# Patient Record
Sex: Female | Born: 2000 | Race: Black or African American | Hispanic: No | Marital: Single | Smoking: Never smoker
Health system: Southern US, Community
[De-identification: ages and names within clinical notes are randomized; demographics above are authoritative.]

## PROBLEM LIST (undated history)

## (undated) DIAGNOSIS — E079 Disorder of thyroid, unspecified: Secondary | ICD-10-CM

## (undated) DIAGNOSIS — D649 Anemia, unspecified: Secondary | ICD-10-CM

## (undated) DIAGNOSIS — R7303 Prediabetes: Secondary | ICD-10-CM

## (undated) DIAGNOSIS — M609 Myositis, unspecified: Secondary | ICD-10-CM

## (undated) DIAGNOSIS — M6282 Rhabdomyolysis: Secondary | ICD-10-CM

## (undated) DIAGNOSIS — J45909 Unspecified asthma, uncomplicated: Secondary | ICD-10-CM

## (undated) HISTORY — DX: Disorder of thyroid, unspecified: E07.9

## (undated) HISTORY — DX: Unspecified asthma, uncomplicated: J45.909

## (undated) HISTORY — DX: Myositis, unspecified: M60.9

## (undated) HISTORY — DX: Prediabetes: R73.03

---
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
## 2019-05-11 DIAGNOSIS — Q6689 Other  specified congenital deformities of feet: Secondary | ICD-10-CM | POA: Insufficient documentation

## 2019-06-28 HISTORY — PX: FOOT SURGERY: SHX648

## 2019-10-12 ENCOUNTER — Other Ambulatory Visit: Payer: Self-pay

## 2019-10-12 ENCOUNTER — Encounter (HOSPITAL_COMMUNITY): Payer: Self-pay | Admitting: Emergency Medicine

## 2019-10-12 ENCOUNTER — Emergency Department (HOSPITAL_COMMUNITY)
Admission: EM | Admit: 2019-10-12 | Discharge: 2019-10-13 | Disposition: A | Discharge status: Home / Self Care | Payer: BLUE CROSS/BLUE SHIELD | Attending: Emergency Medicine | Admitting: Emergency Medicine

## 2019-10-12 DIAGNOSIS — H9202 Otalgia, left ear: Secondary | ICD-10-CM | POA: Diagnosis not present

## 2019-10-12 DIAGNOSIS — Z5321 Procedure and treatment not carried out due to patient leaving prior to being seen by health care provider: Secondary | ICD-10-CM | POA: Insufficient documentation

## 2019-10-12 NOTE — ED Triage Notes (Signed)
Pt c/o left ear pain x 1 week. Pt states she thinks it's an ear infection or sinus congestion. Pt states she took sinus medication with no relief. Describes pain as sharp and radiates down into teeth.

## 2019-10-13 NOTE — ED Notes (Signed)
Eloped from waiting area. Called 3X.  

## 2020-02-07 ENCOUNTER — Other Ambulatory Visit: Payer: BLUE CROSS/BLUE SHIELD

## 2020-02-07 DIAGNOSIS — Z20822 Contact with and (suspected) exposure to covid-19: Secondary | ICD-10-CM

## 2020-02-09 LAB — SARS-COV-2, NAA 2 DAY TAT

## 2020-02-09 LAB — NOVEL CORONAVIRUS, NAA: SARS-CoV-2, NAA: NOT DETECTED

## 2020-03-29 NOTE — L&D Delivery Note (Signed)
OB/[facility]N Faculty Practice Delivery Note  Rachael Patton is a 20 y.o. G1P0 s/p SVD at [redacted]w[redacted]d. She was admitted for induction of labor for T2DM on insulin.   ROM: 4h 75m with clear fluid GBS Status: GBS positive- adequately treated with PCN Maximum Maternal Temperature: 98.4 degrees Fahrenheit  Labor Progress: Patient SROM'd at 1430, and progressed to 5/90/-1. She then got an epidural and on repeat check found to be 9.5/90/0 station. She was allowed to labor down and then found to be complete and started pushing. She was noted to have several mild range blood pressures after epidural, and pre-E labs were ordered.  Delivery Date/Time: 03/16/21 at 1849 Delivery: Called to room and patient was complete and pushing. Head delivered LOA. Loose nuchal cord present and delivered through. Shoulder and body delivered in usual fashion. Infant with spontaneous cry, placed on mother's abdomen, dried and stimulated. Upon delivery of infant, left extremity noted to have knee and foot facing dorsally. Discussed with parents to discuss with pediatrician and may consider peds orthopedic consult. Cord clamped x 2 after 1-minute delay, and cut by father of baby under my direct supervision. Cord blood drawn. Placenta delivered spontaneously with gentle cord traction. Fundus firm with massage and Pitocin. Labia, perineum, vagina, and cervix were inspected, second degree perineal repair noted and repaired in the normal fashion with a 3-0 Vicryl.   Placenta: complete, three vessel cord appreciated Complications: none Lacerations: 2nd degree perineal tear, repaired EBL: 250 mL Analgesia: epidural  Postpartum Planning [x]  message to sent to schedule follow-up  [ ]  vaccines UTD  Infant: viable female infant   APGARs 9,9 at 1 and 5 minutes respectively   weight pending  , [facility]

## 2020-04-30 ENCOUNTER — Ambulatory Visit (INDEPENDENT_AMBULATORY_CARE_PROVIDER_SITE_OTHER): Payer: BLUE CROSS/BLUE SHIELD | Admitting: Physician Assistant

## 2020-04-30 ENCOUNTER — Encounter: Payer: Self-pay | Admitting: Physician Assistant

## 2020-04-30 ENCOUNTER — Other Ambulatory Visit: Payer: Self-pay

## 2020-04-30 VITALS — BP 120/68 | HR 107 | Temp 98.5°F | Ht 65.0 in | Wt 264.5 lb

## 2020-04-30 DIAGNOSIS — R7989 Other specified abnormal findings of blood chemistry: Secondary | ICD-10-CM | POA: Insufficient documentation

## 2020-04-30 DIAGNOSIS — B159 Hepatitis A without hepatic coma: Secondary | ICD-10-CM

## 2020-04-30 DIAGNOSIS — R29898 Other symptoms and signs involving the musculoskeletal system: Secondary | ICD-10-CM | POA: Diagnosis not present

## 2020-04-30 DIAGNOSIS — B349 Viral infection, unspecified: Secondary | ICD-10-CM | POA: Diagnosis not present

## 2020-04-30 DIAGNOSIS — R21 Rash and other nonspecific skin eruption: Secondary | ICD-10-CM | POA: Diagnosis not present

## 2020-04-30 DIAGNOSIS — D509 Iron deficiency anemia, unspecified: Secondary | ICD-10-CM | POA: Insufficient documentation

## 2020-04-30 DIAGNOSIS — R7303 Prediabetes: Secondary | ICD-10-CM | POA: Insufficient documentation

## 2020-04-30 DIAGNOSIS — E039 Hypothyroidism, unspecified: Secondary | ICD-10-CM | POA: Insufficient documentation

## 2020-04-30 DIAGNOSIS — D8989 Other specified disorders involving the immune mechanism, not elsewhere classified: Secondary | ICD-10-CM

## 2020-04-30 MED ORDER — TRIAMCINOLONE ACETONIDE 0.1 % EX CREA: TOPICAL_CREAM | CUTANEOUS | 0 refills | Status: DC

## 2020-04-30 NOTE — Patient Instructions (Signed)
It was great to see you!  Update blood work today.  Trial the cream for your rash. If no improvement in 1-2 weeks, please come back and see me and we will do biopsy.  I will be in touch with your blood work and recommendations.  Avoid alcohol and any acetaminophen products.  Let's follow-up in 1-2 weeks, sooner if you have concerns.  Take care,  [name] PA-C

## 2020-04-30 NOTE — Progress Notes (Addendum)
Rachael Patton is a 20 y.o. female is here to establish care and discuss leg weakness.  I acted as a Neurosurgeon for [employer], PA-C Corky Mull, LPN   History of Present Illness:   Chief Complaint  Patient presents with   Establish Care   Extremity Weakness    HPI   Pt here to establish care today.  Leg weakness Pt c/o bilateral leg weakness x 2 weeks. Pt had COVID beginning of Jan. Denies numbness or tingling. Weakness in upper thighs. Appetite is back to normal from her recent GI illness. Denies trauma or falls.   Autoimmune disorder She tells me that when she was a young child/early teen she had positive autoimmune testing. She saw a rheumatologist and was told that she might have a unspecified autoimmune disorder. She is unable to really provide much more hx.   Recent GI illness Mid December 2021 she developed fatigue, diarrhea, sweats, dry cough. She went to urgent care in early January 2022 for evaluation. Abd xray normal. Stool studies negative. WBC was 15.9. AST was 211 and ALT was 171. Hepatitis A IgG and IgM antibody was reactive on her hepatitis panel. She was also COVID positive.  She reports that she was never told about potentially having Hep A.  GI symptoms have resolved. Endorses very rare alcohol intake. Was taking Alka Seltzer Cold and Flu (250 mg of acetaminophen) per tablet BID x 2 weeks. She has stopped this after COVID symptoms resolve.  Did have some follow-up labs done at endocrinology. 04/21/20 AST: 251 ALT: 176    Rash Has new b/l rash to inner arms. Area is not itchy or painful. Has hx of eczema but this is different. Has not tried anything for this. No areas are open or oozing. Getting worse with time.   Health Maintenance Due  Topic Date Due   Hepatitis C Screening  Never done   HIV Screening  Never done   TETANUS/TDAP  Never done   PAP-Cervical Cytology Screening  Never done   PAP SMEAR-Modifier  Never done    Past  Medical History:  Diagnosis Date   Prediabetes    Thyroid disease    found at age 20     Social History   Tobacco Use   Smoking status: Never Smoker   Smokeless tobacco: Never Used  Vaping Use   Vaping Use: Never used  Substance Use Topics   Alcohol use: Yes    Comment: once a month   Drug use: Never    Past Surgical History:  Procedure Laterality Date   FOOT SURGERY Left 06/2019    History reviewed. No pertinent family history.  PMHx, SurgHx, SocialHx, FamHx, Medications, and Allergies were reviewed in the Visit Navigator and updated as appropriate.   Patient Active Problem List   Diagnosis Date Noted   Morbid obesity (HCC) 04/30/2020   Prolactin increased 04/30/2020   Hypothyroidism 04/30/2020   Prediabetes 04/30/2020   Iron deficiency anemia 04/30/2020   Elevated LFTs 04/30/2020    Social History   Tobacco Use   Smoking status: Never Smoker   Smokeless tobacco: Never Used  Vaping Use   Vaping Use: Never used  Substance Use Topics   Alcohol use: Yes    Comment: once a month   Drug use: Never    Current Medications and Allergies:    Current Outpatient Medications:    ferrous gluconate (FERGON) 324 MG tablet, Take 324 mg by mouth every morning., Disp: , Rfl:  levothyroxine (SYNTHROID) 50 MCG tablet, Take 50 mcg by mouth daily before breakfast., Disp: , Rfl:    triamcinolone (KENALOG) 0.1 %, Apply to affected area 1-2 times daily, Disp: 30 g, Rfl: 0  No Known Allergies  Review of Systems   ROS Negative unless otherwise specified per HPI.  Vitals:   Vitals:   04/30/20 1434  BP: 120/68  Pulse: (!) 107  Temp: 98.5 F (36.9 C)  TempSrc: Temporal  SpO2: 96%  Weight: 264 lb 8 oz (120 kg)  Height: 5\' 5"  (1.651 m)     Body mass index is 44.02 kg/m.   Physical Exam:    Physical Exam Vitals and nursing note reviewed.  Constitutional:      General: She is not in acute distress.    Appearance: She is well-developed.  She is not ill-appearing, toxic-appearing or sickly-appearing.  Cardiovascular:     Rate and Rhythm: Normal rate and regular rhythm.     Pulses: Normal pulses.     Heart sounds: Normal heart sounds, S1 normal and S2 normal.     Comments: No LE edema Pulmonary:     Effort: Pulmonary effort is normal.     Breath sounds: Normal breath sounds.  Skin:    General: Skin is warm, dry and intact.     Comments: Bilateral inner arms with numerous scattered hyperpigmented papules  Neurological:     Mental Status: She is alert.     GCS: GCS eye subscore is 4. GCS verbal subscore is 5. GCS motor subscore is 6.  Psychiatric:        Mood and Affect: Mood and affect normal.        Speech: Speech normal.        Behavior: Behavior normal. Behavior is cooperative.    Results for orders placed or performed in visit on 04/30/20  Comprehensive metabolic panel  Result Value Ref Range   Sodium 135 135 - 145 mEq/L   Potassium 4.3 3.5 - 5.1 mEq/L   Chloride 103 96 - 112 mEq/L   CO2 29 19 - 32 mEq/L   Glucose, Bld 116 (H) 70 - 99 mg/dL   BUN 8 6 - 23 mg/dL   Creatinine, Ser 06/28/20 0.40 - 1.20 mg/dL   Total Bilirubin 0.4 0.2 - 1.2 mg/dL   Alkaline Phosphatase 64 39 - 117 U/L   AST 273 (H) 0 - 37 U/L   ALT 175 (H) 0 - 35 U/L   Total Protein 7.9 6.0 - 8.3 g/dL   Albumin 3.2 (L) 3.5 - 5.2 g/dL   GFR 9.50 932.67 mL/min   Calcium 8.9 8.4 - 10.5 mg/dL  CBC with Differential/Platelet  Result Value Ref Range   WBC 14.6 (H) 4.0 - 10.5 K/uL   RBC 4.47 3.87 - 5.11 Mil/uL   Hemoglobin 11.3 (L) 12.0 - 15.0 g/dL   HCT >12.45 (L) 80.9 - 98.3 %   MCV 79.5 78.0 - 100.0 fl   MCHC 31.6 30.0 - 36.0 g/dL   RDW 38.2 (H) 50.5 - 39.7 %   Platelets 362.0 150.0 - 400.0 K/uL   Neutrophils Relative % 78.2 (H) 43.0 - 77.0 %   Lymphocytes Relative 8.6 (L) 12.0 - 46.0 %   Monocytes Relative 4.5 3.0 - 12.0 %   Eosinophils Relative 8.1 (H) 0.0 - 5.0 %   Basophils Relative 0.6 0.0 - 3.0 %   Neutro Abs 11.4 (H) 1.4 - 7.7 K/uL    Lymphs Abs 1.3 0.7 - 4.0 K/uL  Monocytes Absolute 0.7 0.1 - 1.0 K/uL   Eosinophils Absolute 1.2 (H) 0.0 - 0.7 K/uL   Basophils Absolute 0.1 0.0 - 0.1 K/uL  Sedimentation rate  Result Value Ref Range   Sed Rate 60 (H) 0 - 20 mm/hr  C-reactive protein  Result Value Ref Range   CRP 5.1 0.5 - 20.0 mg/dL  Rheumatoid factor  Result Value Ref Range   Rhuematoid fact SerPl-aCnc <14 <14 IU/mL  CK (Creatine Kinase)  Result Value Ref Range   Total CK 9,940 (H) 7 - 177 U/L  Magnesium  Result Value Ref Range   Magnesium 2.0 1.5 - 2.5 mg/dL     Assessment and Plan:    Chiara was seen today for establish care and extremity weakness.  Diagnoses and all orders for this visit:  Elevated LFTs; Bilateral leg weakness; Viral illness Labs obtained and reviewed with patient, and my supervising MD, Jacquiline Doe. Due to significant elevation of CK, recommend ER evaluation and management for aggressive IVF resuscitation. Patient was contacted by our staff and is agreeable to going at this time. Will follow-up with patient once discharged for ongoing care. -     Comprehensive metabolic panel -     CBC with Differential/Platelet -     Sedimentation rate -     C-reactive protein -     Rheumatoid factor -     ANA -     CK (Creatine Kinase) -     Magnesium  Autoimmune disorder (HCC) Still awaiting all results. Likely refer to rheumatology for further evaluation after ER visit if indicated.  Rash Provided her with rx for topical triamcinolone to affected area. Consider biopsy if no improvement or if worsening.  Other orders -     triamcinolone (KENALOG) 0.1 %; Apply to affected area 1-2 times daily  CMA or LPN served as scribe during this visit. History, Physical, and Plan performed by medical provider. The above documentation has been reviewed and is accurate and complete.  Time spent with patient today was 50 minutes which consisted of chart review, discussing diagnosis, work up,  treatment answering questions and documentation.  [name], PA-C [place], [place] 05/01/2020  Follow-up: No follow-ups on file.

## 2020-05-01 ENCOUNTER — Encounter (HOSPITAL_COMMUNITY): Payer: Self-pay | Admitting: Emergency Medicine

## 2020-05-01 ENCOUNTER — Observation Stay (HOSPITAL_COMMUNITY): Payer: BLUE CROSS/BLUE SHIELD

## 2020-05-01 ENCOUNTER — Inpatient Hospital Stay (HOSPITAL_COMMUNITY)
Admission: EM | Admit: 2020-05-01 | Discharge: 2020-05-05 | DRG: 558 | Disposition: A | Discharge status: Home / Self Care | Payer: BLUE CROSS/BLUE SHIELD | Attending: Internal Medicine | Admitting: Internal Medicine

## 2020-05-01 ENCOUNTER — Encounter: Payer: Self-pay | Admitting: Physician Assistant

## 2020-05-01 DIAGNOSIS — M6282 Rhabdomyolysis: Principal | ICD-10-CM | POA: Diagnosis present

## 2020-05-01 DIAGNOSIS — R7401 Elevation of levels of liver transaminase levels: Secondary | ICD-10-CM

## 2020-05-01 DIAGNOSIS — Z8616 Personal history of COVID-19: Secondary | ICD-10-CM

## 2020-05-01 DIAGNOSIS — Z6841 Body Mass Index (BMI) 40.0 and over, adult: Secondary | ICD-10-CM

## 2020-05-01 DIAGNOSIS — M6281 Muscle weakness (generalized): Secondary | ICD-10-CM | POA: Diagnosis present

## 2020-05-01 DIAGNOSIS — B1920 Unspecified viral hepatitis C without hepatic coma: Secondary | ICD-10-CM | POA: Diagnosis present

## 2020-05-01 DIAGNOSIS — E039 Hypothyroidism, unspecified: Secondary | ICD-10-CM | POA: Diagnosis present

## 2020-05-01 DIAGNOSIS — R748 Abnormal levels of other serum enzymes: Secondary | ICD-10-CM | POA: Diagnosis not present

## 2020-05-01 DIAGNOSIS — I959 Hypotension, unspecified: Secondary | ICD-10-CM | POA: Diagnosis present

## 2020-05-01 DIAGNOSIS — R7303 Prediabetes: Secondary | ICD-10-CM | POA: Diagnosis present

## 2020-05-01 DIAGNOSIS — R7989 Other specified abnormal findings of blood chemistry: Secondary | ICD-10-CM | POA: Diagnosis present

## 2020-05-01 DIAGNOSIS — U071 COVID-19: Secondary | ICD-10-CM | POA: Diagnosis present

## 2020-05-01 DIAGNOSIS — Z833 Family history of diabetes mellitus: Secondary | ICD-10-CM

## 2020-05-01 DIAGNOSIS — D509 Iron deficiency anemia, unspecified: Secondary | ICD-10-CM | POA: Diagnosis present

## 2020-05-01 DIAGNOSIS — Z79890 Hormone replacement therapy (postmenopausal): Secondary | ICD-10-CM

## 2020-05-01 DIAGNOSIS — R21 Rash and other nonspecific skin eruption: Secondary | ICD-10-CM | POA: Diagnosis present

## 2020-05-01 LAB — URINALYSIS, ROUTINE W REFLEX MICROSCOPIC
Bilirubin Urine: NEGATIVE
Glucose, UA: NEGATIVE mg/dL
Ketones, ur: 5 mg/dL — AB
Leukocytes,Ua: NEGATIVE
Nitrite: NEGATIVE
Protein, ur: 30 mg/dL — AB
Specific Gravity, Urine: 1.018 (ref 1.005–1.030)
pH: 6 (ref 5.0–8.0)

## 2020-05-01 LAB — CBC
HCT: 39.8 % (ref 36.0–46.0)
Hemoglobin: 12.1 g/dL (ref 12.0–15.0)
MCH: 24.6 pg — ABNORMAL LOW (ref 26.0–34.0)
MCHC: 30.4 g/dL (ref 30.0–36.0)
MCV: 80.9 fL (ref 80.0–100.0)
Platelets: 392 10*3/uL (ref 150–400)
RBC: 4.92 MIL/uL (ref 3.87–5.11)
RDW: 16.6 % — ABNORMAL HIGH (ref 11.5–15.5)
WBC: 14.7 10*3/uL — ABNORMAL HIGH (ref 4.0–10.5)
nRBC: 0 % (ref 0.0–0.2)

## 2020-05-01 LAB — CBG MONITORING, ED: Glucose-Capillary: 84 mg/dL (ref 70–99)

## 2020-05-01 LAB — CK
Total CK: 9940 U/L — ABNORMAL HIGH (ref 7–177)
Total CK: 11796 U/L — ABNORMAL HIGH (ref 38–234)

## 2020-05-01 LAB — CBC WITH DIFFERENTIAL/PLATELET
Basophils Absolute: 0.1 10*3/uL (ref 0.0–0.1)
Basophils Relative: 0.6 % (ref 0.0–3.0)
Eosinophils Absolute: 1.2 10*3/uL — ABNORMAL HIGH (ref 0.0–0.7)
Eosinophils Relative: 8.1 % — ABNORMAL HIGH (ref 0.0–5.0)
HCT: 35.6 % — ABNORMAL LOW (ref 36.0–46.0)
Hemoglobin: 11.3 g/dL — ABNORMAL LOW (ref 12.0–15.0)
Lymphocytes Relative: 8.6 % — ABNORMAL LOW (ref 12.0–46.0)
Lymphs Abs: 1.3 10*3/uL (ref 0.7–4.0)
MCHC: 31.6 g/dL (ref 30.0–36.0)
MCV: 79.5 fl (ref 78.0–100.0)
Monocytes Absolute: 0.7 10*3/uL (ref 0.1–1.0)
Monocytes Relative: 4.5 % (ref 3.0–12.0)
Neutro Abs: 11.4 10*3/uL — ABNORMAL HIGH (ref 1.4–7.7)
Neutrophils Relative %: 78.2 % — ABNORMAL HIGH (ref 43.0–77.0)
Platelets: 362 10*3/uL (ref 150.0–400.0)
RBC: 4.47 Mil/uL (ref 3.87–5.11)
RDW: 17.2 % — ABNORMAL HIGH (ref 11.5–15.5)
WBC: 14.6 10*3/uL — ABNORMAL HIGH (ref 4.0–10.5)

## 2020-05-01 LAB — LIPASE, BLOOD: Lipase: 16 U/L (ref 11–51)

## 2020-05-01 LAB — HEPATIC FUNCTION PANEL
ALT: 187 U/L — ABNORMAL HIGH (ref 0–44)
AST: 305 U/L — ABNORMAL HIGH (ref 15–41)
Albumin: 2.9 g/dL — ABNORMAL LOW (ref 3.5–5.0)
Alkaline Phosphatase: 62 U/L (ref 38–126)
Bilirubin, Direct: <0.1 mg/dL (ref 0.0–0.2)
Total Bilirubin: 0.8 mg/dL (ref 0.3–1.2)
Total Protein: 7.9 g/dL (ref 6.5–8.1)

## 2020-05-01 LAB — C-REACTIVE PROTEIN: CRP: 5.1 mg/dL (ref 0.5–20.0)

## 2020-05-01 LAB — BASIC METABOLIC PANEL
Anion gap: 9 (ref 5–15)
BUN: 6 mg/dL (ref 6–20)
CO2: 24 mmol/L (ref 22–32)
Calcium: 8.6 mg/dL — ABNORMAL LOW (ref 8.9–10.3)
Chloride: 102 mmol/L (ref 98–111)
Creatinine, Ser: 0.67 mg/dL (ref 0.44–1.00)
GFR, Estimated: >60 mL/min (ref >60)
Glucose, Bld: 121 mg/dL — ABNORMAL HIGH (ref 70–99)
Potassium: 4.3 mmol/L (ref 3.5–5.1)
Sodium: 135 mmol/L (ref 135–145)

## 2020-05-01 LAB — I-STAT BETA HCG BLOOD, ED (MC, WL, AP ONLY): I-stat hCG, quantitative: <5 m[IU]/mL (ref <5)

## 2020-05-01 LAB — COMPREHENSIVE METABOLIC PANEL
ALT: 175 U/L — ABNORMAL HIGH (ref 0–35)
AST: 273 U/L — ABNORMAL HIGH (ref 0–37)
Albumin: 3.2 g/dL — ABNORMAL LOW (ref 3.5–5.2)
Alkaline Phosphatase: 64 U/L (ref 39–117)
BUN: 8 mg/dL (ref 6–23)
CO2: 29 mEq/L (ref 19–32)
Calcium: 8.9 mg/dL (ref 8.4–10.5)
Chloride: 103 mEq/L (ref 96–112)
Creatinine, Ser: 0.66 mg/dL (ref 0.40–1.20)
GFR: 125.13 mL/min (ref >60.00)
Glucose, Bld: 116 mg/dL — ABNORMAL HIGH (ref 70–99)
Potassium: 4.3 mEq/L (ref 3.5–5.1)
Sodium: 135 mEq/L (ref 135–145)
Total Bilirubin: 0.4 mg/dL (ref 0.2–1.2)
Total Protein: 7.9 g/dL (ref 6.0–8.3)

## 2020-05-01 LAB — PROTIME-INR
INR: 1.1 (ref 0.8–1.2)
Prothrombin Time: 14.2 seconds (ref 11.4–15.2)

## 2020-05-01 LAB — MAGNESIUM: Magnesium: 2 mg/dL (ref 1.5–2.5)

## 2020-05-01 LAB — SEDIMENTATION RATE: Sed Rate: 60 mm/hr — ABNORMAL HIGH (ref 0–20)

## 2020-05-01 MED ORDER — SODIUM CHLORIDE 0.9 % IV SOLN: INTRAVENOUS | Status: DC

## 2020-05-01 MED ORDER — TRIAMCINOLONE ACETONIDE 0.1 % EX CREA
TOPICAL_CREAM | Freq: Two times a day (BID) | CUTANEOUS | Status: DC
  Filled 2020-05-01 (×2): qty 15

## 2020-05-01 MED ORDER — ONDANSETRON HCL 4 MG PO TABS: 4.0000 mg | ORAL_TABLET | Freq: Four times a day (QID) | ORAL | Status: DC | PRN

## 2020-05-01 MED ORDER — ONDANSETRON HCL 4 MG/2ML IJ SOLN: 4.0000 mg | Freq: Four times a day (QID) | INTRAMUSCULAR | Status: DC | PRN

## 2020-05-01 MED ORDER — FERROUS GLUCONATE 324 (38 FE) MG PO TABS
324.0000 mg | ORAL_TABLET | Freq: Every morning | ORAL | Status: DC
  Administered 2020-05-02 – 2020-05-05 (×4): 324 mg via ORAL
  Filled 2020-05-01 (×4): qty 1

## 2020-05-01 MED ORDER — ACETAMINOPHEN 325 MG PO TABS: 650.0000 mg | ORAL_TABLET | Freq: Four times a day (QID) | ORAL | Status: DC | PRN

## 2020-05-01 MED ORDER — ENOXAPARIN SODIUM 60 MG/0.6ML ~~LOC~~ SOLN
60.0000 mg | SUBCUTANEOUS | Status: DC
  Administered 2020-05-02 – 2020-05-04 (×4): 60 mg via SUBCUTANEOUS
  Filled 2020-05-01 (×6): qty 0.6

## 2020-05-01 MED ORDER — PREDNISONE 20 MG PO TABS
60.0000 mg | ORAL_TABLET | Freq: Every day | ORAL | Status: DC
  Administered 2020-05-01: 60 mg via ORAL
  Filled 2020-05-01: qty 3

## 2020-05-01 MED ORDER — SODIUM CHLORIDE 0.9 % IV BOLUS
1000.0000 mL | Freq: Once | INTRAVENOUS | Status: AC
  Administered 2020-05-01: 1000 mL via INTRAVENOUS

## 2020-05-01 MED ORDER — ACETAMINOPHEN 650 MG RE SUPP: 650.0000 mg | Freq: Four times a day (QID) | RECTAL | Status: DC | PRN

## 2020-05-01 MED ORDER — LEVOTHYROXINE SODIUM 50 MCG PO TABS: 50.0000 ug | ORAL_TABLET | Freq: Every day | ORAL | Status: DC

## 2020-05-01 NOTE — ED Notes (Signed)
Happy meal w/ sprite provided

## 2020-05-01 NOTE — ED Provider Notes (Signed)
[facility] EMERGENCY DEPARTMENT Provider Note   CSN: [id number] Arrival date & time: 05/01/20  1502     History Chief Complaint  Patient presents with  . Abnormal Lab    Rachael Patton is a 20 y.o. female past medical history of hypothyroidism, iron deficiency anemia, presenting to the emergency department with abnormal labs done by PCP yesterday.  Patient states symptoms initially began in the beginning of January 2022 with COVID-19 illness.  She states she has had progressively worsening weakness to her proximal legs bilaterally, and somewhat to her proximal bilateral arms.  She feels extreme fatigue.  She has been tolerating p.o. well.  She has not had any fever since Covid, her Covid illness consisted of flulike symptoms that resolved.  During the time of her diagnosis of Covid, she also had hepatitis panel done which showed reactive hep A IgG and IgM.  She had transaminitis at that time as well.    PCP yesterday rechecked LFTs and inflammatory markers.  Per review of medical record, CK yesterday was 9940, rheumatoid factor was within normal limits, CRP is within normal limits, sed rate elevated at 60.  LFTs yesterday her AST of 273 and ALT 175.  ANA is still in process.  She is noted to have rash, she states this developed sometime during her Covid illness as well.  Rash has persisted, it is asymptomatic.  It is to her face and bilateral arms.  PCP prescribed her topical steroid cream.  They called her today with abnormal lab results and recommended ED visit for admission for IV fluids.  Reports a few years back she was evaluated by rheumatologist for possible autoimmune disorder, however she states they told her she did not in fact have an autoimmune disorder and she was cleared.  The history is provided by the patient and medical records.       Past Medical History:  Diagnosis Date  . Prediabetes   . Thyroid disease    found at age 82    Patient Active Problem  List   Diagnosis Date Noted  . Morbid obesity ([place]) 04/30/2020  . Prolactin increased 04/30/2020  . Hypothyroidism 04/30/2020  . Prediabetes 04/30/2020  . Iron deficiency anemia 04/30/2020  . Elevated LFTs 04/30/2020    Past Surgical History:  Procedure Laterality Date  . FOOT SURGERY Left 06/2019     OB History   No obstetric history on file.     No family history on file.  Social History   Tobacco Use  . Smoking status: Never Smoker  . Smokeless tobacco: Never Used  Vaping Use  . Vaping Use: Never used  Substance Use Topics  . Alcohol use: Yes    Comment: once a month  . Drug use: Never    Home Medications Prior to Admission medications   Medication Sig Start Date End Date Taking? Authorizing Provider  ferrous gluconate (FERGON) 324 MG tablet Take 324 mg by mouth every morning. Patient not taking: Reported on 05/01/2020 04/20/20   [provider]  levothyroxine (SYNTHROID) 50 MCG tablet Take 50 mcg by mouth daily before breakfast. Patient not taking: Reported on 05/01/2020    [provider]  triamcinolone (KENALOG) 0.1 % Apply to affected area 1-2 times daily Patient not taking: Reported on 05/01/2020 04/30/20   Inda Coke, PA    Allergies    Patient has no known allergies.  Review of Systems   Review of Systems  Constitutional: Positive for fatigue.  Respiratory: Negative  for shortness of breath.   Gastrointestinal: Negative for diarrhea and vomiting.  Skin: Positive for rash.  Neurological: Positive for weakness.  All other systems reviewed and are negative.   Physical Exam Updated Vital Signs BP (!) 92/57   Pulse 95   Temp 98.4 F (36.9 C) (Oral)   Resp (!) 29   Ht _0  (1.651 m)   Wt 119.7 kg   LMP 04/04/2020   SpO2 100%   BMI 43.93 kg/m   Physical Exam Vitals and nursing note reviewed.  Constitutional:      General: She is not in acute distress.    Appearance: She is well-developed and well-nourished. She is obese.   HENT:     Head: Normocephalic and atraumatic.     Mouth/Throat:     Mouth: Mucous membranes are moist.  Eyes:     Conjunctiva/sclera: Conjunctivae normal.  Cardiovascular:     Rate and Rhythm: Normal rate and regular rhythm.  Pulmonary:     Effort: Pulmonary effort is normal. No respiratory distress.     Breath sounds: Normal breath sounds.  Abdominal:     General: Bowel sounds are normal.     Palpations: Abdomen is soft.     Tenderness: There is no abdominal tenderness.  Musculoskeletal:     Comments: No tenderness to the large muscle groups. Trace edema to bilateral lower extremities (patient reports chronic swelling to left leg after surgery)  Skin:    General: Skin is warm.     Comments: There is hyperpigmented scattered raised papules to the medial aspect of bilateral arms with a central pinpoint scab located around the elbow joint.  No tenderness.  No petechia or purpura. Similar appearing more fine rash to the periorbital region, forehead and cheeks.  Neurological:     Mental Status: She is alert.     Comments: Patient is able to lift her legs from the bed on strength examination.  She has normal distal sensation. She has strong grip strength to bilateral upper extremities.  Psychiatric:        Mood and Affect: Mood and affect normal.        Behavior: Behavior normal.     ED Results / Procedures / Treatments   Labs (all labs ordered are listed, but only abnormal results are displayed) Labs Reviewed  BASIC METABOLIC PANEL - Abnormal; Notable for the following components:      Result Value   Glucose, Bld 121 (*)    Calcium 8.6 (*)    All other components within normal limits  CBC - Abnormal; Notable for the following components:   WBC 14.7 (*)    MCH 24.6 (*)    RDW 16.6 (*)    All other components within normal limits  URINALYSIS, ROUTINE W REFLEX MICROSCOPIC - Abnormal; Notable for the following components:   Hgb urine dipstick LARGE (*)    Ketones, ur 5 (*)     Protein, ur 30 (*)    Bacteria, UA RARE (*)    All other components within normal limits  HEPATIC FUNCTION PANEL - Abnormal; Notable for the following components:   Albumin 2.9 (*)    AST 305 (*)    ALT 187 (*)    All other components within normal limits  CK - Abnormal; Notable for the following components:   Total CK 11,796 (*)    All other components within normal limits  LIPASE, BLOOD  PROTIME-INR  CBG MONITORING, ED  I-STAT BETA HCG BLOOD, ED (  [facility], [facility], [facility] ONLY)    EKG None  Radiology No results found.  Procedures Procedures   Medications Ordered in ED Medications  sodium chloride 0.9 % bolus 1,000 mL (1,000 mLs Intravenous New Bag/Given 05/01/20 1906)    ED Course  I have reviewed the triage vital signs and the nursing notes.  Pertinent labs & imaging results that were available during my care of the patient were reviewed by me and considered in my medical decision making (see chart for details).  Clinical Course as of 05/01/20 1912  Thu May 01, 2020  1850 Dr. Posey Pronto is accepting admission. [JR]    Clinical Course User Index [JR] Keyshon Stein, [place] N, PA-C   MDM Rules/Calculators/A&P                          Patient here with progressively worsening proximal muscle weakness mostly to the legs though does involve the arms after COVID-19 illness the beginning of January.  She also is noted to have a rash that developed around the same time that is asymptomatic to medial aspect of bilateral arms about the elbow joint and the upper portion of the face.  She had LFT elevations at the time of her COVID-19 diagnosis and was also noted to have reactive hep A IgG and IgM.  No GI upset.  PCP evaluated her yesterday and noted CK of 9900, elevated ESR, persistently elevated LFTs.  On examination she is noted to have weakness to the legs more so than the arms.  Is unable to lift her legs off the bed due to weakness.  No tenderness to large muscle groups, no large extremity  swelling.  Rash appears to be hyperpigmented scattered papules, no petechia or bulla.  Consider complication after [id number] illness versus rhabdomyolysis versus autoimmune disorder versus neurologic disorder, considered myasthenia gravis versus dermatomyositis.  Today she has some mild hypotension 92/55, treated with IV fluids.  LFTs remain elevated, white count is 14.7.  PT/INR is pending.  Normal renal function.  UA with large hemoglobin.  CK today is 11,796.  Patient will be admitted to the medicine service for further workup.  Final Clinical Impression(s) / ED Diagnoses Final diagnoses:  Proximal muscle weakness  Elevated CK  Transaminitis    Rx / DC Orders ED Discharge Orders    None       Verena Shawgo, [place] N, PA-C 05/01/20 [place], [place], DO 05/01/20 1948

## 2020-05-01 NOTE — H&P (Signed)
History and Physical    Rachael Patton KEU:[id number] DOB: [birth year] DOA: 05/01/2020  PCP: [name], PA  Patient coming from: Home  I have personally briefly reviewed patient's old medical records in [facility]  Chief Complaint: Abnormal labs  HPI: Rachael Patton is a 20 y.o. female with medical history significant for morbid obesity who presents to the ED for evaluation of abnormal labs.  Patient had recent COVID-19 infection with positive test on 04/03/2020.  She was seen in urgent care but did not require hospitalization.  She was treated with azithromycin and supportive care.  She was noted to have mildly elevated AST 211 and ALT 171.  Further labs on 04/04/2020 showed reactive hepatitis a IgG antibody with nonreactive IgM antibody.  Iron was also low at 30 mcg/dL.  She had follow-up visit with her PCP yesterday (04/30/2020) complaining of bilateral leg weakness ongoing for 2 weeks.  She was also noted to have a new rash to her inner arms.  Follow-up labs were obtained and notable for elevated CK 9940, ESR 60, CRP 5.1, WBC 14.6, AST 273, ALT 175.  CRP and rheumatoid factor were reassuring.  ANA obtained and pending.  Patient was advised to present to the ED for further evaluation of elevated CK.  Patient notes that weakness is primarily in her proximal legs and proximal upper extremities.  She has cramping sensations in the same area.  Symptoms are most noticeable when she attempted to stand up or after walking for long period of time.  She reports feeling short of breath after walking up a flight of stairs but denies dyspnea at rest.  She has mild persistent cough over the last month which is currently nonproductive.  She reports decreased urine output than expected without dysuria or hematuria.  She denies any abdominal pain or diarrhea.  She has noticed a rash appearing as multiple papules in her inner upper arms.  She also noticed darkening of skin around her eyes.  Both these  rashes appear new since the end of last year.  She says about 5 to 6 years ago she was evaluated by rheumatologist for potential autoimmune disorder but says this initial work-up was negative.  She denies any tobacco use.  She reports occasional alcohol use about once per month.  She denies any illicit drug use.  She reports a history of diabetes in her mother and maternal grandmother.  She is not currently taking any medications but has just been prescribed levothyroxine, iron supplement, and topical triamcinolone which she has not been able to pick up yet.  ED Course:  Initial vitals showed BP 106/75, pulse 130, RR 16, temp 98.5 F, SPO2 99% on room air.  While in the ED patient had transient drop in blood pressure to 90s/50s.  Labs show WBC 14.7, hemoglobin 12.1, platelets 392,000, sodium 135, potassium 4.3, bicarb 24, BUN 6, creatinine 0.67, serum glucose 121, AST 305, ALT 187, alk phos 62, total bilirubin 0.8, direct bilirubin <0.1, indirect bilirubin not calculated.  Lipase 16.  CK 11,796.  I-STAT beta-hCG <5.0.  Urinalysis shows 5 ketones, 30 protein, negative nitrites, negative leukocytes, 0-5 RBC/hpf, 0-5 WBC/hpf, rare bacteria on microscopy.  Patient was given 1 L normal saline and the hospitalist service was consulted to admit for further evaluation and management.  Review of Systems: All systems reviewed and are negative except as documented in history of present illness above.   Past Medical History:  Diagnosis Date  . Prediabetes   . Thyroid disease  found at age 15    Past Surgical History:  Procedure Laterality Date  . FOOT SURGERY Left 06/2019    Social History:  reports that she has never smoked. She has never used smokeless tobacco. She reports current alcohol use. She reports that she does not use drugs.  No Known Allergies  No family history on file.   Prior to Admission medications   Medication Sig Start Date End Date Taking? Authorizing Provider   ferrous gluconate (FERGON) 324 MG tablet Take 324 mg by mouth every morning. Patient not taking: Reported on 05/01/2020 04/20/20   [provider]  levothyroxine (SYNTHROID) 50 MCG tablet Take 50 mcg by mouth daily before breakfast. Patient not taking: Reported on 05/01/2020    [provider]  triamcinolone (KENALOG) 0.1 % Apply to affected area 1-2 times daily Patient not taking: Reported on 05/01/2020 04/30/20   Inda Coke, [place]    Physical Exam: Vitals:   05/01/20 1753 05/01/20 1800 05/01/20 1815 05/01/20 1830  BP: (!) 94/46 (!) 92/55 (!) 92/57 (!) 93/53  Pulse: 89 94 95 90  Resp: (!) 25 16 (!) 29 (!) 26  Temp: 98.4 F (36.9 C)     TempSrc: Oral     SpO2: 98% 100% 100% 100%  Weight: 119.7 kg     Height: $Remove'5\' 5"'qWspGqG$  (1.651 m)      Constitutional: Obese woman resting in bed, NAD, calm, comfortable Eyes: PERRL, lids and conjunctivae normal, hyperpigmentation of skin bilateral periorbital area ENMT: Mucous membranes are moist. Posterior pharynx clear of any exudate or lesions.Normal dentition.  Neck: normal, supple, no masses. Respiratory: clear to auscultation bilaterally, no wheezing, no crackles. Normal respiratory effort. No accessory muscle use.  Cardiovascular: Regular rate and rhythm, no murmurs / rubs / gallops. No extremity edema. 2+ pedal pulses. Abdomen: no tenderness, no masses palpated. No hepatosplenomegaly. Bowel sounds positive.  Musculoskeletal: no clubbing / cyanosis. No joint deformity upper and lower extremities.  Slightly weakened hip fixation otherwise good ROM, no contractures. Normal muscle tone.  Skin: Numerous scattered hyperpigmented papules at bilateral inner arms without any open wounds, erythema, or discharge.  Hyperpigmented skin in bilateral periorbital area. Neurologic: CN 2-12 grossly intact. Sensation intact. Strength 4/5 with hip flexion against resistance bilaterally otherwise 5/5 in all other areas. Psychiatric: Normal judgment and  insight. Alert and oriented x 3. Normal mood.   Labs on Admission: I have personally reviewed following labs and imaging studies  CBC: Recent Labs  Lab 04/30/20 1521 05/01/20 1535  WBC 14.6* 14.7*  NEUTROABS 11.4*  --   HGB 11.3* 12.1  HCT 35.6* 39.8  MCV 79.5 80.9  PLT 362.0 409   Basic Metabolic Panel: Recent Labs  Lab 04/30/20 1521 05/01/20 1535  NA 135 135  K 4.3 4.3  CL 103 102  CO2 29 24  GLUCOSE 116* 121*  BUN 8 6  CREATININE 0.66 0.67  CALCIUM 8.9 8.6*  MG 2.0  --    GFR: Estimated Creatinine Clearance: 144.2 mL/min (by C-G formula based on SCr of 0.67 mg/dL). Liver Function Tests: Recent Labs  Lab 04/30/20 1521 05/01/20 1714  AST 273* 305*  ALT 175* 187*  ALKPHOS 64 62  BILITOT 0.4 0.8  PROT 7.9 7.9  ALBUMIN 3.2* 2.9*   Recent Labs  Lab 05/01/20 1714  LIPASE 16   No results for input(s): AMMONIA in the last 168 hours. Coagulation Profile: Recent Labs  Lab 05/01/20 1907  INR 1.1   Cardiac Enzymes: Recent Labs  Lab  04/30/20 1521 05/01/20 1714  CKTOTAL 9,940* 11,796*   BNP (last 3 results) No results for input(s): PROBNP in the last 8760 hours. HbA1C: No results for input(s): HGBA1C in the last 72 hours. CBG: Recent Labs  Lab 05/01/20 1759  GLUCAP 84   Lipid Profile: No results for input(s): CHOL, HDL, LDLCALC, TRIG, CHOLHDL, LDLDIRECT in the last 72 hours. Thyroid Function Tests: No results for input(s): TSH, T4TOTAL, FREET4, T3FREE, THYROIDAB in the last 72 hours. Anemia Panel: No results for input(s): VITAMINB12, FOLATE, FERRITIN, TIBC, IRON, RETICCTPCT in the last 72 hours. Urine analysis:    Component Value Date/Time   COLORURINE YELLOW 05/01/2020 1524   APPEARANCEUR CLEAR 05/01/2020 1524   LABSPEC 1.018 05/01/2020 1524   PHURINE 6.0 05/01/2020 1524   GLUCOSEU NEGATIVE 05/01/2020 1524   HGBUR LARGE (A) 05/01/2020 1524   BILIRUBINUR NEGATIVE 05/01/2020 1524   KETONESUR 5 (A) 05/01/2020 1524   PROTEINUR 30 (A)  05/01/2020 1524   NITRITE NEGATIVE 05/01/2020 1524   LEUKOCYTESUR NEGATIVE 05/01/2020 1524    Radiological Exams on Admission: No results found.  EKG: Not performed.  Assessment/Plan Principal Problem:   Elevated CK Active Problems:   Iron deficiency anemia   Elevated LFTs  Rachael Patton is a 20 y.o. female with medical history significant for morbid obesity who is admitted with elevated CK.  Elevated CK: CK rising to 11,796 suspect related to undiagnosed myopathy/myositis.  Renal function currently stable but at risk for kidney injury.  Patient reports decreased urine output than expected. -Continue IV fluid hydration with NS$RemoveBeforeDE'@150'WatxdDtgBZxaBBa$  mL/hour overnight -Repeat CK level in a.m.  Elevated LFTs: Persistent over the last 2 months.  Labs 04/04/2020 showed reactive hepatitis C IgM/IgG antibodies with separate IgM antibody test nonreactive suggesting immunity without acute infection at that time.  Patient denies significant alcohol use.  She is not having any abdominal pain. -Check RUQ ultrasound -Repeat labs in a.m.  Hypotension: Borderline hypotension while in the ED, asymptomatic.  Continue IV fluids as above.  Proximal muscle weakness: Constellation of symptoms, lab abnormalities (elevated CK, LFTs, ESR), and exam findings (proximal muscle weakness, ?  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 -Start oral prednisone 60 mg daily -Continue IV fluid hydration -PT eval -Recommend outpatient rheumatology evaluation  Hypothyroidism: Continue Synthroid 50 mcg daily.  Leukocytosis: No obvious infectious source.  Possibly reactive.  Continue to monitor.  Iron deficiency anemia: Continue iron supplement.  Recent COVID-19 infection: Positive Covid 19 test 04/03/2020.  Currently asymptomatic and out of isolation window.  Has some dyspnea on exertion therefore will obtain chest x-ray.  DVT prophylaxis: Lovenox Code Status: Full code,  confirmed with patient Family Communication: Discussed with patient's husband at bedside Disposition Plan: From home and likely discharge to home pending improvement in the lab derangements Consults called: None Level of care: Med-Surg Admission status:  Status is: Observation  The patient remains OBS appropriate and will d/c before 2 midnights.  Dispo: The patient is from: Home              Anticipated d/c is to: Home              Anticipated d/c date is: 1 day              Patient currently is not medically stable to d/c.   Difficult to place patient No   Zada Finders MD [facility]  If 7PM-7AM, please contact night-coverage [email or web address]  05/01/2020, 7:53 PM

## 2020-05-01 NOTE — ED Triage Notes (Signed)
Pt arrives to ED after receiving a call from her family medicine PA with whom she established care just yesterday called concerning abnormal labs. Pt has been experiencing generally not feeling well over the last 1-2 months with recent covid in January. She reports some autoimmune disorder in early teens unsure exactly what this was. Pt was sent here due to elevated liver enzymes and elevated CK. She reports she generally feels weak all over and fatigue.

## 2020-05-01 NOTE — ED Notes (Signed)
Patient transported to X-ray 

## 2020-05-02 ENCOUNTER — Encounter (HOSPITAL_COMMUNITY): Payer: Self-pay | Admitting: Internal Medicine

## 2020-05-02 ENCOUNTER — Other Ambulatory Visit: Payer: Self-pay

## 2020-05-02 DIAGNOSIS — M6282 Rhabdomyolysis: Secondary | ICD-10-CM | POA: Diagnosis present

## 2020-05-02 DIAGNOSIS — D509 Iron deficiency anemia, unspecified: Secondary | ICD-10-CM

## 2020-05-02 DIAGNOSIS — Z8616 Personal history of COVID-19: Secondary | ICD-10-CM | POA: Diagnosis not present

## 2020-05-02 DIAGNOSIS — R7989 Other specified abnormal findings of blood chemistry: Secondary | ICD-10-CM | POA: Diagnosis not present

## 2020-05-02 DIAGNOSIS — Z833 Family history of diabetes mellitus: Secondary | ICD-10-CM | POA: Diagnosis not present

## 2020-05-02 DIAGNOSIS — R748 Abnormal levels of other serum enzymes: Secondary | ICD-10-CM | POA: Diagnosis not present

## 2020-05-02 DIAGNOSIS — R7401 Elevation of levels of liver transaminase levels: Secondary | ICD-10-CM

## 2020-05-02 DIAGNOSIS — Z6841 Body Mass Index (BMI) 40.0 and over, adult: Secondary | ICD-10-CM | POA: Diagnosis not present

## 2020-05-02 DIAGNOSIS — E039 Hypothyroidism, unspecified: Secondary | ICD-10-CM | POA: Diagnosis present

## 2020-05-02 DIAGNOSIS — M6281 Muscle weakness (generalized): Secondary | ICD-10-CM

## 2020-05-02 DIAGNOSIS — B1920 Unspecified viral hepatitis C without hepatic coma: Secondary | ICD-10-CM | POA: Diagnosis present

## 2020-05-02 DIAGNOSIS — R21 Rash and other nonspecific skin eruption: Secondary | ICD-10-CM | POA: Diagnosis present

## 2020-05-02 DIAGNOSIS — R7303 Prediabetes: Secondary | ICD-10-CM | POA: Diagnosis present

## 2020-05-02 DIAGNOSIS — Z79890 Hormone replacement therapy (postmenopausal): Secondary | ICD-10-CM | POA: Diagnosis not present

## 2020-05-02 DIAGNOSIS — I959 Hypotension, unspecified: Secondary | ICD-10-CM | POA: Diagnosis present

## 2020-05-02 LAB — COMPREHENSIVE METABOLIC PANEL
ALT: 186 U/L — ABNORMAL HIGH (ref 0–44)
AST: 289 U/L — ABNORMAL HIGH (ref 15–41)
Albumin: 2.9 g/dL — ABNORMAL LOW (ref 3.5–5.0)
Alkaline Phosphatase: 64 U/L (ref 38–126)
Anion gap: 7 (ref 5–15)
BUN: 6 mg/dL (ref 6–20)
CO2: 24 mmol/L (ref 22–32)
Calcium: 8.4 mg/dL — ABNORMAL LOW (ref 8.9–10.3)
Chloride: 105 mmol/L (ref 98–111)
Creatinine, Ser: 0.65 mg/dL (ref 0.44–1.00)
GFR, Estimated: >60 mL/min (ref >60)
Glucose, Bld: 144 mg/dL — ABNORMAL HIGH (ref 70–99)
Potassium: 4.4 mmol/L (ref 3.5–5.1)
Sodium: 136 mmol/L (ref 135–145)
Total Bilirubin: 1.1 mg/dL (ref 0.3–1.2)
Total Protein: 8.3 g/dL — ABNORMAL HIGH (ref 6.5–8.1)

## 2020-05-02 LAB — CK: Total CK: 10575 U/L — ABNORMAL HIGH (ref 38–234)

## 2020-05-02 LAB — HIV ANTIBODY (ROUTINE TESTING W REFLEX): HIV Screen 4th Generation wRfx: NONREACTIVE

## 2020-05-02 LAB — CBC
HCT: 40.3 % (ref 36.0–46.0)
Hemoglobin: 12.2 g/dL (ref 12.0–15.0)
MCH: 24.5 pg — ABNORMAL LOW (ref 26.0–34.0)
MCHC: 30.3 g/dL (ref 30.0–36.0)
MCV: 81.1 fL (ref 80.0–100.0)
Platelets: 404 10*3/uL — ABNORMAL HIGH (ref 150–400)
RBC: 4.97 MIL/uL (ref 3.87–5.11)
RDW: 16.7 % — ABNORMAL HIGH (ref 11.5–15.5)
WBC: 16.4 10*3/uL — ABNORMAL HIGH (ref 4.0–10.5)
nRBC: 0 % (ref 0.0–0.2)

## 2020-05-02 LAB — HEMOGLOBIN A1C
Hgb A1c MFr Bld: 5.8 % — ABNORMAL HIGH (ref 4.8–5.6)
Mean Plasma Glucose: 119.76 mg/dL

## 2020-05-02 LAB — TSH: TSH: 3.741 u[IU]/mL (ref 0.350–4.500)

## 2020-05-02 LAB — SEDIMENTATION RATE: Sed Rate: 32 mm/hr — ABNORMAL HIGH (ref 0–22)

## 2020-05-02 LAB — RHEUMATOID FACTOR: Rheumatoid fact SerPl-aCnc: <14 IU/mL (ref <14)

## 2020-05-02 LAB — ANA: Anti Nuclear Antibody (ANA): NEGATIVE

## 2020-05-02 LAB — C-REACTIVE PROTEIN: CRP: 6.2 mg/dL — ABNORMAL HIGH (ref <1.0)

## 2020-05-02 MED ORDER — SODIUM CHLORIDE 0.9 % IV SOLN: INTRAVENOUS | Status: DC

## 2020-05-02 MED ORDER — PREDNISONE 50 MG PO TABS
80.0000 mg | ORAL_TABLET | Freq: Every day | ORAL | Status: DC
  Administered 2020-05-02 – 2020-05-05 (×4): 80 mg via ORAL
  Filled 2020-05-02: qty 1
  Filled 2020-05-02: qty 4
  Filled 2020-05-02 (×2): qty 1

## 2020-05-02 NOTE — ED Notes (Signed)
Pt independently ambulatory to restroom, denies feeling lightheaded or dizzy Respirations are even and non-labored Skin is warm,dry and intact  Denies pain at this time   Call light within reach, visitor remains at bedside

## 2020-05-02 NOTE — Progress Notes (Addendum)
Pt was seen for evaluation of mobility and tolerating a short walk trip with RW.  Pt then went with PT for a slightly longer walk without AD and was laterally unsteady, although she has become accustomed to this. Follow up with her to see if CIR is possible due to her polymyositis symptoms of weakness proximally and her struggle with stairs which are at her home.  Recommend pt continue PT as an inpt and focus on gentle strengthening and increasing standing tolerances as she can perform.  Will look at AD's as well to see what is most practical.    05/02/20 1400  PT Visit Information  Last PT Received On 05/02/20  Assistance Needed +1  History of Present Illness 20 yo female was brought to ED for progressive weakness and SOB with early Jan Covid dx.  Has elevated CK, rhabdomyolysis, and LE weakness at hips and quads.  Has elevated LFT's, anemia, and difficulty with stairs and longer distances.  PMHx:  fatty liver disease, hepatic steatosis,  thyroid disease, R ankle fracture with ORIF  Precautions  Precautions None;Fall (Fall on stairs)  Precaution Comments fall risk mainly on stairs  Restrictions  Weight Bearing Restrictions No  Home Living  Family/patient expects to be discharged to: Private residence  Living Arrangements Alone;Other (Comment) (sister is in same location but different apt)  Available Help at Discharge Family;Available PRN/intermittently  Type of Home Apartment  Home Access Stairs to enter  Entrance Stairs-Number of Steps 10  Entrance Stairs-Rails Can reach both  Home Layout One level  Home Equipment Electric scooter;Crutches  Additional Comments pt has been walking on stairs slowly with frequent stops to manage the 10 steps  Prior Function  Level of Independence Independent  Comments has not used an AD since her R ankle fracture healed last year  Communication  Communication No difficulties  Pain Assessment  Pain Assessment No/denies pain  Cognition  Arousal/Alertness  Awake/alert  Behavior During Therapy WFL for tasks assessed/performed  Overall Cognitive Status Within Functional Limits for tasks assessed  Upper Extremity Assessment  Upper Extremity Assessment Generalized weakness  Lower Extremity Assessment  Lower Extremity Assessment Generalized weakness  Cervical / Trunk Assessment  Cervical / Trunk Assessment Normal  Bed Mobility  Overal bed mobility Needs Assistance  Bed Mobility Supine to Sit;Sit to Supine  Supine to sit Min assist  Sit to supine Mod assist  General bed mobility comments min to give light support to trunk to sit up and mod to lift legs onto bed after walking  Transfers  Overall transfer level Needs assistance  Equipment used Rolling walker (2 wheeled)  Transfers Sit to/from Stand  Sit to Stand Supervision  General transfer comment supervised for safety with walker if needed  Ambulation/Gait  Ambulation/Gait assistance Min guard  Gait Distance (Feet) 150 Feet  Assistive device 1 person hand held assist  Gait Pattern/deviations Step-to pattern;Step-through pattern;Wide base of support;Drifts right/left;Decreased stride length  General Gait Details mild lateral instability that is better with RW  Gait velocity reduced  Gait velocity interpretation <1.31 ft/sec, indicative of household ambulator  Stairs Yes  Stairs assistance Mod assist  Stair Management One rail Right  Number of Stairs 1  General stair comments moderate exertion and unsteady to climb up and back down  Balance  Overall balance assessment Needs assistance  Sitting-balance support Feet supported  Sitting balance-Leahy Scale Good  Standing balance support Bilateral upper extremity supported;During functional activity  Standing balance-Leahy Scale Fair  General Comments  General comments (  skin integrity, edema, etc.) pt is mildly unstable walking with no AD, but requires help to get up a stair vs stability  Exercises  Exercises Other exercises (hips are 3  to 3+ strength, knees are 4- and otherwise 4+ to5)  PT - End of Session  Equipment Utilized During Treatment Gait belt  Activity Tolerance Patient limited by fatigue;Treatment limited secondary to medical complications (Comment)  Patient left in bed;with call bell/phone within reach;with family/visitor present  Nurse Communication Mobility status  PT Assessment  PT Recommendation/Assessment Patient needs continued PT services  PT Visit Diagnosis Unsteadiness on feet (R26.81);Muscle weakness (generalized) (M62.81);Difficulty in walking, not elsewhere classified (R26.2)  PT Problem List Decreased strength;Decreased range of motion;Decreased activity tolerance;Decreased balance;Decreased mobility;Decreased coordination;Decreased knowledge of use of DME;Obesity  Barriers to Discharge Inaccessible home environment  Barriers to Discharge Comments has stairs and lives alone  PT Plan  PT Frequency (ACUTE ONLY) Min 3X/week  PT Treatment/Interventions (ACUTE ONLY) DME instruction;Gait training;Stair training;Functional mobility training;Therapeutic activities;Therapeutic exercise;Balance training;Neuromuscular re-education;Patient/family education  AM-PAC PT "6 Clicks" Mobility Outcome Measure (Version 2)  Help needed turning from your back to your side while in a flat bed without using bedrails? 4  Help needed moving from lying on your back to sitting on the side of a flat bed without using bedrails? 3  Help needed moving to and from a bed to a chair (including a wheelchair)? 3  Help needed standing up from a chair using your arms (e.g., wheelchair or bedside chair)? 3  Help needed to walk in hospital room? 3  Help needed climbing 3-5 steps with a railing?  2  6 Click Score 18  Consider Recommendation of Discharge To: Home with [facility]  PT Recommendation  Recommendations for Other Services Rehab consult  Follow Up Recommendations CIR  PT equipment None recommended by PT  Individuals Consulted   Consulted and Agree with Results and Recommendations Patient;Family member/caregiver  Family Member Consulted mother  Acute Rehab PT Goals  Patient Stated Goal to feel stronger  PT Goal Formulation With patient/family  Time For Goal Achievement 05/16/20  Potential to Achieve Goals Good  PT Time Calculation  PT Start Time (ACUTE ONLY) 1305  PT Stop Time (ACUTE ONLY) 1336  PT Time Calculation (min) (ACUTE ONLY) 31 min  PT General Charges  $$ ACUTE PT VISIT 1 Visit  PT Evaluation  $PT Eval Moderate Complexity 1 Mod  PT Treatments  $Gait Training 8-22 mins  Written Expression  Dominant Hand Right    Samul Dada, PT MS Acute Rehab Dept. Number: [facility] [id number] and [facility] [phone]

## 2020-05-02 NOTE — ED Notes (Signed)
Unsuccessful attempt to call report 

## 2020-05-02 NOTE — Progress Notes (Signed)
PROGRESS NOTE  Rachael Patton QIH:[id number] DOB: 19-Jan-2001 DOA: 05/01/2020 PCP: [name], PA   LOS: 0 days   Brief Narrative / Interim history: 20 year old female history of morbid obesity who came into the hospital for abnormal labs and muscle weakness.  She recently had COVID-19 infection and tested positive on 04/03/2020.  She did not require hospitalization, was treated conservatively and recovered completely.  She was also noted at that time to have elevated LFTs, and further labs showed hep A IgG antibody and everything else was negative.  Over the last couple of weeks she has developed bilateral leg weakness and upper extremity weakness, this has been going on for 2 weeks.  She also has developed a new rash on the inner side of her forearms and feels like her eyelids and around her eyes skin appears darker.  In the ED she was found to have significantly elevated CK with concern for polymyositis, was placed on steroids, IV fluids and admitted to the hospital  Subjective / 24h Interval events: She is still feeling weak this morning, she is able to ambulate but gets weak after just a few steps.  Denies any muscle pain.  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 -Repeat CK, kidney function tomorrow morning -Started on high-dose steroids 80 mg of prednisone daily -Needs follow-up with rheumatology, placed a call to Dr. 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  This is not pruritic.  Probably benefit from outpatient dermatology evaluation, suspect related to #1  Elevated LFTs -Right upper quadrant ultrasound shows fatty liver disease, this was discussed with the patient.  Possibly  related to #1 as well.  LFTs stable today.  She was advised to avoid alcohol, Tylenol  Iron deficiency anemia -Continue iron supplements  Recent COVID-19 infection -Positive test seen in care everywhere on 1/6.  No need for further precautions, no need for repeat testing since within 90-day window  Scheduled Meds: . enoxaparin (LOVENOX) injection  60 mg Subcutaneous Q24H  . ferrous gluconate  324 mg Oral q morning - 10a  . predniSONE  80 mg Oral Daily  . triamcinolone   Topical BID   Continuous Infusions: . sodium chloride 200 mL/hr at 05/02/20 0735   PRN Meds:.ondansetron **OR** ondansetron (ZOFRAN) IV  Diet Orders (From admission, onward)    Start     Ordered   05/01/20 2018  Diet regular Room service appropriate? Yes; Fluid consistency: Thin  Diet effective now       Question Answer Comment  Room service appropriate? Yes   Fluid consistency: Thin      05/01/20 2022          DVT prophylaxis:      Code Status: Full Code  Family Communication: Significant other at bedside  Status is: Observation  The patient will require care spanning > 2 midnights and should be moved to inpatient because: Inpatient level of care appropriate due to severity of illness  Dispo: The patient is from: Home              Anticipated d/c is to: Home              Anticipated d/c date is: 2 days              Patient  currently is not medically stable to d/c.   Difficult to place patient No   Level of care: Med-Surg  Consultants:  None  Procedures:  None   Microbiology  None   Antimicrobials: None     Objective: Vitals:   05/02/20 0200 05/02/20 0530 05/02/20 0600 05/02/20 0800  BP: 105/62 116/69 109/65 125/82  Pulse: 80 72 81 63  Resp: 20 18  20   Temp:   97.6 F (36.4 C)   TempSrc:   Oral   SpO2: 100% 99% 94% 100%  Weight:      Height:        Intake/Output Summary (Last 24 hours) at 05/02/2020 0958 Last data filed at 05/01/2020 2219 Gross per 24 hour  Intake 1000 ml   Output -  Net 1000 ml   Filed Weights   05/01/20 1509 05/01/20 1753  Weight: 117.9 kg 119.7 kg    Examination:  Constitutional: NAD Eyes: no scleral icterus ENMT: Mucous membranes are moist.  Neck: normal, supple Respiratory: clear to auscultation bilaterally, no wheezing, no crackles. Normal respiratory effort. No accessory muscle use.  Cardiovascular: Regular rate and rhythm, no murmurs / rubs / gallops. No LE edema.  Abdomen: non distended, no tenderness. Bowel sounds positive.  Musculoskeletal: no clubbing / cyanosis.  Skin: no rashes Neurologic: CN 2-12 grossly intact.  5 -/5 proximal muscles of upper and lower extremities. Psychiatric: Normal judgment and insight. Alert and oriented x 3. Normal mood.    Data Reviewed: I have independently reviewed following labs and imaging studies   CBC: Recent Labs  Lab 04/30/20 1521 05/01/20 1535 05/02/20 0230  WBC 14.6* 14.7* 16.4*  NEUTROABS 11.4*  --   --   HGB 11.3* 12.1 12.2  HCT 35.6* 39.8 40.3  MCV 79.5 80.9 81.1  PLT 362.0 392 404*   Basic Metabolic Panel: Recent Labs  Lab 04/30/20 1521 05/01/20 1535 05/02/20 0230  NA 135 135 136  K 4.3 4.3 4.4  CL 103 102 105  CO2 29 24 24   GLUCOSE 116* 121* 144*  BUN 8 6 6   CREATININE 0.66 0.67 0.65  CALCIUM 8.9 8.6* 8.4*  MG 2.0  --   --    Liver Function Tests: Recent Labs  Lab 04/30/20 1521 05/01/20 1714 05/02/20 0230  AST 273* 305* 289*  ALT 175* 187* 186*  ALKPHOS 64 62 64  BILITOT 0.4 0.8 1.1  PROT 7.9 7.9 8.3*  ALBUMIN 3.2* 2.9* 2.9*   Coagulation Profile: Recent Labs  Lab 05/01/20 1907  INR 1.1   HbA1C: Recent Labs    05/02/20 0245  HGBA1C 5.8*   CBG: Recent Labs  Lab 05/01/20 1759  GLUCAP 84    No results found for this or any previous visit (from the past 240 hour(s)).   Radiology Studies: DG Chest 2 View  Result Date: 05/01/2020 CLINICAL DATA:  Elevated LFTs EXAM: CHEST - 2 VIEW COMPARISON:  None. FINDINGS: The heart size and  mediastinal contours are within normal limits. Both lungs are clear. The visualized skeletal structures are unremarkable. IMPRESSION: No active cardiopulmonary disease. Electronically Signed   By: 06/30/20 M.D.   On: 05/01/2020 20:54   06/29/2020 Abdomen Limited RUQ (LIVER/GB)  Result Date: 05/01/2020 CLINICAL DATA:  Increased LFTs EXAM: ULTRASOUND ABDOMEN LIMITED RIGHT UPPER QUADRANT COMPARISON:  None. FINDINGS: Gallbladder: No gallstones or wall thickening visualized. No sonographic Murphy sign noted by sonographer. Common bile duct: Diameter: 2.5 mm Liver: No focal lesion identified. Mildly increased echogenicity seen throughout the liver  parenchyma. Portal vein is patent on color Doppler imaging with normal direction of blood flow towards the liver. Other: None. IMPRESSION: Normal appearing gallbladder. Mild hepatic steatosis Electronically Signed   By: Jonna Clark M.D.   On: 05/01/2020 21:08    Pamella Pert, MD, PhD [facility]  Between 7 am - 7 pm I am available, please contact me via Amion or Securechat  Between 7 pm - 7 am I am not available, please contact night coverage MD/APP via Amion

## 2020-05-02 NOTE — ED Notes (Signed)
MS Breakfast Ordered 

## 2020-05-02 NOTE — ED Notes (Signed)
Up to the br minimal assistance  No pain just tired

## 2020-05-03 DIAGNOSIS — R748 Abnormal levels of other serum enzymes: Secondary | ICD-10-CM | POA: Diagnosis not present

## 2020-05-03 LAB — COMPREHENSIVE METABOLIC PANEL
ALT: 146 U/L — ABNORMAL HIGH (ref 0–44)
AST: 166 U/L — ABNORMAL HIGH (ref 15–41)
Albumin: 2.5 g/dL — ABNORMAL LOW (ref 3.5–5.0)
Alkaline Phosphatase: 46 U/L (ref 38–126)
Anion gap: 7 (ref 5–15)
BUN: 10 mg/dL (ref 6–20)
CO2: 22 mmol/L (ref 22–32)
Calcium: 8.3 mg/dL — ABNORMAL LOW (ref 8.9–10.3)
Chloride: 110 mmol/L (ref 98–111)
Creatinine, Ser: 0.62 mg/dL (ref 0.44–1.00)
GFR, Estimated: >60 mL/min (ref >60)
Glucose, Bld: 136 mg/dL — ABNORMAL HIGH (ref 70–99)
Potassium: 4 mmol/L (ref 3.5–5.1)
Sodium: 139 mmol/L (ref 135–145)
Total Bilirubin: 0.8 mg/dL (ref 0.3–1.2)
Total Protein: 6.6 g/dL (ref 6.5–8.1)

## 2020-05-03 LAB — CBC
HCT: 33.3 % — ABNORMAL LOW (ref 36.0–46.0)
Hemoglobin: 10.2 g/dL — ABNORMAL LOW (ref 12.0–15.0)
MCH: 25.1 pg — ABNORMAL LOW (ref 26.0–34.0)
MCHC: 30.6 g/dL (ref 30.0–36.0)
MCV: 81.8 fL (ref 80.0–100.0)
Platelets: 361 10*3/uL (ref 150–400)
RBC: 4.07 MIL/uL (ref 3.87–5.11)
RDW: 16.6 % — ABNORMAL HIGH (ref 11.5–15.5)
WBC: 17.9 10*3/uL — ABNORMAL HIGH (ref 4.0–10.5)
nRBC: 0 % (ref 0.0–0.2)

## 2020-05-03 LAB — CK: Total CK: 6087 U/L — ABNORMAL HIGH (ref 38–234)

## 2020-05-03 LAB — ALDOLASE: Aldolase: <1.2 U/L — ABNORMAL LOW (ref 3.3–10.3)

## 2020-05-03 NOTE — Progress Notes (Signed)
Inpatient Rehab Admissions Coordinator Note:   Per therapy recommendations, pt was screened for CIR candidacy by Megan Salon, MS CCC-SLP. At this time, Pt. Does not demonstrate need for CIR-level therapies in 2 or more of the following disciplines: PT, OT, and/or SLP. She is also currently ambulating at min guard level. Recommend stair training during Pt.'s acute stay since she has stairs to enter her home. I will not place a CIR consult at this time.  Please contact me with questions.   Megan Salon, MS, CCC-SLP Rehab Admissions Coordinator  [phone] (celll) [phone] (office)

## 2020-05-03 NOTE — Progress Notes (Signed)
PROGRESS NOTE  Rachael Patton EHU:[id number] DOB: Mar 15, 2001 DOA: 05/01/2020 PCP: [name], PA   LOS: 1 day   Brief Narrative / Interim history: 20 year old female history of morbid obesity who came into the hospital for abnormal labs and muscle weakness.  She recently had COVID-19 infection and tested positive on 04/03/2020.  She did not require hospitalization, was treated conservatively and recovered completely.  She was also noted at that time to have elevated LFTs, and further labs showed hep A IgG antibody and everything else was negative.  Over the last couple of weeks she has developed bilateral leg weakness and upper extremity weakness, this has been going on for 2 weeks.  She also has developed a new rash on the inner side of her forearms and feels like her eyelids and around her eyes skin appears darker.  In the ED she was found to have significantly elevated CK with concern for polymyositis, was placed on steroids, IV fluids and admitted to the hospital  Subjective / 24h Interval events: She is still feeling weak this morning, she is able to ambulate but gets weak after just a few steps.  Denies any muscle pain.  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  Continue fluids -Repeat CK, kidney function tomorrow morning -Started on high-dose steroids 80 mg of prednisone daily -Needs follow-up with rheumatology, discussed with Dr. [name] with rheumatology, he will follow-up as an outpatient.  Unclear how to order myositis panel from the inpatient side -PT evaluated patient, she has difficulties navigating stairs, considering CIR.  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  This is not  pruritic.  Probably benefit from outpatient dermatology evaluation, suspect related to #1  Elevated LFTs -Right upper quadrant ultrasound shows fatty liver disease, this was discussed with the patient.  Possibly related to #1 as well.  LFTs stable today.  She was advised to avoid alcohol, Tylenol -LFTs improving today  Iron deficiency anemia -Continue iron supplements  Recent COVID-19 infection -Positive test seen in care everywhere on 1/6.  No need for further precautions, no need for repeat testing since within 90-day window  Scheduled Meds: . enoxaparin (LOVENOX) injection  60 mg Subcutaneous Q24H  . ferrous gluconate  324 mg Oral q morning - 10a  . predniSONE  80 mg Oral Daily  . triamcinolone   Topical BID   Continuous Infusions: . sodium chloride 200 mL/hr at 05/03/20 0958   PRN Meds:.ondansetron **OR** ondansetron (ZOFRAN) IV  Diet Orders (From admission, onward)    Start     Ordered   05/01/20 2018  Diet regular Room service appropriate? Yes; Fluid consistency: Thin  Diet effective now       Question Answer Comment  Room service appropriate? Yes   Fluid consistency: Thin      05/01/20 2022          DVT prophylaxis:      Code Status: Full Code  Family Communication: Significant other at bedside  Status is: Inpatient  The patient will require care spanning > 2 midnights and should be moved to inpatient because: Inpatient level of care appropriate due to severity of illness  Dispo: The patient is from: Home  Anticipated d/c is to: Home              Anticipated d/c date is: 2 days              Patient currently is not medically stable to d/c.   Difficult to place patient No   Level of care: Med-Surg  Consultants:  None  Procedures:  None   Microbiology  None   Antimicrobials: None     Objective: Vitals:   05/02/20 1821 05/02/20 2025 05/03/20 0453 05/03/20 0843  BP: 117/71 115/69 118/71 112/67  Pulse: 77 62 79 82  Resp: 18 20 20  18   Temp: 98.4 F (36.9 C) 98.2 F (36.8 C) 97.7 F (36.5 C) 98 F (36.7 C)  TempSrc: Oral Oral Oral Oral  SpO2: 100% 100% 99% 99%  Weight:      Height:       No intake or output data in the 24 hours ending 05/03/20 1039 Filed Weights   05/01/20 1509 05/01/20 1753  Weight: 117.9 kg 119.7 kg    Examination:  Constitutional: No distress Eyes: No icterus ENMT: mmm Neck: normal, supple Respiratory: cta biL Cardiovascular: rrr, no mrg Abdomen: nd Musculoskeletal: no clubbing / cyanosis.  Skin: macular rash inner UE, non blancing Neurologic: non focal    Data Reviewed: I have independently reviewed following labs and imaging studies   CBC: Recent Labs  Lab 04/30/20 1521 05/01/20 1535 05/02/20 0230 05/03/20 0519  WBC 14.6* 14.7* 16.4* 17.9*  NEUTROABS 11.4*  --   --   --   HGB 11.3* 12.1 12.2 10.2*  HCT 35.6* 39.8 40.3 33.3*  MCV 79.5 80.9 81.1 81.8  PLT 362.0 392 404* 361   Basic Metabolic Panel: Recent Labs  Lab 04/30/20 1521 05/01/20 1535 05/02/20 0230 05/03/20 0519  NA 135 135 136 139  K 4.3 4.3 4.4 4.0  CL 103 102 105 110  CO2 29 24 24 22   GLUCOSE 116* 121* 144* 136*  BUN 8 6 6 10   CREATININE 0.66 0.67 0.65 0.62  CALCIUM 8.9 8.6* 8.4* 8.3*  MG 2.0  --   --   --    Liver Function Tests: Recent Labs  Lab 04/30/20 1521 05/01/20 1714 05/02/20 0230 05/03/20 0519  AST 273* 305* 289* 166*  ALT 175* 187* 186* 146*  ALKPHOS 64 62 64 46  BILITOT 0.4 0.8 1.1 0.8  PROT 7.9 7.9 8.3* 6.6  ALBUMIN 3.2* 2.9* 2.9* 2.5*   Coagulation Profile: Recent Labs  Lab 05/01/20 1907  INR 1.1   HbA1C: Recent Labs    05/02/20 0245  HGBA1C 5.8*   CBG: Recent Labs  Lab 05/01/20 1759  GLUCAP 84    No results found for this or any previous visit (from the past 240 hour(s)).   Radiology Studies: No results found.  06/29/20, MD, PhD [facility]  Between 7 am - 7 pm I am available, please contact me via Amion or Securechat  Between 7 pm  - 7 am I am not available, please contact night coverage MD/APP via Amion

## 2020-05-04 DIAGNOSIS — R748 Abnormal levels of other serum enzymes: Secondary | ICD-10-CM | POA: Diagnosis not present

## 2020-05-04 LAB — COMPREHENSIVE METABOLIC PANEL
ALT: 146 U/L — ABNORMAL HIGH (ref 0–44)
AST: 141 U/L — ABNORMAL HIGH (ref 15–41)
Albumin: 2.5 g/dL — ABNORMAL LOW (ref 3.5–5.0)
Alkaline Phosphatase: 38 U/L (ref 38–126)
Anion gap: 8 (ref 5–15)
BUN: 11 mg/dL (ref 6–20)
CO2: 21 mmol/L — ABNORMAL LOW (ref 22–32)
Calcium: 8.1 mg/dL — ABNORMAL LOW (ref 8.9–10.3)
Chloride: 109 mmol/L (ref 98–111)
Creatinine, Ser: 0.67 mg/dL (ref 0.44–1.00)
GFR, Estimated: >60 mL/min (ref >60)
Glucose, Bld: 113 mg/dL — ABNORMAL HIGH (ref 70–99)
Potassium: 3.6 mmol/L (ref 3.5–5.1)
Sodium: 138 mmol/L (ref 135–145)
Total Bilirubin: 0.6 mg/dL (ref 0.3–1.2)
Total Protein: 6.2 g/dL — ABNORMAL LOW (ref 6.5–8.1)

## 2020-05-04 LAB — CBC
HCT: 32.7 % — ABNORMAL LOW (ref 36.0–46.0)
Hemoglobin: 9.9 g/dL — ABNORMAL LOW (ref 12.0–15.0)
MCH: 24.8 pg — ABNORMAL LOW (ref 26.0–34.0)
MCHC: 30.3 g/dL (ref 30.0–36.0)
MCV: 81.8 fL (ref 80.0–100.0)
Platelets: 360 10*3/uL (ref 150–400)
RBC: 4 MIL/uL (ref 3.87–5.11)
RDW: 16.7 % — ABNORMAL HIGH (ref 11.5–15.5)
WBC: 14.5 10*3/uL — ABNORMAL HIGH (ref 4.0–10.5)
nRBC: 0 % (ref 0.0–0.2)

## 2020-05-04 LAB — CK: Total CK: 4260 U/L — ABNORMAL HIGH (ref 38–234)

## 2020-05-04 NOTE — Progress Notes (Signed)
PROGRESS NOTE  Rachael Patton WUJ:[id number] DOB: 04/21/2000 DOA: 05/01/2020 PCP: [name], PA   LOS: 2 days   Brief Narrative / Interim history: 20 year old female history of morbid obesity who came into the hospital for abnormal labs and muscle weakness.  She recently had COVID-19 infection and tested positive on 04/03/2020.  She did not require hospitalization, was treated conservatively and recovered completely.  She was also noted at that time to have elevated LFTs, and further labs showed hep A IgG antibody and everything else was negative.  Over the last couple of weeks she has developed bilateral leg weakness and upper extremity weakness, this has been going on for 2 weeks.  She also has developed a new rash on the inner side of her forearms and feels like her eyelids and around her eyes skin appears darker.  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  Continue fluids -Started on high-dose steroids 80 mg of prednisone daily -Needs follow-up with rheumatology, discussed with Dr. [name] with rheumatology, he will follow-up as an outpatient.  Unclear how to order myositis panel from the inpatient side -PT evaluated patient, she has difficulties navigating stairs, considering CIR however CIR states she is not a good candidate.  Awaiting physical therapy reevaluation as patient has not had a chance to do stair exercise, continue fluids, and hopefully home on Monday if CK continues to  improve.  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  This is not pruritic.  Probably benefit from outpatient dermatology evaluation, suspect related to #1  Elevated LFTs -Right upper quadrant ultrasound shows fatty liver disease, this was discussed with the patient.  Possibly related to #1 as well.  LFTs stable today.  She was advised to avoid alcohol, Tylenol -LFTs overall improving, stable  Iron deficiency anemia -Continue iron supplements  Recent COVID-19 infection -Positive test seen in care everywhere on 1/6.  No need for further precautions, no need for repeat testing since within 90-day window  Scheduled Meds: . enoxaparin (LOVENOX) injection  60 mg Subcutaneous Q24H  . ferrous gluconate  324 mg Oral q morning - 10a  . predniSONE  80 mg Oral Daily  . triamcinolone   Topical BID   Continuous Infusions: . sodium chloride 200 mL/hr at 05/04/20 0656   PRN Meds:.ondansetron **OR** ondansetron (ZOFRAN) IV  Diet Orders (From admission, onward)    Start     Ordered   05/01/20 2018  Diet regular Room service appropriate? Yes; Fluid consistency: Thin  Diet effective now       Question Answer Comment  Room service appropriate? Yes   Fluid consistency: Thin      05/01/20 2022          DVT prophylaxis:      Code Status: Full Code  Family Communication: No family at bedside  Status is: Inpatient  The patient will require care spanning > 2 midnights and should be moved to inpatient because: Inpatient level of care  appropriate due to severity of illness  Dispo: The patient is from: Home              Anticipated d/c is to: Home              Anticipated d/c date is: 1 day              Patient currently is not medically stable to d/c.   Difficult to place patient No   Level of care: Med-Surg  Consultants:  None  Procedures:  None   Microbiology  None   Antimicrobials: None      Objective: Vitals:   05/03/20 2007 05/03/20 2323 05/04/20 0458 05/04/20 0732  BP: 132/75 124/75 131/87 117/66  Pulse: 65 (!) 57 60 78  Resp: 18 18 18 20   Temp: 98.3 F (36.8 C) 98.1 F (36.7 C) 97.9 F (36.6 C) 98.2 F (36.8 C)  TempSrc: Oral Oral Oral Oral  SpO2: 99% 98% 97% 97%  Weight:      Height:        Intake/Output Summary (Last 24 hours) at 05/04/2020 1051 Last data filed at 05/03/2020 1533 Gross per 24 hour  Intake 5122.3 ml  Output --  Net 5122.3 ml   Filed Weights   05/01/20 1509 05/01/20 1753  Weight: 117.9 kg 119.7 kg    Examination:  Constitutional: NAD, sitting at the edge of the bed Eyes: No icterus ENMT: mmm Neck: normal, supple Respiratory: CTA bilaterally, no wheezing, no crackles Cardiovascular: rrr. No mrg Abdomen: nd, bs+ Musculoskeletal: no clubbing / cyanosis.  Skin: macular rash inner UE, non blancing, no new rashes today Neurologic: Nonfocal   Data Reviewed: I have independently reviewed following labs and imaging studies   CBC: Recent Labs  Lab 04/30/20 1521 05/01/20 1535 05/02/20 0230 05/03/20 0519 05/04/20 0448  WBC 14.6* 14.7* 16.4* 17.9* 14.5*  NEUTROABS 11.4*  --   --   --   --   HGB 11.3* 12.1 12.2 10.2* 9.9*  HCT 35.6* 39.8 40.3 33.3* 32.7*  MCV 79.5 80.9 81.1 81.8 81.8  PLT 362.0 392 404* 361 360   Basic Metabolic Panel: Recent Labs  Lab 04/30/20 1521 05/01/20 1535 05/02/20 0230 05/03/20 0519 05/04/20 0448  NA 135 135 136 139 138  K 4.3 4.3 4.4 4.0 3.6  CL 103 102 105 110 109  CO2 29 24 24 22  21*  GLUCOSE 116* 121* 144* 136* 113*  BUN 8 6 6 10 11   CREATININE 0.66 0.67 0.65 0.62 0.67  CALCIUM 8.9 8.6* 8.4* 8.3* 8.1*  MG 2.0  --   --   --   --    Liver Function Tests: Recent Labs  Lab 04/30/20 1521 05/01/20 1714 05/02/20 0230 05/03/20 0519 05/04/20 0448  AST 273* 305* 289* 166* 141*  ALT 175* 187* 186* 146* 146*  ALKPHOS 64 62 64 46 38  BILITOT 0.4 0.8 1.1 0.8 0.6  PROT 7.9 7.9 8.3* 6.6 6.2*   ALBUMIN 3.2* 2.9* 2.9* 2.5* 2.5*   Coagulation Profile: Recent Labs  Lab 05/01/20 1907  INR 1.1   HbA1C: Recent Labs    05/02/20 0245  HGBA1C 5.8*   CBG: Recent Labs  Lab 05/01/20 1759  GLUCAP 84    No results found for this or any previous visit (from the past 240 hour(s)).   Radiology Studies: No results found.  06/29/20, MD, PhD [facility]  Between 7 am - 7 pm I am available, please contact me via Amion or Securechat  Between 7 pm - 7 am I am not available, please contact night coverage MD/APP via Amion

## 2020-05-05 DIAGNOSIS — M6282 Rhabdomyolysis: Principal | ICD-10-CM

## 2020-05-05 DIAGNOSIS — R748 Abnormal levels of other serum enzymes: Secondary | ICD-10-CM | POA: Diagnosis not present

## 2020-05-05 DIAGNOSIS — M3320 Polymyositis, organ involvement unspecified: Secondary | ICD-10-CM

## 2020-05-05 DIAGNOSIS — M6281 Muscle weakness (generalized): Secondary | ICD-10-CM | POA: Diagnosis not present

## 2020-05-05 DIAGNOSIS — R7989 Other specified abnormal findings of blood chemistry: Secondary | ICD-10-CM

## 2020-05-05 DIAGNOSIS — D509 Iron deficiency anemia, unspecified: Secondary | ICD-10-CM | POA: Diagnosis not present

## 2020-05-05 LAB — BASIC METABOLIC PANEL
Anion gap: 7 (ref 5–15)
BUN: 8 mg/dL (ref 6–20)
CO2: 21 mmol/L — ABNORMAL LOW (ref 22–32)
Calcium: 8.2 mg/dL — ABNORMAL LOW (ref 8.9–10.3)
Chloride: 111 mmol/L (ref 98–111)
Creatinine, Ser: 0.6 mg/dL (ref 0.44–1.00)
GFR, Estimated: >60 mL/min (ref >60)
Glucose, Bld: 101 mg/dL — ABNORMAL HIGH (ref 70–99)
Potassium: 3.1 mmol/L — ABNORMAL LOW (ref 3.5–5.1)
Sodium: 139 mmol/L (ref 135–145)

## 2020-05-05 LAB — CBC
HCT: 30.6 % — ABNORMAL LOW (ref 36.0–46.0)
Hemoglobin: 9.9 g/dL — ABNORMAL LOW (ref 12.0–15.0)
MCH: 25.9 pg — ABNORMAL LOW (ref 26.0–34.0)
MCHC: 32.4 g/dL (ref 30.0–36.0)
MCV: 80.1 fL (ref 80.0–100.0)
Platelets: 360 10*3/uL (ref 150–400)
RBC: 3.82 MIL/uL — ABNORMAL LOW (ref 3.87–5.11)
RDW: 17 % — ABNORMAL HIGH (ref 11.5–15.5)
WBC: 14.9 10*3/uL — ABNORMAL HIGH (ref 4.0–10.5)
nRBC: 0 % (ref 0.0–0.2)

## 2020-05-05 LAB — CK: Total CK: 4047 U/L — ABNORMAL HIGH (ref 38–234)

## 2020-05-05 MED ORDER — PREDNISONE 20 MG PO TABS: 80.0000 mg | ORAL_TABLET | Freq: Every day | ORAL | 0 refills | Status: DC

## 2020-05-05 MED ORDER — POTASSIUM CHLORIDE CRYS ER 20 MEQ PO TBCR
40.0000 meq | EXTENDED_RELEASE_TABLET | ORAL | Status: DC
  Administered 2020-05-05: 40 meq via ORAL

## 2020-05-05 NOTE — Discharge Summary (Signed)
Physician Discharge Summary  Rachael Patton XHF:[id number] DOB: [birth year] DOA: 05/01/2020  PCP: Rachael Motto, PA  Admit date: 05/01/2020 Discharge date: 05/05/2020  Admitted From: home Disposition:  home  Recommendations for Outpatient Follow-up:  1. Follow up with Dr. Dimple Patton with rheumatology as soon as an appointment is available 2. Please obtain BMP/CBC/CK in one week 3. Follow up with PCP in 1 week  Home Health: none Equipment/Devices: none  Discharge Condition: stable CODE STATUS: Full code Diet recommendation: regular  HPI:  20 year old female history of morbid obesity who came into the hospital for abnormal labs and muscle weakness.  She recently had COVID-19 infection and tested positive on 04/03/2020.  She did not require hospitalization, was treated conservatively and recovered completely.  She was also noted at that time to have elevated LFTs, and further labs showed hep A IgG antibody and everything else was negative.  Over the last couple of weeks she has developed bilateral leg weakness and upper extremity weakness, this has been going on for 2 weeks.  She also has developed a new rash on the inner side of her forearms and feels like her eyelids and around her eyes skin appears darker.  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  She was started on high-dose steroids with prednisone 80 mg as well as IV fluids.  Clinically she improved significantly, her pain has resolved, her strength is returning and she is able to ambulate without difficulties.  She is able to climb up a flight of stairs much better than prior to admission.  Case was discussed  with Dr. Dimple Patton with rheumatology and he will follow up with her as an outpatient.  CK has improved, she is tolerating a regular diet, kidney function has remained normal and she will be discharged home in stable condition.  She was advised to continue to drink plenty of fluids and avoid extraneous physical activity.  She will have a follow-up with her PCP and recommend repeat blood work at that time.  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  This is not pruritic.  Probably benefit from outpatient dermatology evaluation, suspect related to #1.  Rash improving Elevated LFTs -Right upper quadrant ultrasound shows fatty liver disease, this was discussed with the patient.  Possibly related to #1 as well.  LFTs stable today and improving.  She would benefit from weight loss however being on steroids would make that difficult now Iron deficiency anemia -Continue iron supplements Recent COVID-19 infection -Positive test seen in care everywhere on 1/6.  No need for further precautions, no need for repeat testing since within 90-day window ?  Hypothyroidism-she appears to be on Synthroid however TSH here is normal.  Patient said she has not started taking her Synthroid.  Defer to PCP Morbid obesity-BMI 43, she would benefit from weight loss  Sepsis ruled out   Discharge Instructions   Allergies as of 05/05/2020   No Known Allergies     Medication List    STOP taking these medications   levothyroxine 50 MCG tablet Commonly known as:  SYNTHROID     TAKE these medications   ferrous gluconate 324 MG tablet Commonly known as: FERGON Take 324 mg by mouth every morning.   predniSONE 20 MG tablet Commonly known as: DELTASONE Take 4 tablets (80 mg total) by mouth daily with breakfast.   triamcinolone 0.1 % Commonly known as: KENALOG Apply  to affected area 1-2 times daily       Follow-up Information    Fuller Plan, MD. Schedule an appointment as soon as possible for a visit in 1 week(s).   Specialty: Rheumatology Contact information: [address] [place] [address] [phone]        Rachael Patton, [place]. Schedule an appointment as soon as possible for a visit in 1 week(s).   Specialty: Physician Assistant Contact information: [address] [place] [address] [phone]               Consultations:  None   Procedures/Studies:  DG Chest 2 View  Result Date: 05/01/2020 CLINICAL DATA:  Elevated LFTs EXAM: CHEST - 2 VIEW COMPARISON:  None. FINDINGS: The heart size and mediastinal contours are within normal limits. Both lungs are clear. The visualized skeletal structures are unremarkable. IMPRESSION: No active cardiopulmonary disease. Electronically Signed   By: Jonna Clark M.D.   On: 05/01/2020 20:54   US Abdomen Limited RUQ (LIVER/GB)  Result Date: 05/01/2020 CLINICAL DATA:  Increased LFTs EXAM: ULTRASOUND ABDOMEN LIMITED RIGHT UPPER QUADRANT COMPARISON:  None. FINDINGS: Gallbladder: No gallstones or wall thickening visualized. No sonographic Murphy sign noted by sonographer. Common bile duct: Diameter: 2.5 mm Liver: No focal lesion identified. Mildly increased echogenicity seen throughout the liver parenchyma. Portal vein is patent on color Doppler imaging with normal direction of blood flow towards the liver. Other: None. IMPRESSION: Normal appearing gallbladder. Mild hepatic steatosis Electronically Signed   By: Jonna Clark M.D.   On: 05/01/2020 21:08      Subjective: - no chest pain, shortness of breath, no abdominal pain, nausea or vomiting.   Discharge Exam: BP 118/76 (BP Location: Right Arm)   Pulse 60   Temp 98.2 F (36.8 C) (Oral)   Resp 20   Ht 5\' 5"  (1.651 m)   Wt 119.7 kg   SpO2 100%   BMI 43.93 kg/m   General: Pt is alert, awake, not in acute  distress Neuro: non focal, ambulatory  The results of significant diagnostics from this hospitalization (including imaging, microbiology, ancillary and laboratory) are listed below for reference.     Microbiology: No results found for this or any previous visit (from the past 240 hour(s)).   Labs: Basic Metabolic Panel: Recent Labs  Lab 04/30/20 1521 05/01/20 1535 05/02/20 0230 05/03/20 0519 05/04/20 0448 05/05/20 0847  NA 135 135 136 139 138 139  K 4.3 4.3 4.4 4.0 3.6 3.1*  CL 103 102 105 110 109 111  CO2 29 24 24 22  21* 21*  GLUCOSE 116* 121* 144* 136* 113* 101*  BUN 8 6 6 10 11 8   CREATININE 0.66 0.67 0.65 0.62 0.67 0.60  CALCIUM 8.9 8.6* 8.4* 8.3* 8.1* 8.2*  MG 2.0  --   --   --   --   --    Liver Function Tests: Recent Labs  Lab 04/30/20 1521 05/01/20 1714 05/02/20 0230 05/03/20 0519 05/04/20 0448  AST 273* 305* 289* 166* 141*  ALT 175* 187* 186* 146* 146*  ALKPHOS 64 62 64 46 38  BILITOT 0.4 0.8 1.1 0.8 0.6  PROT 7.9 7.9 8.3* 6.6 6.2*  ALBUMIN 3.2* 2.9* 2.9* 2.5* 2.5*   CBC: Recent Labs  Lab 04/30/20 1521 05/01/20 1535 05/02/20 0230 05/03/20 0519 05/04/20 0448 05/05/20 0847  WBC 14.6* 14.7* 16.4* 17.9* 14.5* 14.9*  NEUTROABS 11.4*  --   --   --   --   --   HGB 11.3* 12.1 12.2 10.2* 9.9* 9.9*  HCT 35.6* 39.8 40.3 33.3* 32.7* 30.6*  MCV 79.5 80.9 81.1 81.8 81.8 80.1  PLT 362.0 392 404* 361 360 360   CBG: Recent Labs  Lab 05/01/20 1759  GLUCAP 84   Hgb A1c No results for input(s): HGBA1C in the last 72 hours. Lipid Profile No results for input(s): CHOL, HDL, LDLCALC, TRIG, CHOLHDL, LDLDIRECT in the last 72 hours. Thyroid function studies No results for input(s): TSH, T4TOTAL, T3FREE, THYROIDAB in the last 72 hours.  Invalid input(s): FREET3 Urinalysis    Component Value Date/Time   COLORURINE YELLOW 05/01/2020 1524   APPEARANCEUR CLEAR 05/01/2020 1524   LABSPEC 1.018 05/01/2020 1524   PHURINE 6.0 05/01/2020 1524   GLUCOSEU NEGATIVE  05/01/2020 1524   HGBUR LARGE (A) 05/01/2020 1524   BILIRUBINUR NEGATIVE 05/01/2020 1524   KETONESUR 5 (A) 05/01/2020 1524   PROTEINUR 30 (A) 05/01/2020 1524   NITRITE NEGATIVE 05/01/2020 1524   LEUKOCYTESUR NEGATIVE 05/01/2020 1524    FURTHER DISCHARGE INSTRUCTIONS:   Get Medicines reviewed and adjusted: Please take all your medications with you for your next visit with your Primary MD   Laboratory/radiological data: Please request your Primary MD to go over all hospital tests and procedure/radiological results at the follow up, please ask your Primary MD to get all Hospital records sent to his/her office.   In some cases, they will be blood work, cultures and biopsy results pending at the time of your discharge. Please request that your primary care M.D. goes through all the records of your hospital data and follows up on these results.   Also Note the following: If you experience worsening of your admission symptoms, develop shortness of breath, life threatening emergency, suicidal or homicidal thoughts you must seek medical attention immediately by calling 911 or calling your MD immediately  if symptoms less severe.   You must read complete instructions/literature along with all the possible adverse reactions/side effects for all the Medicines you take and that have been prescribed to you. Take any new Medicines after you have completely understood and accpet all the possible adverse reactions/side effects.    Do not drive when taking Pain medications or sleeping medications (Benzodaizepines)   Do not take more than prescribed Pain, Sleep and Anxiety Medications. It is not advisable to combine anxiety,sleep and pain medications without talking with your primary care practitioner   Special Instructions: If you have smoked or chewed Tobacco  in the last 2 yrs please stop smoking, stop any regular Alcohol  and or any Recreational drug use.   Wear Seat belts while driving.   Please  note: You were cared for by a hospitalist during your hospital stay. Once you are discharged, your primary care physician will handle any further medical issues. Please note that NO REFILLS for any discharge medications will be authorized once you are discharged, as it is imperative that you return to your primary care physician (or establish a relationship with a primary care physician if you do not have one) for your post hospital discharge needs so that they can reassess your need for medications and monitor your lab  values.  Time coordinating discharge: 35 minutes  SIGNED:  Pamella Pert, MD, PhD 05/05/2020, 11:02 AM

## 2020-05-05 NOTE — Progress Notes (Signed)
Physical Therapy Treatment Patient Details Name: Rachael Patton MRN: [id number] DOB: 2001/02/15 Today's Date: 05/05/2020    History of Present Illness 20 yo female was brought to ED for progressive weakness and SOB with early Jan Covid dx.  Has elevated CK, rhabdomyolysis, and LE weakness at hips and quads.  Has elevated LFT's, anemia, and difficulty with stairs and longer distances.  PMHx:  fatty liver disease, hepatic steatosis, thyroid disease, R ankle fracture with ORIF    PT Comments    Pt progressing towards physical therapy goals. Was able to perform transfers and ambulation with gross supervision for safety, however close guard required for stair training. Pt asking about utilizing Theraband for her HEP. At this time, tolerance for functional activity is decreased, and feel she would be better served with body weight exercises. I did issue her a lighter and more moderate Theraband for when she improves with strength and endurance. Will continue to follow until discharge, however pt anticipates d/c home today.    Follow Up Recommendations  Outpatient PT;Supervision for mobility/OOB     Equipment Recommendations  None recommended by PT    Recommendations for Other Services       Precautions / Restrictions Precautions Precautions: None;Fall (Fall on stairs) Precaution Comments: fall risk mainly on stairs Restrictions Weight Bearing Restrictions: No    Mobility  Bed Mobility Overal bed mobility: Needs Assistance Bed Mobility: Supine to Sit;Sit to Supine     Supine to sit: Supervision     General bed mobility comments: Pt able to transition to EOB without assistance.  Transfers Overall transfer level: Needs assistance Equipment used: None Transfers: Sit to/from Stand Sit to Stand: Supervision         General transfer comment: No assist required. Pt powered up to full stand without difficulty.  Ambulation/Gait Ambulation/Gait assistance: Supervision Gait  Distance (Feet): 200 Feet Assistive device: None Gait Pattern/deviations: Step-through pattern;Wide base of support;Drifts right/left;Decreased stride length Gait velocity: Decreased Gait velocity interpretation: 1.31 - 2.62 ft/sec, indicative of limited community ambulator General Gait Details: Pt ambulating without overt LOB noted. She initially required close supervision progressing to light supervision by end of session.   Stairs Stairs: Yes Stairs assistance: Min guard Stair Management: One rail Left Number of Stairs: 10 General stair comments: VC's for sequencing and general safety. Step-to pattern. Pt moderately fatigued by end of stair training.   Wheelchair Mobility    Modified Rankin (Stroke Patients Only)       Balance Overall balance assessment: Needs assistance Sitting-balance support: Feet supported Sitting balance-Leahy Scale: Good     Standing balance support: Bilateral upper extremity supported;During functional activity Standing balance-Leahy Scale: Fair                              Cognition Arousal/Alertness: Awake/alert Behavior During Therapy: WFL for tasks assessed/performed Overall Cognitive Status: Within Functional Limits for tasks assessed                                        Exercises Other Exercises Other Exercises: HEP via Medbridge - Access code: [email or web address]    General Comments        Pertinent Vitals/Pain Pain Assessment: No/denies pain    Home Living                      Prior Function  PT Goals (current goals can now be found in the care plan section) Acute Rehab PT Goals Patient Stated Goal: to feel stronger PT Goal Formulation: With patient/family Time For Goal Achievement: 05/16/20 Potential to Achieve Goals: Good Progress towards PT goals: Progressing toward goals    Frequency    Min 3X/week      PT Plan Discharge plan needs to be updated    Co-evaluation               AM-PAC PT "6 Clicks" Mobility   Outcome Measure  Help needed turning from your back to your side while in a flat bed without using bedrails?: None Help needed moving from lying on your back to sitting on the side of a flat bed without using bedrails?: A Little Help needed moving to and from a bed to a chair (including a wheelchair)?: A Little Help needed standing up from a chair using your arms (e.g., wheelchair or bedside chair)?: A Little Help needed to walk in hospital room?: A Little Help needed climbing 3-5 steps with a railing? : A Lot 6 Click Score: 18    End of Session Equipment Utilized During Treatment: Gait belt Activity Tolerance: Patient limited by fatigue;Treatment limited secondary to medical complications (Comment) Patient left: in bed;with call bell/phone within reach;with family/visitor present Nurse Communication: Mobility status PT Visit Diagnosis: Unsteadiness on feet (R26.81);Muscle weakness (generalized) (M62.81);Difficulty in walking, not elsewhere classified (R26.2)     Time: 6381-7711 PT Time Calculation (min) (ACUTE ONLY): 41 min  Charges:  $Gait Training: 23-37 mins $Therapeutic Exercise: 8-22 mins                     [name], PT, DPT Acute Rehabilitation Services Pager: [phone] Office: [phone]    Marylynn Pearson 05/05/2020, 11:39 AM

## 2020-05-05 NOTE — Progress Notes (Signed)
Patient is discharged from room 3C07 at this time. Alert and in stable condition. IV site d/c'd and instructions read to patient and mother with understanding verbalized and all questions answered. Left unit via wheelchair with all belongings at side.

## 2020-05-07 NOTE — Progress Notes (Signed)
Office Visit Note  Patient: Rachael Patton             Date of Birth: [birth year]           MRN: [id number]             PCP: Inda Coke, PA Referring: Caren Griffins, MD Visit Date: 05/08/2020 Occupation: Childcare provider  Subjective:  New Patient (Initial Visit) (Patient was recently hospitalized and is complaining of muscle pain and weakness and fluid retention in bilateral feet. Patient is currently on 80 mg of Prednisone daily and notices much improvement since being on the steroid. Patient was previously evaluated by a rheumatologist, one visit only, about 5 years ago in [place], [place]. )   History of Present Illness: Rachael Patton is a 20 y.o. female with history of hypothyroidism, elevated prolactin here for follow up of hospitalization last week with proximal muscle weakness found to have highly elevated CK and myoglobinuria. This was treated with IV fluids and started steroid treatment with improvement in CK from 11,796 to 4,047. She was discharged on prednisone $RemoveBefor'80mg'liqdKSCSRLAr$  per day dose. Prior to this she was ill with COVID a month ago. She was having some type of GI illness in December not clear if this was related to COVID infection or separate process. Workup with labs on 1/6 and 1/7 showing transaminitis also positive Ab tests for COVID19 and Hepatitis A. Since the hospital visit she feels her symptoms are partially improved. She notices continued leg swelling and has some dyspnea with lying supine and on exertion. Skin rash on the face is slightly less warm and severe than before. She thinks strength is slightly better but not at her baseline at all.  Labs reviewed 05/05/2020 CK 4,047 K 3.1 Ca 8.2 WBC 14.9 Hgb 9.9  05/01/2020 HIV neg CK 11,796 Large myoglobinuria TSH 3.741 ANA neg RF neg ESR 60  Imaging reviewed 05/01/2020 CXR No acute cardiopulmonary process  RUQ [place] Mild hepatic steatosis, no gallbladder disease  Activities of Daily Living:  Patient reports  morning stiffness for 0 minutes.   Patient Reports nocturnal pain.  Difficulty dressing/grooming: Reports Difficulty climbing stairs: Reports Difficulty getting out of chair: Reports Difficulty using hands for taps, buttons, cutlery, and/or writing: Denies  Review of Systems  Constitutional: Positive for fatigue.  HENT: Positive for mouth dryness. Negative for mouth sores and nose dryness.   Eyes: Negative for pain, itching, visual disturbance and dryness.  Respiratory: Positive for cough and shortness of breath. Negative for hemoptysis and difficulty breathing.   Cardiovascular: Positive for chest pain, palpitations and swelling in legs/feet.  Gastrointestinal: Positive for diarrhea. Negative for abdominal pain, blood in stool and constipation.  Endocrine: Positive for increased urination.  Genitourinary: Negative for painful urination.  Musculoskeletal: Positive for myalgias, muscle weakness, muscle tenderness and myalgias. Negative for arthralgias, joint pain, joint swelling and morning stiffness.  Skin: Positive for rash. Negative for color change and redness.  Allergic/Immunologic: Positive for susceptible to infections.  Neurological: Positive for weakness. Negative for dizziness, numbness, headaches and memory loss.  Hematological: Negative for swollen glands.  Psychiatric/Behavioral: Positive for sleep disturbance. Negative for confusion.    PMFS History:  Patient Active Problem List   Diagnosis Date Noted  . Morbid obesity with BMI of 40.0-44.9, adult ([place]) 05/08/2020  . Myositis 05/08/2020  . High risk medication use 05/08/2020  . Volume overload 05/08/2020  . Oropharyngeal candidiasis 05/08/2020  . 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 Morbid obesity ([place]) 04/30/2020  . Prolactin increased 04/30/2020  .  Hypothyroidism 04/30/2020  . Prediabetes 04/30/2020  . Iron deficiency anemia 04/30/2020  . Elevated LFTs 04/30/2020  . 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 Asthma    As a child  . Prediabetes   . Thyroid disease    found at age 13    Family History  Problem Relation Age of Onset  . Diabetes Mother   . Diabetes Maternal Grandmother   . Diabetes Paternal Grandmother   . Stroke Paternal Grandmother   . Stroke Paternal Grandfather   . Diabetes Paternal Grandfather   . Multiple sclerosis Cousin   . Lupus Cousin    Past Surgical History:  Procedure Laterality Date  . FOOT SURGERY Left 06/2019   Social History   Social History Narrative   Graduated from [employer] in sociology/toxicology   Starting day care job soon   Lives with boyfriend   No children   Immunization History  Administered Date(s) Administered  . PFIZER(Purple Top)SARS-COV-2 Vaccination 04/17/2020     Objective: Vital Signs: BP (!) 141/88 (BP Location: Right Arm, Patient Position: Sitting, Cuff Size: Normal)   Pulse 79   Ht 5\' 5"  (1.651 m)   Wt 265 lb (120.2 kg)   LMP 04/04/2020   BMI 44.10 kg/m    Physical Exam Constitutional:      Appearance: She is obese.  HENT:     Right Ear: External ear normal.     Left Ear: External ear normal.     Mouth/Throat:     Mouth: Mucous membranes are moist.     Pharynx: Oropharynx is clear.     Comments: Thrush covering surface of tongue Eyes:     Conjunctiva/sclera: Conjunctivae normal.  Cardiovascular:     Rate and Rhythm: Normal rate and regular rhythm.  Pulmonary:     Effort: Pulmonary effort is normal.     Comments: Bibasilar inspiratory crackles Skin:    General: Skin is warm and dry.     Comments: Flat, hyperpigmented rash over face on forehead, cheeks, also around eyes Patch of numerous small round hyperpigmented spots on medial aspect of arm at both elbows Mild hyperpigmentation over MCP and PIP joints Pitting edema in both shins without erythema  Neurological:     Mental Status: She is alert.     Deep Tendon Reflexes: Reflexes normal.     Comments: Shoulders strength  4/5, elbows flexion/extension 5/5, grip strength 5/5, hip flexion 3/5, knee flexion/extension 5/5, ankle dorsiflexion/plantarflexion 5/5  Psychiatric:        Mood and Affect: Mood normal.     Musculoskeletal Exam:  Neck full ROM no tenderness Shoulders full ROM no tenderness or swelling Elbows full ROM no tenderness or swelling Wrists full ROM no tenderness or swelling Fingers full ROM no tenderness or swelling Knees full ROM no tenderness or swelling Ankles full ROM no tenderness  Investigation: No additional findings.  Imaging: DG Chest 2 View  Result Date: 05/01/2020 CLINICAL DATA:  Elevated LFTs EXAM: CHEST - 2 VIEW COMPARISON:  None. FINDINGS: The heart size and mediastinal contours are within normal limits. Both lungs are clear. The visualized skeletal structures are unremarkable. IMPRESSION: No active cardiopulmonary disease. Electronically Signed   By: 06/29/2020 M.D.   On: 05/01/2020 20:54   06/29/2020 Abdomen Limited RUQ (LIVER/GB)  Result Date: 05/01/2020 CLINICAL DATA:  Increased LFTs EXAM: ULTRASOUND ABDOMEN LIMITED RIGHT UPPER QUADRANT COMPARISON:  None. FINDINGS: Gallbladder: No gallstones or wall thickening visualized. No  sonographic Percell Miller sign noted by sonographer. Common bile duct: Diameter: 2.5 mm Liver: No focal lesion identified. Mildly increased echogenicity seen throughout the liver parenchyma. Portal vein is patent on color Doppler imaging with normal direction of blood flow towards the liver. Other: None. IMPRESSION: Normal appearing gallbladder. Mild hepatic steatosis Electronically Signed   By: Prudencio Pair M.D.   On: 05/01/2020 21:08    Recent Labs: Lab Results  Component Value Date   WBC 14.9 (H) 05/05/2020   HGB 9.9 (L) 05/05/2020   PLT 360 05/05/2020   NA 139 05/08/2020   K 4.2 05/08/2020   CL 108 05/08/2020   CO2 25 05/08/2020   GLUCOSE 123 (H) 05/08/2020   BUN 10 05/08/2020   CREATININE 0.59 05/08/2020   BILITOT 0.4 05/08/2020   ALKPHOS 38 05/04/2020    AST 91 (H) 05/08/2020   ALT 123 (H) 05/08/2020   PROT 6.7 05/08/2020   ALBUMIN 2.5 (L) 05/04/2020   CALCIUM 8.6 05/08/2020   GFRAA 152 05/08/2020    Speciality Comments: No specialty comments available.  Procedures:  No procedures performed Allergies: Patient has no known allergies.   Assessment / Plan:     Visit Diagnoses: Myositis of thigh, unspecified laterality, unspecified myositis type - Plan: Myositis Assessr Plus Jo-1 Autoabs, CK, COMPLETE METABOLIC PANEL WITH GFR, Hepatitis panel, acute, Glucose 6 phosphate dehydrogenase, Ambulatory referral to Physical Therapy, Ambulatory referral to General Surgery  New onset myositis suspicious for either provoked by recent COVID infection but the delay is suggestive for underlying inflammatory process. Facial rash not typical heliotrope but could suggest DM. Will obtain myositis Ab panel. Checking CK for response to treatment so far. CMP for multiple electrolyte derangements at hospital encounter. G6PD anticipate azathioprine treatment option if needed. Referral to general surgery for muscle biopsy for definitive diagnosis. Currently on $RemoveBefo'80mg'bYbvUKzxSew$  prednisone, will see CK but would dose reduce to $RemoveB'60mg'DCViihTi$ /day in split dose if responding.  Non-traumatic rhabdomyolysis  Seems secondary to an ongoing inflammatory muscle problems. CK levels down below 5000 do not suspect risk for renal injury any longer unless severely worsens.  Hypothyroidism, unspecified type  Hypothyroidism without specific autoimmune condition, supplementation seems it was adequate from most recent thyroid function tests.  High risk medication use - Plan: Hepatitis panel, acute, Glucose 6 phosphate dehydrogenase, sulfamethoxazole-trimethoprim (BACTRIM) 400-80 MG tablet, fluconazole (DIFLUCAN) 100 MG tablet  Discussed risks of high dose prednisone with anticipating months long tapering plan, including fluid retention, hyperglycemia, osteopenia, infections, hypertension, mood  disturbance, and cataracts. Start low dose bactrim ppx while dose $RemoveBe'40mg'vyiNEsWym$  or greater per day. Checking hepatitis panel and G6PD in anticipation for future steroid sparing agent.  Hypervolemia, unspecified hypervolemia type  Orthopnea and leg edema still seems overloaded, probably from combination of IV fluids at hospital for renal protection and with continued steroids. Checking CMP today.  Oropharyngeal candidiasis - Plan: fluconazole (DIFLUCAN) 100 MG tablet  Oral thrush 2/2 steroid treatment, Rx for fluconazole to treat at this time if recurs would try maintenance nystatin rinse.  Orders: Orders Placed This Encounter  Procedures  . Myositis Assessr Plus Jo-1 Autoabs  . CK  . COMPLETE METABOLIC PANEL WITH GFR  . Hepatitis panel, acute  . Glucose 6 phosphate dehydrogenase  . Ambulatory referral to Physical Therapy  . Ambulatory referral to General Surgery   Meds ordered this encounter  Medications  . sulfamethoxazole-trimethoprim (BACTRIM) 400-80 MG tablet    Sig: Take 1 tablet by mouth 3 (three) times a week. For prophylaxis on high dose prednisone.  Dispense:  30 tablet    Refill:  0  . fluconazole (DIFLUCAN) 100 MG tablet    Sig: Take 1 tablet (100 mg total) by mouth daily for 7 days.    Dispense:  7 tablet    Refill:  0    Follow-Up Instructions: Return in about 2 weeks (around 05/22/2020).   Collier Salina, MD  Note - This record has been created using [employer].  Chart creation errors have been sought, but may not always  have been located. Such creation errors do not reflect on  the standard of medical care.

## 2020-05-08 ENCOUNTER — Encounter: Payer: Self-pay | Admitting: Internal Medicine

## 2020-05-08 ENCOUNTER — Other Ambulatory Visit: Payer: Self-pay

## 2020-05-08 ENCOUNTER — Ambulatory Visit (INDEPENDENT_AMBULATORY_CARE_PROVIDER_SITE_OTHER): Payer: BLUE CROSS/BLUE SHIELD | Admitting: Internal Medicine

## 2020-05-08 VITALS — BP 141/88 | HR 79 | Ht 65.0 in | Wt 265.0 lb

## 2020-05-08 DIAGNOSIS — M609 Myositis, unspecified: Secondary | ICD-10-CM | POA: Insufficient documentation

## 2020-05-08 DIAGNOSIS — Z79899 Other long term (current) drug therapy: Secondary | ICD-10-CM | POA: Diagnosis not present

## 2020-05-08 DIAGNOSIS — M60859 Other myositis, unspecified thigh: Secondary | ICD-10-CM | POA: Diagnosis not present

## 2020-05-08 DIAGNOSIS — E039 Hypothyroidism, unspecified: Secondary | ICD-10-CM

## 2020-05-08 DIAGNOSIS — M6282 Rhabdomyolysis: Secondary | ICD-10-CM | POA: Diagnosis not present

## 2020-05-08 DIAGNOSIS — E8770 Fluid overload, unspecified: Secondary | ICD-10-CM

## 2020-05-08 DIAGNOSIS — B370 Candidal stomatitis: Secondary | ICD-10-CM | POA: Insufficient documentation

## 2020-05-08 LAB — OB RESULTS CONSOLE HEPATITIS B SURFACE ANTIGEN: Hepatitis B Surface Ag: NEGATIVE

## 2020-05-08 MED ORDER — SULFAMETHOXAZOLE-TRIMETHOPRIM 400-80 MG PO TABS: 1.0000 | ORAL_TABLET | ORAL | 0 refills | Status: DC

## 2020-05-08 MED ORDER — FLUCONAZOLE 100 MG PO TABS: 100.0000 mg | ORAL_TABLET | Freq: Every day | ORAL | 0 refills | Status: AC

## 2020-05-08 NOTE — Patient Instructions (Signed)
We are checking CK level to monitor amount of continued muscle inflammation. I will contact you with recommendation for prednisone dose management after seeing this.  I am checking several other tests for antibodies or for monitoring in anticipation of starting alternative medication once we can reduce the prednisone.  To reduce risk of serious infection while on high dose prednisone, start taking Bactrim (trimethoprim-sulfamethoxazole) once every other day for prophylaxis.  For current oral yeast infection related to the steroids recommend taking fluconazole 1 tablet daily for 7 days to treat this.  I am referring you for surgery visit about getting a muscle biopsy to diagnose this cause of muscle inflammation more accurately.  I am referring you for physical therapy for exercise recommendations to prevent loss of strength as much as possible during this process.

## 2020-05-10 MED ORDER — PREDNISONE 20 MG PO TABS: ORAL_TABLET | ORAL | 0 refills | Status: DC

## 2020-05-10 NOTE — Addendum Note (Signed)
Addended by: Fuller Plan on: 05/10/2020 11:47 AM   Modules accepted: Orders

## 2020-05-11 NOTE — Progress Notes (Signed)
Spoke with Rachael Patton CK has continued to improve since hospital discharge although slowly. I recommended decreasing prednisone to 60 mg total daily dose but split AM/PM and we will f/u to repeat in 2 wks. Hepatitis panel negative. G6PD function is good.

## 2020-05-12 ENCOUNTER — Telehealth: Payer: Self-pay | Admitting: Radiology

## 2020-05-12 ENCOUNTER — Ambulatory Visit: Payer: Self-pay | Admitting: Surgery

## 2020-05-12 NOTE — Telephone Encounter (Signed)
Spoke with patient, advised okay to continue taking her medications per Dr. [name] given the surgery is very superficial so should not be a problem for recovery or needing medication changes.

## 2020-05-12 NOTE — H&P (Signed)
History of Present Illness ([place] L. Freida Busman MD; 05/12/2020 12:02 PM) Patient words: HPI: Ms. Ignasiak is a 20 yo female who was referred for a muscle biopsy for workup of myositis. She had COVID one month ago in early January, and later developed muscle weakness. She had progression of her symptoms and presented to the ED, where she was found to have significant elevation of her CK to 11,000. She was admitted and treated with IV fluid hydration and high dose steroids. Renal function improved with hydration. Weakness has improved slightly with steroids. She has seen Dr. [name] with rheumatology and was referred for muscle biopsy for suspected myositis. She endorses weakness in both her proximal arms and legs, but feels the right thigh is the weakest. She denies pain.  PMH: asthma, hypothyroid  PSH: left foot surgery  FHx: diabetes (mother)  Social: Occasional EtOH, nonsmoker.  The patient is a 20 year old female.   Allergies Marliss Coots, CNA; 05/12/2020 11:30 AM) No Known Allergies  [05/12/2020]: No Known Drug Allergies  [05/12/2020]: Allergies Reconciled   Medication History Marliss Coots, CNA; 05/12/2020 11:30 AM) No Current Medications Medications Reconciled    Review of Systems (Zoeya Gramajo L. Freida Busman MD; 05/12/2020 12:03 PM) General Not Present- Chills and Fever. Respiratory Not Present- Dyspnea and Wheezing. Gastrointestinal Not Present- Abdominal Swelling. Musculoskeletal Present- Leg Weakness and Muscle Weakness. Neurological Not Present- Dizziness and Fainting.  Vitals Daiva Nakayama Alston CNA; 05/12/2020 11:31 AM) 05/12/2020 11:30 AM Weight: 268.25 lb Height: 65in Body Surface Area: 2.24 m Body Mass Index: 44.64 kg/m  Temp.: 97.29F  Pulse: 113 (Regular)  P.OX: 96% (Room air) BP: 120/70(Sitting, Left Arm, Standard)       Physical Exam (Pretty Weltman L. Freida Busman MD; 05/12/2020 12:04 PM) The physical exam findings are as follows: Note: Constitutional: No acute distress;  conversant; no deformities Neuro: alert and oriented; cranial nerves grossly in tact; no focal deficits Eyes: Moist conjunctiva; anicteric sclerae; extraocular movements in tact Neck: Trachea midline Lungs: Normal respiratory effort; lungs clear to auscultation bilaterally; symmetric chest wall expansion CV: Regular rate and rhythm; no murmurs; no pitting edema GI: Abdomen soft, nontender, nondistended; no masses or organomegaly MSK: Normal gait and station; no clubbing/cyanosis Psychiatric: Appropriate affect; alert and oriented 3    Assessment & Plan (Ariell Gunnels L. Freida Busman MD; 05/12/2020 12:06 PM) MYOSITIS (M60.9) Story: 20 yo female recently hospitalized with proximal muscle weakness, elevated CK and AKI. Referred for muscle biopsy for workup of suspected myositis. I will schedule her for biopsy of the right thigh muscle this Wednesday 2/16. Procedure details were discussed with the patient. She had a positive COVID test on 04/03/20 at [facility] (visible in Care Everywhere) so will not need COVID testing prior to surgery. She was instructed to remain NPO at midnight but may take her meds including prednisone the morning of surgery.  Sophronia Simas, MD [facility] General, Hepatobiliary and Pancreatic Surgery 05/12/20 12:08 PM

## 2020-05-12 NOTE — H&P (View-Only) (Signed)
History of Present Illness (Latecia Miler L. Dola Lunsford MD; 05/12/2020 12:02 PM) Patient words: HPI: Ms. Rachael Patton is a 20 yo female who was referred for a muscle biopsy for workup of myositis. She had COVID one month ago in early January, and later developed muscle weakness. She had progression of her symptoms and presented to the ED, where she was found to have significant elevation of her CK to 11,000. She was admitted and treated with IV fluid hydration and high dose steroids. Renal function improved with hydration. Weakness has improved slightly with steroids. She has seen Dr. Rice with rheumatology and was referred for muscle biopsy for suspected myositis. She endorses weakness in both her proximal arms and legs, but feels the right thigh is the weakest. She denies pain.  PMH: asthma, hypothyroid  PSH: left foot surgery  FHx: diabetes (mother)  Social: Occasional EtOH, nonsmoker.  The patient is a 20 year old female.   Allergies (Donyelle Alston, CNA; 05/12/2020 11:30 AM) No Known Allergies  [05/12/2020]: No Known Drug Allergies  [05/12/2020]: Allergies Reconciled   Medication History (Donyelle Alston, CNA; 05/12/2020 11:30 AM) No Current Medications Medications Reconciled    Review of Systems (Rachit Grim L. Jiovanni Heeter MD; 05/12/2020 12:03 PM) General Not Present- Chills and Fever. Respiratory Not Present- Dyspnea and Wheezing. Gastrointestinal Not Present- Abdominal Swelling. Musculoskeletal Present- Leg Weakness and Muscle Weakness. Neurological Not Present- Dizziness and Fainting.  Vitals (Donyelle Alston CNA; 05/12/2020 11:31 AM) 05/12/2020 11:30 AM Weight: 268.25 lb Height: 65in Body Surface Area: 2.24 m Body Mass Index: 44.64 kg/m  Temp.: 97.6F  Pulse: 113 (Regular)  P.OX: 96% (Room air) BP: 120/70(Sitting, Left Arm, Standard)       Physical Exam (Thetis Schwimmer L. Kaid Seeberger MD; 05/12/2020 12:04 PM) The physical exam findings are as follows: Note: Constitutional: No acute distress;  conversant; no deformities Neuro: alert and oriented; cranial nerves grossly in tact; no focal deficits Eyes: Moist conjunctiva; anicteric sclerae; extraocular movements in tact Neck: Trachea midline Lungs: Normal respiratory effort; lungs clear to auscultation bilaterally; symmetric chest wall expansion CV: Regular rate and rhythm; no murmurs; no pitting edema GI: Abdomen soft, nontender, nondistended; no masses or organomegaly MSK: Normal gait and station; no clubbing/cyanosis Psychiatric: Appropriate affect; alert and oriented 3    Assessment & Plan (Bernetta Sutley L. Laruth Hanger MD; 05/12/2020 12:06 PM) MYOSITIS (M60.9) Story: 20 yo female recently hospitalized with proximal muscle weakness, elevated CK and AKI. Referred for muscle biopsy for workup of suspected myositis. I will schedule her for biopsy of the right thigh muscle this Wednesday 2/16. Procedure details were discussed with the patient. She had a positive COVID test on 04/03/20 at [facility] (visible in Care Everywhere) so will not need COVID testing prior to surgery. She was instructed to remain NPO at midnight but may take her meds including prednisone the morning of surgery.  Margorie Renner, MD [facility] General, Hepatobiliary and Pancreatic Surgery 05/12/20 12:08 PM   

## 2020-05-12 NOTE — Telephone Encounter (Signed)
Yes, recommend continuing the current medications. The surgery is very superficial so should not be a problem for recovery or needing medication changes.

## 2020-05-12 NOTE — Telephone Encounter (Signed)
Spoke with patient, she is scheduled with the general surgeon today. Patient would like to know if she should continue taking Prednisone, iron supplement, Bactrim, and multi vitamin as normal. Please advise.

## 2020-05-13 ENCOUNTER — Encounter: Payer: Self-pay | Admitting: Physician Assistant

## 2020-05-13 ENCOUNTER — Encounter (HOSPITAL_COMMUNITY): Payer: Self-pay | Admitting: Surgery

## 2020-05-13 ENCOUNTER — Other Ambulatory Visit: Payer: Self-pay

## 2020-05-13 ENCOUNTER — Ambulatory Visit (INDEPENDENT_AMBULATORY_CARE_PROVIDER_SITE_OTHER): Payer: BLUE CROSS/BLUE SHIELD | Admitting: Physician Assistant

## 2020-05-13 VITALS — BP 138/80 | HR 55 | Temp 98.3°F | Ht 65.0 in | Wt 264.5 lb

## 2020-05-13 DIAGNOSIS — M60859 Other myositis, unspecified thigh: Secondary | ICD-10-CM

## 2020-05-13 MED ORDER — DEXTROSE 5 % IV SOLN
3.0000 g | INTRAVENOUS | Status: AC
  Administered 2020-05-14: 3 g via INTRAVENOUS
  Filled 2020-05-13: qty 3

## 2020-05-13 NOTE — Progress Notes (Signed)
Rachael Patton is a 20 y.o. female is here for follow up.  I acted as a Neurosurgeon for [employer], PA-C Rachael Mull, LPN   History of Present Illness:   Chief Complaint  Patient presents with  . Hospitalization Follow-up    HPI   Myositis Pt following up was admitted on 05/01/2020-05/04/2020 for proximal muscle weakness, elevated CK, transaminitis.  During hospitalization she was started on high-dose steroids as well as IV fluids.  She has from a symptom standpoint and is planning to attend physical therapy soon.  She is already had 1 scheduled outpatient follow-up with Dr. Sheliah Patton in rheumatology.  Her CK continues to trend down and 5 days ago was 3600.  He is planning to repeat in 2 weeks and he is starting to taper her prednisone.  She has a muscle biopsy scheduled tomorrow with the surgeon.  Her family has been a really good support system for her.  She is trying to remain optimistic about things.  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  She is hoping to obtain her masters soon.   Health Maintenance Due  Topic Date Due  . TETANUS/TDAP  Never done  . PAP-Cervical Cytology Screening  Never done  . PAP SMEAR-Modifier  Never done  . COVID-19 Vaccine (2 - Pfizer 3-dose series) 05/08/2020    Past Medical History:  Diagnosis Date  . Asthma    As a child  . Pre-diabetes   . Prediabetes   . Thyroid disease    found at age 20     Social History   Tobacco Use  . Smoking status: Never Smoker  . Smokeless tobacco: Never Used  Vaping Use  . Vaping Use: Never used  Substance Use Topics  . Alcohol use: Yes    Comment: once a month  . Drug use: Never    Past Surgical History:  Procedure Laterality Date  . FOOT SURGERY Left 06/2019    Family History  Problem Relation Age of Onset  . Diabetes Mother   .  Diabetes Maternal Grandmother   . Diabetes Paternal Grandmother   . Stroke Paternal Grandmother   . Stroke Paternal Grandfather   . Diabetes Paternal Grandfather   . Multiple sclerosis Cousin   . Lupus Cousin     PMHx, SurgHx, SocialHx, FamHx, Medications, and Allergies were reviewed in the Visit Navigator and updated as appropriate.   Patient Active Problem List   Diagnosis Date Noted  . Morbid obesity with BMI of 40.0-44.9, adult (HCC) 05/08/2020  . Myositis 05/08/2020  . High risk medication use 05/08/2020  . Volume overload 05/08/2020  . Oropharyngeal candidiasis 05/08/2020  . 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 Morbid obesity (HCC) 04/30/2020  . Prolactin increased 04/30/2020  . Hypothyroidism 04/30/2020  . Prediabetes 04/30/2020  . Iron deficiency anemia 04/30/2020  . Elevated LFTs 04/30/2020  . 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 Smoking status: Never Smoker  . Smokeless tobacco: Never Used  Vaping Use  . Vaping Use: Never used  Substance Use Topics  . Alcohol use: Yes    Comment: once a month  . Drug use: Never    Current Medications and Allergies:    Current Outpatient Medications:  .  ferrous gluconate (FERGON) 324 MG tablet, Take 324 mg  by mouth every morning., Disp: , Rfl:  .  fluconazole (DIFLUCAN) 100 MG tablet, Take 1 tablet (100 mg total) by mouth daily for 7 days., Disp: 7 tablet, Rfl: 0 .  guaiFENesin (ROBITUSSIN) 100 MG/5ML liquid, Take 200 mg by mouth 3 (three) times daily as needed for cough., Disp: , Rfl:  .  Multiple Vitamin (MULTIVITAMIN) tablet, Take 1 tablet by mouth daily., Disp: , Rfl:  .  predniSONE (DELTASONE) 20 MG tablet, Take 2 tablets in AM and 1 tablet in PM daily (60mg  total daily dose) (Patient taking differently: Take 20-40 mg by mouth See admin instructions. Take 2 tablets in AM and 1 tablet in PM daily (60mg  total daily dose)), Disp: 120 tablet, Rfl: 0 .  sulfamethoxazole-trimethoprim (BACTRIM)  400-80 MG tablet, Take 1 tablet by mouth 3 (three) times a week. For prophylaxis on high dose prednisone., Disp: 30 tablet, Rfl: 0 .  triamcinolone (KENALOG) 0.1 %, Apply to affected area 1-2 times daily (Patient taking differently: Apply 1 application topically daily.), Disp: 30 g, Rfl: 0 No current facility-administered medications for this visit.  Facility-Administered Medications Ordered in Other Visits:  .  [START ON 05/14/2020] ceFAZolin (ANCEF) 3 g in dextrose 5 % 50 mL IVPB, 3 g, Intravenous, On Call to OR, , MD  No Known Allergies  Review of Systems   ROS Negative unless otherwise specified per HPI.  Vitals:   Vitals:   05/13/20 1511  BP: 138/80  Pulse: (!) 55  Temp: 98.3 F (36.8 C)  TempSrc: Temporal  SpO2: 97%  Weight: 264 lb 8 oz (120 kg)  Height: 5\' 5"  (1.651 m)     Body mass index is 44.02 kg/m.   Physical Exam:    Physical Exam Vitals and nursing note reviewed.  Constitutional:      General: She is not in acute distress.    Appearance: She is well-developed. She is not ill-appearing, toxic-appearing or sickly-appearing.  Cardiovascular:     Rate and Rhythm: Normal rate and regular rhythm.     Pulses: Normal pulses.     Heart sounds: Normal heart sounds, S1 normal and S2 normal.     Comments: No LE edema Pulmonary:     Effort: Pulmonary effort is normal.     Breath sounds: Normal breath sounds.  Skin:    General: Skin is warm, dry and intact.  Neurological:     Mental Status: She is alert.     GCS: GCS eye subscore is 4. GCS verbal subscore is 5. GCS motor subscore is 6.  Psychiatric:        Mood and Affect: Mood and affect normal.        Speech: Speech normal.        Behavior: Behavior normal. Behavior is cooperative.      Assessment and Plan:    Rachael Patton was seen today for hospitalization follow-up.  Diagnoses and all orders for this visit:  Myositis of thigh, unspecified laterality, unspecified myositis type   Patient  is improving daily. Further work-up and management per Dr. Fritzi Patton. Patient declines any needs at this time. Available for follow-up as needed.  CMA or LPN served as scribe during this visit. History, Physical, and Plan performed by medical provider. The above documentation has been reviewed and is accurate and complete.  05/15/20, PA-C [place] [place] 05/13/2020  Follow-up: No follow-ups on file.

## 2020-05-13 NOTE — Patient Instructions (Signed)
It was great to see you!  Follow-up as needed with me. Reach out any time.  Take care,  [name] PA-C

## 2020-05-13 NOTE — Progress Notes (Signed)
Ms Rachael Patton denies chest pain or shortness of breath. Ms. Rachael Patton tested positive for  Covid 04/03/20 and will not have to retested .  Ms Rachael Patton has pre- diabetes, she does not check CBG.  Ms Rachael Patton takes 60 mg of Prednisone. Patient said that Dr. Freida Busman instructed her to wake up at 0600 , eat 1 cracker and take the Prednisone.  I spoke with Dr. Nance Pew, he said that is ok for [patient to eat a cracker at 0600 and take medications.

## 2020-05-13 NOTE — Progress Notes (Signed)
Anesthesia Chart Review: SAME DAY WORK-UP   Case: [id number] Date/Time: 05/14/20 1545   Procedure: RIGHT THIGH MUSCLE BIOPSY (Right ) - ROOM 3 STARTING AT 04:00PM FOR   Anesthesia type: Choice   Pre-op diagnosis: MYOSITIS   [facility] ROOM 08 / [facility]   Surgeons: Rachael Mandes, MD      DISCUSSION: Patient is a 20 year old scheduled for the above procedure.  History includes never smoker, prediabetes, childhood asthma, thyroid disease (not specified; TSH normal 3.741 05/01/20), morbid obesity, COVID-19 04/03/20 [facility]). Elevated AST (211) and ALT (171) with reactive hepatitis A IgG and IgM antibody 04/04/20 ([facility])--non-reactive hepatitis panel 05/08/20 [facility]).    She presented to Rachael Patton, [place] on 04/30/20 to establish care. Reported + COVID-19 04/03/20 and had since developed weakness in upper thighs. Also non-puritic rash in inner arms. Reported vague history of positive autoimmune testing as a child/teenager. On 05/01/20, she was instructed to go to the ED after lab resulted showing persistently elevated AST (273) and ALT (175), Mg 2.0, WBC 14.6, H/H 11.3/35.6, sed rate 60, CRP 5.1, rheumatoid factor < 14, negative ANA, CK significantly elevated at 9,940. Consider complication after COVID-19 illness versus rhabdomyolysis versus autoimmune disorder versus neurologic disorder, considered myasthenia gravis versus dermatomyositis. She was treated with steroids, IVF. LFTs and CK trending downward and strength improving. Discharged 05/06/19 on high dose prednisone and rheumatology out-patient follow-up.   Seen by Dr. Dimple Patton on 05/08/20 for post-hospital follow-up for new onset myositis suspicious for either provoked by recent COVID infection or underlying inflammatory process. She had further improvement in LFTs and CK. G6PDH 25.5 (7.0-20.5). Non-reactive hepatitis panel. She was referred to general surgery for muscle biopsy for definitive diagnosis. Prednisone dose decreased. Bactrim prescribed for  prophylaxis on high dose prednisone. Diflucan prescribed for oral candidiasis.   Anesthesia team to evaluate on the day of surgery.    VS: LMP 05/08/2020   Wt Readings from Last 3 Encounters:  05/13/20 120 kg  05/08/20 120.2 kg  05/01/20 119.7 kg   BP Readings from Last 3 Encounters:  05/13/20 138/80  05/08/20 (!) 141/88  05/05/20 118/76   Pulse Readings from Last 3 Encounters:  05/13/20 (!) 55  05/08/20 79  05/05/20 60    PROVIDERS: Rachael Motto, PA is PCP  Sheliah Hatch, MD is rheumatologist   LABS: Last lab results include:  Lab Results  Component Value Date   WBC 14.9 (H) 05/05/2020   HGB 9.9 (L) 05/05/2020   HCT 30.6 (L) 05/05/2020   PLT 360 05/05/2020   GLUCOSE 123 (H) 05/08/2020   ALT 123 (H) 05/08/2020   AST 91 (H) 05/08/2020   NA 139 05/08/2020   K 4.2 05/08/2020   CL 108 05/08/2020   CREATININE 0.59 05/08/2020   BUN 10 05/08/2020   CO2 25 05/08/2020   TSH 3.741 05/01/2020   INR 1.1 05/01/2020   HGBA1C 5.8 (H) 05/02/2020  LFTs continue to trend downward.   IMAGES: CXR 05/01/20: FINDINGS: The heart size and mediastinal contours are within normal limits. Both lungs are clear. The visualized skeletal structures are unremarkable. IMPRESSION: No active cardiopulmonary disease.  [place] Abd (limited) 05/01/20: IMPRESSION: Normal appearing gallbladder. Mild hepatic steatosis   EKG: N/A   CV: N/A  Past Medical History:  Diagnosis Date  . Anemia   . Asthma    As a child  . Pre-diabetes   . Prediabetes   . Rhabdomyolysis   . Thyroid disease    found at age 5  Past Surgical History:  Procedure Laterality Date  . FOOT SURGERY Left 06/2019    MEDICATIONS: . Melene Muller ON 05/14/2020] ceFAZolin (ANCEF) 3 g in dextrose 5 % 50 mL IVPB   . ferrous gluconate (FERGON) 324 MG tablet  . fluconazole (DIFLUCAN) 100 MG tablet  . guaiFENesin (ROBITUSSIN) 100 MG/5ML liquid  . Multiple Vitamin (MULTIVITAMIN) tablet  . predniSONE (DELTASONE) 20 MG  tablet  . sulfamethoxazole-trimethoprim (BACTRIM) 400-80 MG tablet  . triamcinolone (KENALOG) 0.1 %    Rachael Chock, PA-C Surgical Short Stay/Anesthesiology [facility] Phone [phone] [facility] Phone [phone] 05/13/2020 5:18 PM

## 2020-05-13 NOTE — Anesthesia Preprocedure Evaluation (Addendum)
Anesthesia Evaluation  Patient identified by MRN, date of birth, ID band Patient awake    Reviewed: Allergy & Precautions, NPO status , Patient's Chart, lab work & pertinent test results  History of Anesthesia Complications Negative for: history of anesthetic complications  Airway Mallampati: II  TM Distance: >3 FB Neck ROM: Full    Dental  (+) Missing,    Pulmonary asthma ,    Pulmonary exam normal        Cardiovascular negative cardio ROS Normal cardiovascular exam     Neuro/Psych myositis negative psych ROS   GI/Hepatic negative GI ROS, Neg liver ROS,   Endo/Other  Hypothyroidism Morbid obesity  Renal/GU negative Renal ROS  negative genitourinary   Musculoskeletal negative musculoskeletal ROS (+)   Abdominal   Peds  Hematology  (+) anemia ,   Anesthesia Other Findings Day of surgery medications reviewed with patient.  Reproductive/Obstetrics negative OB ROS                            Anesthesia Physical Anesthesia Plan  ASA: III  Anesthesia Plan: General   Post-op Pain Management:    Induction: Intravenous  PONV Risk Score and Plan: Treatment may vary due to age or medical condition, Ondansetron, Dexamethasone and Midazolam  Airway Management Planned: LMA  Additional Equipment: None  Intra-op Plan:   Post-operative Plan: Extubation in OR  Informed Consent: I have reviewed the patients History and Physical, chart, labs and discussed the procedure including the risks, benefits and alternatives for the proposed anesthesia with the patient or authorized representative who has indicated his/her understanding and acceptance.     Dental advisory given  Plan Discussed with: CRNA  Anesthesia Plan Comments: (PAT note written 05/13/2020 by Shonna Chock, PA-C. )      Anesthesia Quick Evaluation

## 2020-05-14 ENCOUNTER — Ambulatory Visit (HOSPITAL_COMMUNITY): Payer: BLUE CROSS/BLUE SHIELD | Admitting: Vascular Surgery

## 2020-05-14 ENCOUNTER — Encounter (HOSPITAL_COMMUNITY)
Admission: RE | Disposition: A | Discharge status: Home / Self Care | Payer: Self-pay | Source: Home / Self Care | Attending: Surgery

## 2020-05-14 ENCOUNTER — Ambulatory Visit (HOSPITAL_COMMUNITY)
Admission: RE | Admit: 2020-05-14 | Discharge: 2020-05-14 | Disposition: A | Discharge status: Home / Self Care | Payer: BLUE CROSS/BLUE SHIELD | Attending: Surgery | Admitting: Surgery

## 2020-05-14 DIAGNOSIS — Z792 Long term (current) use of antibiotics: Secondary | ICD-10-CM | POA: Insufficient documentation

## 2020-05-14 DIAGNOSIS — G7249 Other inflammatory and immune myopathies, not elsewhere classified: Secondary | ICD-10-CM | POA: Insufficient documentation

## 2020-05-14 DIAGNOSIS — Z8616 Personal history of COVID-19: Secondary | ICD-10-CM | POA: Diagnosis not present

## 2020-05-14 DIAGNOSIS — Z7952 Long term (current) use of systemic steroids: Secondary | ICD-10-CM | POA: Insufficient documentation

## 2020-05-14 DIAGNOSIS — M6281 Muscle weakness (generalized): Secondary | ICD-10-CM | POA: Diagnosis present

## 2020-05-14 DIAGNOSIS — Z6841 Body Mass Index (BMI) 40.0 and over, adult: Secondary | ICD-10-CM | POA: Diagnosis not present

## 2020-05-14 DIAGNOSIS — Z79899 Other long term (current) drug therapy: Secondary | ICD-10-CM | POA: Insufficient documentation

## 2020-05-14 HISTORY — DX: Prediabetes: R73.03

## 2020-05-14 HISTORY — DX: Rhabdomyolysis: M62.82

## 2020-05-14 HISTORY — DX: Anemia, unspecified: D64.9

## 2020-05-14 HISTORY — PX: MUSCLE BIOPSY: SHX716

## 2020-05-14 LAB — GLUCOSE, CAPILLARY: Glucose-Capillary: 102 mg/dL — ABNORMAL HIGH (ref 70–99)

## 2020-05-14 LAB — POCT PREGNANCY, URINE: Preg Test, Ur: NEGATIVE

## 2020-05-14 SURGERY — MUSCLE BIOPSY
Anesthesia: General | Site: Thigh | Laterality: Right

## 2020-05-14 MED ORDER — MIDAZOLAM HCL 2 MG/2ML IJ SOLN
INTRAMUSCULAR | Status: AC
  Filled 2020-05-14: qty 2

## 2020-05-14 MED ORDER — OXYCODONE HCL 5 MG PO TABS
ORAL_TABLET | ORAL | Status: AC
  Filled 2020-05-14: qty 1

## 2020-05-14 MED ORDER — LIDOCAINE 2% (20 MG/ML) 5 ML SYRINGE
INTRAMUSCULAR | Status: AC
  Filled 2020-05-14: qty 5

## 2020-05-14 MED ORDER — FENTANYL CITRATE (PF) 250 MCG/5ML IJ SOLN
INTRAMUSCULAR | Status: AC
  Filled 2020-05-14: qty 5

## 2020-05-14 MED ORDER — LIDOCAINE 2% (20 MG/ML) 5 ML SYRINGE
INTRAMUSCULAR | Status: DC | PRN
  Administered 2020-05-14: 100 mg via INTRAVENOUS

## 2020-05-14 MED ORDER — DEXAMETHASONE SODIUM PHOSPHATE 10 MG/ML IJ SOLN
INTRAMUSCULAR | Status: DC | PRN
  Administered 2020-05-14: 5 mg via INTRAVENOUS

## 2020-05-14 MED ORDER — DEXAMETHASONE SODIUM PHOSPHATE 10 MG/ML IJ SOLN
INTRAMUSCULAR | Status: AC
  Filled 2020-05-14: qty 4

## 2020-05-14 MED ORDER — ONDANSETRON HCL 4 MG/2ML IJ SOLN
INTRAMUSCULAR | Status: AC
  Filled 2020-05-14: qty 4

## 2020-05-14 MED ORDER — ORAL CARE MOUTH RINSE: 15.0000 mL | Freq: Once | OROMUCOSAL | Status: AC

## 2020-05-14 MED ORDER — EPHEDRINE 5 MG/ML INJ
INTRAVENOUS | Status: AC
  Filled 2020-05-14: qty 10

## 2020-05-14 MED ORDER — TRAMADOL HCL 50 MG PO TABS: 50.0000 mg | ORAL_TABLET | Freq: Four times a day (QID) | ORAL | 0 refills | Status: DC | PRN

## 2020-05-14 MED ORDER — OXYCODONE HCL 5 MG/5ML PO SOLN: 5.0000 mg | Freq: Once | ORAL | Status: AC | PRN

## 2020-05-14 MED ORDER — PROMETHAZINE HCL 25 MG/ML IJ SOLN: 6.2500 mg | INTRAMUSCULAR | Status: DC | PRN

## 2020-05-14 MED ORDER — LACTATED RINGERS IV SOLN: INTRAVENOUS | Status: DC

## 2020-05-14 MED ORDER — MIDAZOLAM HCL 5 MG/5ML IJ SOLN
INTRAMUSCULAR | Status: DC | PRN
  Administered 2020-05-14 (×2): 1 mg via INTRAVENOUS

## 2020-05-14 MED ORDER — FENTANYL CITRATE (PF) 100 MCG/2ML IJ SOLN: 25.0000 ug | INTRAMUSCULAR | Status: DC | PRN

## 2020-05-14 MED ORDER — BUPIVACAINE-EPINEPHRINE (PF) 0.25% -1:200000 IJ SOLN
INTRAMUSCULAR | Status: AC
  Filled 2020-05-14: qty 30

## 2020-05-14 MED ORDER — ACETAMINOPHEN 500 MG PO TABS
1000.0000 mg | ORAL_TABLET | Freq: Once | ORAL | Status: AC
  Administered 2020-05-14: 1000 mg via ORAL
  Filled 2020-05-14: qty 2

## 2020-05-14 MED ORDER — PROPOFOL 10 MG/ML IV BOLUS
INTRAVENOUS | Status: DC | PRN
  Administered 2020-05-14: 200 mg via INTRAVENOUS

## 2020-05-14 MED ORDER — CHLORHEXIDINE GLUCONATE 0.12 % MT SOLN
15.0000 mL | Freq: Once | OROMUCOSAL | Status: AC
  Administered 2020-05-14: 15 mL via OROMUCOSAL
  Filled 2020-05-14: qty 15

## 2020-05-14 MED ORDER — BUPIVACAINE-EPINEPHRINE 0.25% -1:200000 IJ SOLN
INTRAMUSCULAR | Status: DC | PRN
  Administered 2020-05-14: 10 mL

## 2020-05-14 MED ORDER — PROPOFOL 10 MG/ML IV BOLUS
INTRAVENOUS | Status: AC
  Filled 2020-05-14: qty 20

## 2020-05-14 MED ORDER — OXYCODONE HCL 5 MG PO TABS
5.0000 mg | ORAL_TABLET | Freq: Once | ORAL | Status: AC | PRN
  Administered 2020-05-14: 5 mg via ORAL

## 2020-05-14 MED ORDER — 0.9 % SODIUM CHLORIDE (POUR BTL) OPTIME
TOPICAL | Status: DC | PRN
  Administered 2020-05-14: 1000 mL

## 2020-05-14 MED ORDER — ONDANSETRON HCL 4 MG/2ML IJ SOLN
INTRAMUSCULAR | Status: DC | PRN
  Administered 2020-05-14: 4 mg via INTRAVENOUS

## 2020-05-14 MED ORDER — FENTANYL CITRATE (PF) 250 MCG/5ML IJ SOLN
INTRAMUSCULAR | Status: DC | PRN
  Administered 2020-05-14: 100 ug via INTRAVENOUS
  Administered 2020-05-14: 25 ug via INTRAVENOUS

## 2020-05-14 SURGICAL SUPPLY — 32 items
CANISTER SUCT 3000ML PPV (MISCELLANEOUS) IMPLANT
CHLORAPREP W/TINT 10.5 ML (MISCELLANEOUS) ×2 IMPLANT
CHLORAPREP W/TINT 26 (MISCELLANEOUS) ×2 IMPLANT
CNTNR URN SCR LID CUP LEK RST (MISCELLANEOUS) ×2 IMPLANT
CONT SPEC 4OZ STRL OR WHT (MISCELLANEOUS) ×2
COVER SURGICAL LIGHT HANDLE (MISCELLANEOUS) ×2 IMPLANT
COVER WAND RF STERILE (DRAPES) IMPLANT
DERMABOND ADVANCED (GAUZE/BANDAGES/DRESSINGS) ×1
DERMABOND ADVANCED .7 DNX12 (GAUZE/BANDAGES/DRESSINGS) ×1 IMPLANT
DRAPE LAPAROTOMY 100X72 PEDS (DRAPES) IMPLANT
DRSG TELFA 3X8 NADH (GAUZE/BANDAGES/DRESSINGS) IMPLANT
ELECT CAUTERY BLADE 6.4 (BLADE) IMPLANT
ELECT REM PT RETURN 9FT ADLT (ELECTROSURGICAL)
ELECTRODE REM PT RTRN 9FT ADLT (ELECTROSURGICAL) IMPLANT
GAUZE 4X4 16PLY RFD (DISPOSABLE) ×2 IMPLANT
GLOVE BIO SURGEON STRL SZ7 (GLOVE) ×2 IMPLANT
GLOVE BIOGEL PI IND STRL 7.5 (GLOVE) ×1 IMPLANT
GLOVE BIOGEL PI INDICATOR 7.5 (GLOVE) ×1
GOWN STRL REUS W/ TWL LRG LVL3 (GOWN DISPOSABLE) ×2 IMPLANT
GOWN STRL REUS W/TWL LRG LVL3 (GOWN DISPOSABLE) ×2
KIT BASIN OR (CUSTOM PROCEDURE TRAY) ×2 IMPLANT
KIT TURNOVER KIT B (KITS) ×2 IMPLANT
NEEDLE HYPO 25GX1X1/2 BEV (NEEDLE) ×2 IMPLANT
NS IRRIG 1000ML POUR BTL (IV SOLUTION) ×2 IMPLANT
PACK GENERAL/GYN (CUSTOM PROCEDURE TRAY) ×2 IMPLANT
PAD ARMBOARD 7.5X6 YLW CONV (MISCELLANEOUS) ×4 IMPLANT
PENCIL SMOKE EVACUATOR (MISCELLANEOUS) ×2 IMPLANT
SUT MON AB 4-0 PC3 18 (SUTURE) ×2 IMPLANT
SUT VIC AB 3-0 SH 18 (SUTURE) ×2 IMPLANT
SYR CONTROL 10ML LL (SYRINGE) ×2 IMPLANT
TOWEL GREEN STERILE (TOWEL DISPOSABLE) ×2 IMPLANT
TOWEL GREEN STERILE FF (TOWEL DISPOSABLE) ×2 IMPLANT

## 2020-05-14 NOTE — Interval H&P Note (Signed)
History and Physical Interval Note:  05/14/2020 1:58 PM  Rachael Patton  has presented today for surgery, with the diagnosis of MYOSITIS.  The various methods of treatment have been discussed with the patient and family. After consideration of risks, benefits and other options for treatment, the patient has consented to  Procedure(s) with comments: RIGHT THIGH MUSCLE BIOPSY (Right) - ROOM 3 STARTING AT 04:00PM FOR as a surgical intervention.  The patient's history has been reviewed, patient examined, no change in status, stable for surgery.  I have reviewed the patient's chart and labs.  Questions were answered to the patient's satisfaction.     Fritzi Mandes

## 2020-05-14 NOTE — Op Note (Signed)
Date: 05/14/20  Patient: Rachael Patton MRN: [id number]  Preoperative Diagnosis: Muscle weakness, possible myositis Postoperative Diagnosis: Same  Procedure: Biopsy of right thigh muscle  Surgeon: Sophronia Simas, MD Assistant: Chilton Greathouse, MD (Resident)  EBL: Minimal  Anesthesia: General LMA  Specimens: Right thigh muscle  Indications: [name] is a 20 yo female who recently developed proximal muscle weakness with associated elevation in CK. She has been evaluated by rheumatology with suspicion for myositis. She was referred to me for a muscle biopsy.  Findings: Biopsy taken from right vastus medialis muscle.  Procedure details: Informed consent was obtained in the preoperative area prior to the procedure. The patient was brought to the operating room and placed on the table in the supine position. General anesthesia was induced and appropriate lines and drains were placed for intraoperative monitoring. Perioperative antibiotics were administered per SCIP guidelines. The right thigh was prepped and draped in the usual sterile fashion. A pre-procedure timeout was taken verifying patient identity, surgical site and procedure to be performed.  A small longitudinal skin incision was made on the right anteromedial thigh. The subcutaneous tissue was divided with cautery to expose the muscle fascia. The fascia was opened with cautery. A sample of the vastus medialis muscle was then taken with cautery, measuring about 2x1cm. This was sent fresh to pathology for myositis evaluation. Hemostasis was achieved in the remaining muscle bed using cautery. The muscle fascia was closed with interrupted 3-0 vicryl suture. The subcutaneous tissue and deep dermal layers were closed with interrupted 3-0 vicryl, and the skin was closed with 4-0 monocryl suture. Dermabond was applied. The patient tolerated the procedure well with no apparent complications.  The patient tolerated the procedure well  with no apparent complications. All counts were correct x2 at the end of the procedure. The patient was extubated and taken to PACU in stable condition.  Sophronia Simas, MD 05/14/20 4:28 PM

## 2020-05-14 NOTE — Anesthesia Postprocedure Evaluation (Signed)
Anesthesia Post Note  Patient: Rachael Patton  Procedure(s) Performed: RIGHT THIGH MUSCLE BIOPSY (Right Thigh)     Patient location during evaluation: PACU Anesthesia Type: General Level of consciousness: awake and alert and oriented Pain management: pain level controlled Vital Signs Assessment: post-procedure vital signs reviewed and stable Respiratory status: spontaneous breathing, nonlabored ventilation and respiratory function stable Cardiovascular status: blood pressure returned to baseline Postop Assessment: no apparent nausea or vomiting Anesthetic complications: no   No complications documented.  Last Vitals:  Vitals:   05/14/20 1625 05/14/20 1655  BP: 113/63 117/72  Pulse: 72 (!) 50  Resp: (!) 21 (!) 21  Temp: 36.6 C   SpO2: 100% 99%    Last Pain:  Vitals:   05/14/20 1655  TempSrc:   PainSc: 4                  Kaylyn Layer

## 2020-05-14 NOTE — Anesthesia Procedure Notes (Signed)
Procedure Name: LMA Insertion Date/Time: 05/14/2020 3:37 PM Performed by: Audie Pinto, CRNA Pre-anesthesia Checklist: Patient identified, Emergency Drugs available, Suction available and Patient being monitored Patient Re-evaluated:Patient Re-evaluated prior to induction Oxygen Delivery Method: Circle system utilized Preoxygenation: Pre-oxygenation with 100% oxygen Induction Type: IV induction Ventilation: Mask ventilation without difficulty LMA: LMA inserted LMA Size: 4.0 Placement Confirmation: positive ETCO2 Dental Injury: Teeth and Oropharynx as per pre-operative assessment

## 2020-05-14 NOTE — Transfer of Care (Signed)
Immediate Anesthesia Transfer of Care Note  Patient: Rachael Patton  Procedure(s) Performed: RIGHT THIGH MUSCLE BIOPSY (Right Thigh)  Patient Location: PACU  Anesthesia Type:General  Level of Consciousness: drowsy and patient cooperative  Airway & Oxygen Therapy: Patient Spontanous Breathing and Patient connected to face mask oxygen  Post-op Assessment: Report given to RN and Post -op Vital signs reviewed and stable  Post vital signs: Reviewed  Last Vitals:  Vitals Value Taken Time  BP 113/63 05/14/20 1624  Temp    Pulse 70 05/14/20 1625  Resp 24 05/14/20 1625  SpO2 100 % 05/14/20 1625  Vitals shown include unvalidated device data.  Last Pain:  Vitals:   05/14/20 1414  TempSrc:   PainSc: 0-No pain         Complications: No complications documented.

## 2020-05-14 NOTE — Discharge Instructions (Signed)
[facility] [place] [facility] DISCHARGE INSTRUCTIONS  Activity  Ok to shower 24 hours after surgery, but do not bathe or submerge incisions underwater.  Do not drive while taking narcotic pain medication.  Wound Care  Your incision is covered with skin glue called Dermabond. This will peel off on its own over time.  You may shower and allow warm soapy water to run over your incisions. Gently pat dry.  Do not submerge your incision underwater.  Monitor your incision for any new redness, tenderness, or drainage.  When to Call us:  Fever greater than 100.5  New redness, drainage, or swelling at incision site  Severe pain, nausea, or vomiting  Follow-up You have an appointment scheduled with Dr. Freida Busman on June 10, 2020 at 10:45am. This will be at the [facility] Surgery office at [address], [address], [place], [place]. Please arrive at least 15 minutes prior to your scheduled appointment time.  For questions or concerns, please call the office at [phone].

## 2020-05-15 ENCOUNTER — Encounter (HOSPITAL_COMMUNITY): Payer: Self-pay | Admitting: Surgery

## 2020-05-24 LAB — COMPLETE METABOLIC PANEL WITH GFR
AG Ratio: 0.9 (calc) — ABNORMAL LOW (ref 1.0–2.5)
ALT: 123 U/L — ABNORMAL HIGH (ref 6–29)
AST: 91 U/L — ABNORMAL HIGH (ref 10–30)
Albumin: 3.2 g/dL — ABNORMAL LOW (ref 3.6–5.1)
Alkaline phosphatase (APISO): 49 U/L (ref 31–125)
BUN: 10 mg/dL (ref 7–25)
CO2: 25 mmol/L (ref 20–32)
Calcium: 8.6 mg/dL (ref 8.6–10.2)
Chloride: 108 mmol/L (ref 98–110)
Creat: 0.59 mg/dL (ref 0.50–1.10)
GFR, Est African American: 152 mL/min/{1.73_m2} (ref >=60)
GFR, Est Non African American: 131 mL/min/{1.73_m2} (ref >=60)
Globulin: 3.5 g/dL (calc) (ref 1.9–3.7)
Glucose, Bld: 123 mg/dL — ABNORMAL HIGH (ref 65–99)
Potassium: 4.2 mmol/L (ref 3.5–5.3)
Sodium: 139 mmol/L (ref 135–146)
Total Bilirubin: 0.4 mg/dL (ref 0.2–1.2)
Total Protein: 6.7 g/dL (ref 6.1–8.1)

## 2020-05-24 LAB — MYOSITIS ASSESSR PLUS JO-1 AUTOABS
EJ Autoabs: NOT DETECTED
Jo-1 Autoabs: <1 AI
Ku Autoabs: NOT DETECTED
Mi-2 Autoabs: NOT DETECTED
OJ Autoabs: NOT DETECTED
PL-12 Autoabs: DETECTED — AB
PL-7 Autoabs: NOT DETECTED
SRP Autoabs: NOT DETECTED

## 2020-05-24 LAB — CK: Total CK: 3621 U/L — ABNORMAL HIGH (ref 29–143)

## 2020-05-24 LAB — GLUCOSE 6 PHOSPHATE DEHYDROGENASE: G-6PDH: 25.5 U/g Hgb — ABNORMAL HIGH (ref 7.0–20.5)

## 2020-05-24 LAB — HEPATITIS PANEL, ACUTE
Hep A IgM: NONREACTIVE
Hep B C IgM: NONREACTIVE
Hepatitis B Surface Ag: NONREACTIVE
Hepatitis C Ab: NONREACTIVE
SIGNAL TO CUT-OFF: 0.02 (ref <1.00)

## 2020-05-25 NOTE — Progress Notes (Signed)
Office Visit Note  Patient: Rachael Patton             Date of Birth: 2001/01/16           MRN: [id number]             PCP: [name], PA Referring: [name], PA Visit Date: 05/26/2020   Subjective:  Follow-up (Patient notices improvement in symptoms since last visit. )   History of Present Illness: Rachael Patton is a 20 y.o. 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 She underwent right thigh muscle biopsy since the last visit. Labs were checked showing CK is responding to treatment also G6PD is good. 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 Since the last visit she describes a good improvement in her strength and is now feeling well. There is still a bit weaker than her baseline. She has swelling a lot from the steroids and a lot of appetite increase, otherwise tolerating okay. Her oral thrush cleared up.   Review of Systems  Constitutional: Negative for fatigue.  HENT: Negative for mouth sores, mouth dryness and nose dryness.   Eyes: Negative for pain, itching, visual disturbance and dryness.  Respiratory: Negative for cough, hemoptysis, shortness of breath and difficulty breathing.   Cardiovascular: Negative for chest pain, palpitations and swelling in legs/feet.  Gastrointestinal: Negative for abdominal pain, blood in stool, constipation and diarrhea.  Endocrine: Negative for increased urination.  Genitourinary: Negative for painful urination.  Musculoskeletal: Positive for arthralgias, joint pain and muscle tenderness. Negative for joint swelling, myalgias, muscle weakness, morning stiffness and myalgias.  Skin: Negative for color change, rash and redness.  Allergic/Immunologic: Negative for susceptible to infections.  Neurological: Positive for numbness. Negative for dizziness, headaches, memory loss and weakness.  Hematological: Negative for swollen glands.  Psychiatric/Behavioral: Negative for confusion and sleep  disturbance.     Previous HPI: History of Present Illness: Rachael Patton is a 20 y.o. female with history of hypothyroidism, elevated prolactin here for follow up of hospitalization last week with proximal muscle weakness found to have highly elevated CK and myoglobinuria. This was treated with IV fluids and started steroid treatment with improvement in CK from 11,796 to 4,047. She was discharged on prednisone 80mg  per day dose. Prior to this she was ill with COVID a month ago. She was having some type of GI illness in December not clear if this was related to COVID infection or separate process. Workup with labs on 1/6 and 1/7 showing transaminitis also positive Ab tests for COVID19 and Hepatitis A. Since the hospital visit she feels her symptoms are partially improved. She notices continued leg swelling and has some dyspnea with lying supine and on exertion. Skin rash on the face is slightly less warm and severe than before. She thinks strength is slightly better but not at her baseline at all.  Labs reviewed 05/05/2020 CK 4,047 K 3.1 Ca 8.2 WBC 14.9 Hgb 9.9  05/01/2020 HIV neg CK 11,796 Large myoglobinuria TSH 3.741 ANA neg RF neg  Imaging reviewed 05/01/2020 CXR No acute cardiopulmonary process  PMFS History:  Patient Active Problem List   Diagnosis Date Noted   Morbid obesity with BMI of 40.0-44.9, adult (HCC) 05/08/2020   Myositis 05/08/2020   High risk medication use 05/08/2020   Volume overload 05/08/2020   Morbid obesity (HCC) 04/30/2020   Prolactin increased 04/30/2020   Hypothyroidism 04/30/2020   Prediabetes 04/30/2020   Iron deficiency anemia 04/30/2020   Elevated LFTs  04/30/2020   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    Past Medical History:  Diagnosis Date   Anemia    Asthma    As a child   Pre-diabetes    Prediabetes    Rhabdomyolysis    Thyroid disease    found at age 89    Family History  Problem Relation Age of Onset    Diabetes Mother    Diabetes Maternal Grandmother    Diabetes Paternal Grandmother    Stroke Paternal Grandmother    Stroke Paternal Grandfather    Diabetes Paternal Grandfather    Multiple sclerosis Cousin    Lupus Cousin    Past Surgical History:  Procedure Laterality Date   FOOT SURGERY Left 06/2019   MUSCLE BIOPSY Right 05/14/2020   Procedure: RIGHT THIGH MUSCLE BIOPSY;  Surgeon: Fritzi Mandes, MD;  Location: [facility] OR;  Service: General;  Laterality: Right;  ROOM 3 STARTING AT 04:00PM FOR   Social History   Social History Narrative   Graduated from [employer] in sociology/toxicology   Starting day care job soon   Lives with boyfriend   No children   Immunization History  Administered Date(s) Administered   PFIZER(Purple Top)SARS-COV-2 Vaccination 04/17/2020     Objective: Vital Signs: BP 105/69 (BP Location: Right Arm, Patient Position: Sitting, Cuff Size: Normal)    Pulse (!) 103    Ht 5\' 5"  (1.651 m)    Wt 255 lb (115.7 kg)    LMP 05/08/2020    BMI 42.43 kg/m    Physical Exam Constitutional:      Appearance: She is obese.  HENT:     Right Ear: External ear normal.     Left Ear: External ear normal.     Mouth/Throat:     Mouth: Mucous membranes are moist.     Pharynx: Oropharynx is clear.  Eyes:     Conjunctiva/sclera: Conjunctivae normal.  Cardiovascular:     Rate and Rhythm: Regular rhythm. Tachycardia present.  Pulmonary:     Effort: Pulmonary effort is normal.     Breath sounds: Normal breath sounds.  Skin:    General: Skin is warm and dry.     Comments: Central facial edema Scattered peripheral facial hyperpigmentation Small patches of hyperpigmentation around elbows Pitting pedal edema  Neurological:     Mental Status: She is alert.     Deep Tendon Reflexes: Reflexes normal.     Comments: Strength feels 5 out of 5 on exam throughout except right hip flexion 4 out of 5  Psychiatric:        Mood and Affect: Mood normal.     Musculoskeletal  Exam:  Shoulders full ROM no tenderness or swelling Elbows full ROM no tenderness or swelling Wrists full ROM no tenderness or swelling Fingers full ROM no tenderness or swelling Knees full ROM no tenderness or swelling   Investigation: No additional findings.  Imaging: DG Chest 2 View  Result Date: 05/01/2020 CLINICAL DATA:  Elevated LFTs EXAM: CHEST - 2 VIEW COMPARISON:  None. FINDINGS: The heart size and mediastinal contours are within normal limits. Both lungs are clear. The visualized skeletal structures are unremarkable. IMPRESSION: No active cardiopulmonary disease. Electronically Signed   By: 06/29/2020 M.D.   On: 05/01/2020 20:54   06/29/2020 Abdomen Limited RUQ (LIVER/GB)  Result Date: 05/01/2020 CLINICAL DATA:  Increased LFTs EXAM: ULTRASOUND ABDOMEN LIMITED RIGHT UPPER QUADRANT COMPARISON:  None. FINDINGS: Gallbladder: No gallstones or wall thickening visualized. No sonographic Murphy sign noted  by sonographer. Common bile duct: Diameter: 2.5 mm Liver: No focal lesion identified. Mildly increased echogenicity seen throughout the liver parenchyma. Portal vein is patent on color Doppler imaging with normal direction of blood flow towards the liver. Other: None. IMPRESSION: Normal appearing gallbladder. Mild hepatic steatosis Electronically Signed   By: Jonna Clark M.D.   On: 05/01/2020 21:08    Recent Labs: Lab Results  Component Value Date   WBC 26.2 (H) 05/26/2020   HGB 12.6 05/26/2020   PLT 408 (H) 05/26/2020   NA 138 05/26/2020   K 4.9 05/26/2020   CL 101 05/26/2020   CO2 30 05/26/2020   GLUCOSE 109 (H) 05/26/2020   BUN 14 05/26/2020   CREATININE 0.62 05/26/2020   BILITOT 0.4 05/26/2020   ALKPHOS 38 05/04/2020   AST 33 (H) 05/26/2020   ALT 50 (H) 05/26/2020   PROT 6.8 05/26/2020   ALBUMIN 2.5 (L) 05/04/2020   CALCIUM 9.2 05/26/2020   GFRAA 149 05/26/2020    Speciality Comments: No specialty comments available.  Procedures:  No procedures performed Allergies:  Patient has no known allergies.   Assessment / Plan:     Visit Diagnoses: Myositis of thigh, unspecified laterality, unspecified myositis type - Plan: CK, COMPLETE METABOLIC PANEL WITH GFR, CBC with Differential/Platelet, Ambulatory referral to Pulmonology  Myositis clinically improving a lot since last visit on the high dose prednisone. Muscle biopsy was completed showing inflammatory muscle disease although pattern is not helpful with some endomysial and some perivascular involvement. 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 Recommend starting azathioprine 50mg  as steroid sparing DMARD and decreased prednisone to 40mg  tapering down to 20mg  over next month. 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  Prediabetes - Plan: CK, COMPLETE METABOLIC PANEL WITH GFR, CBC with Differential/Platelet  Seems to be tolerating the prednisone okay, checking CMP for glucose has been reasonable and renal function monitoring. She is actively working to stay well hydrated.  High risk medication use - Plan: CK, COMPLETE METABOLIC PANEL WITH GFR, CBC with Differential/Platelet  High-dose prednisone is associated with risk for infections, hyperglycemia, edema, weight gain and long-term risks such as cataracts glaucoma osteoporosis and avascular necrosis.  We are tapering this.  Azathioprine can be associated with cholestatic liver injury and cytopenias her G6PD was high we will monitor blood count and liver function test.  Elevated LFTs  LFTs have been persistently elevated secondary to muscle breakdown products.  However alkaline phosphatase has been within normal range so I do not expect any intrinsic liver problem.  We will continue to monitor with recommend start of azathioprine AST and ALT will be less informative.  Oropharyngeal  candidiasis  Resolved with oral medication and no recurrence so far on prednisone taper.   Orders: Orders Placed This Encounter  Procedures   CK   COMPLETE METABOLIC PANEL WITH GFR   CBC with Differential/Platelet   Ambulatory referral to Pulmonology   No orders of the defined types were placed in this encounter.    Follow-Up Instructions: Return in about 4 weeks (around 06/23/2020) for Myositis f/u.   , MD  Note - This record has been created using .  Chart creation errors have been sought, but may not always  have been located. Such creation errors do not reflect on  the standard of medical care.

## 2020-05-26 ENCOUNTER — Ambulatory Visit (INDEPENDENT_AMBULATORY_CARE_PROVIDER_SITE_OTHER): Payer: BLUE CROSS/BLUE SHIELD | Admitting: Internal Medicine

## 2020-05-26 ENCOUNTER — Other Ambulatory Visit: Payer: Self-pay

## 2020-05-26 ENCOUNTER — Encounter: Payer: Self-pay | Admitting: Internal Medicine

## 2020-05-26 VITALS — BP 105/69 | HR 103 | Ht 65.0 in | Wt 255.0 lb

## 2020-05-26 DIAGNOSIS — M60859 Other myositis, unspecified thigh: Secondary | ICD-10-CM

## 2020-05-26 DIAGNOSIS — Z79899 Other long term (current) drug therapy: Secondary | ICD-10-CM | POA: Diagnosis not present

## 2020-05-26 DIAGNOSIS — R7303 Prediabetes: Secondary | ICD-10-CM | POA: Diagnosis not present

## 2020-05-26 DIAGNOSIS — R7989 Other specified abnormal findings of blood chemistry: Secondary | ICD-10-CM | POA: Diagnosis not present

## 2020-05-26 LAB — CBC WITH DIFFERENTIAL/PLATELET
Absolute Monocytes: 1153 cells/uL — ABNORMAL HIGH (ref 200–950)
Basophils Absolute: 52 cells/uL (ref 0–200)
Basophils Relative: 0.2 %
Eosinophils Absolute: 52 cells/uL (ref 15–500)
Eosinophils Relative: 0.2 %
HCT: 38.8 % (ref 35.0–45.0)
Hemoglobin: 12.6 g/dL (ref 11.7–15.5)
Lymphs Abs: 1336 cells/uL (ref 850–3900)
MCH: 26.1 pg — ABNORMAL LOW (ref 27.0–33.0)
MCHC: 32.5 g/dL (ref 32.0–36.0)
MCV: 80.3 fL (ref 80.0–100.0)
MPV: 11.5 fL (ref 7.5–12.5)
Monocytes Relative: 4.4 %
Neutro Abs: 23606 cells/uL — ABNORMAL HIGH (ref 1500–7800)
Neutrophils Relative %: 90.1 %
Platelets: 408 10*3/uL — ABNORMAL HIGH (ref 140–400)
RBC: 4.83 10*6/uL (ref 3.80–5.10)
RDW: 16.3 % — ABNORMAL HIGH (ref 11.0–15.0)
Total Lymphocyte: 5.1 %
WBC: 26.2 10*3/uL — ABNORMAL HIGH (ref 3.8–10.8)

## 2020-05-26 LAB — COMPLETE METABOLIC PANEL WITH GFR
AG Ratio: 1.1 (calc) (ref 1.0–2.5)
ALT: 50 U/L — ABNORMAL HIGH (ref 6–29)
AST: 33 U/L — ABNORMAL HIGH (ref 10–30)
Albumin: 3.6 g/dL (ref 3.6–5.1)
Alkaline phosphatase (APISO): 84 U/L (ref 31–125)
BUN: 14 mg/dL (ref 7–25)
CO2: 30 mmol/L (ref 20–32)
Calcium: 9.2 mg/dL (ref 8.6–10.2)
Chloride: 101 mmol/L (ref 98–110)
Creat: 0.62 mg/dL (ref 0.50–1.10)
GFR, Est African American: 149 mL/min/{1.73_m2} (ref >=60)
GFR, Est Non African American: 129 mL/min/{1.73_m2} (ref >=60)
Globulin: 3.2 g/dL (calc) (ref 1.9–3.7)
Glucose, Bld: 109 mg/dL — ABNORMAL HIGH (ref 65–99)
Potassium: 4.9 mmol/L (ref 3.5–5.3)
Sodium: 138 mmol/L (ref 135–146)
Total Bilirubin: 0.4 mg/dL (ref 0.2–1.2)
Total Protein: 6.8 g/dL (ref 6.1–8.1)

## 2020-05-26 LAB — CK: Total CK: 642 U/L — ABNORMAL HIGH (ref 29–143)

## 2020-05-26 NOTE — Progress Notes (Signed)
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 Plan to refer for pulmonology assessment.

## 2020-05-26 NOTE — Patient Instructions (Signed)
>I recommend we start decreasing your prednisone medication dose to reduce the amount of side effects such as weight gain, swelling, high blood sugar, and risk of infections:  40mg  per day (1 tablet twice daily) for 1 week; 30mg  (1 tablet in AM and 1/2 tablet in PM) daily for 1 week; 20mg  (1 tablet daily) and continue on this dose until our follow up which should be about 2 weeks  >You can discontinue the antibiotic 3 times weekly once you lower the prednisone to less than 40mg  total per day.  >I am checking your blood count and liver and kidney function and muscle enzyme levels to monitor for the disease activity and medication side effects. We will call after seeing results.  >You do not have any signs of lung disease, but your autoimmune disease antibodies are often seen in people who develop inflammation in multiple sites including the muscles, lungs, and skin. I will refer you to see a pulmonologist sometime in the next month or 2 and make sure we are not missing anything.  > I recommend starting a new medication called azathioprine (Imuran) at a low dose as a medicine to treat inflammation that has less side effects than the steroids. We would start at low dose 1 tablet daily (50mg ). I will send this prescription after reviewing your lab results from today   Azathioprine tablets What is this medicine? AZATHIOPRINE (ay za THYE oh preen) suppresses the immune system. It is used to prevent organ rejection after a transplant. It is also used to treat rheumatoid arthritis. This medicine may be used for other purposes; ask your health care provider or pharmacist if you have questions. COMMON BRAND NAME(S): Azasan, Imuran What should I tell my health care provider before I take this medicine? They need to know if you have any of these conditions:  infection  kidney disease  liver disease  an unusual or allergic reaction to azathioprine, other medicines, lactose, foods, dyes, or  preservatives  pregnant or trying to get pregnant  breast feeding How should I use this medicine? Take this medicine by mouth with a full glass of water. Follow the directions on the prescription label. Take your medicine at regular intervals. Do not take your medicine more often than directed. Continue to take your medicine even if you feel better. Do not stop taking except on your doctor's advice. Talk to your pediatrician regarding the use of this medicine in children. Special care may be needed. Overdosage: If you think you have taken too much of this medicine contact a poison control center or emergency room at once. NOTE: This medicine is only for you. Do not share this medicine with others. What if I miss a dose? If you miss a dose, take it as soon as you can. If it is almost time for your next dose, take only that dose. Do not take double or extra doses. What may interact with this medicine? Do not take this medicine with any of the following medications:  febuxostat  mercaptopurine This medicine may also interact with the following medications:  allopurinol  aminosalicylates like sulfasalazine, mesalamine, balsalazide, and olsalazine  leflunomide  medicines called ACE inhibitors like benazepril, captopril, enalapril, fosinopril, quinapril, lisinopril, ramipril, and trandolapril  mycophenolate  sulfamethoxazole; trimethoprim  vaccines  warfarin This list may not describe all possible interactions. Give your health care provider a list of all the medicines, herbs, non-prescription drugs, or dietary supplements you use. Also tell them if you smoke, drink alcohol, or  use illegal drugs. Some items may interact with your medicine. What should I watch for while using this medicine? Visit your doctor or health care professional for regular checks on your progress. You will need frequent blood checks during the first few months you are receiving the medicine. If you get a cold  or other infection while receiving this medicine, call your doctor or health care professional. Do not treat yourself. The medicine may increase your risk of getting an infection. Women should inform their doctor if they wish to become pregnant or think they might be pregnant. There is a potential for serious side effects to an unborn child. Talk to your health care professional or pharmacist for more information. Men may have a reduced sperm count while they are taking this medicine. Talk to your health care professional for more information. This medicine may increase your risk of getting certain kinds of cancer. Talk to your doctor about healthy lifestyle choices, important screenings, and your risk. What side effects may I notice from receiving this medicine? Side effects that you should report to your doctor or health care professional as soon as possible:  allergic reactions like skin rash, itching or hives, swelling of the face, lips, or tongue  changes in vision  confusion  fever, chills, or any other sign of infection  loss of balance or coordination  severe stomach pain  unusual bleeding, bruising  unusually weak or tired  vomiting  yellowing of the eyes or skin Side effects that usually do not require medical attention (report to your doctor or health care professional if they continue or are bothersome):  hair loss  nausea This list may not describe all possible side effects. Call your doctor for medical advice about side effects. You may report side effects to FDA at 1-800-FDA-1088. Where should I keep my medicine? Keep out of the reach of children. Store at room temperature between 15 and 25 degrees C (59 and 77 degrees F). Protect from light. Throw away any unused medicine after the expiration date. NOTE: This sheet is a summary. It may not cover all possible information. If you have questions about this medicine, talk to your doctor, pharmacist, or health care  provider.  2021 Elsevier/Gold Standard (2013-07-10 12:00:31)

## 2020-05-27 ENCOUNTER — Encounter (HOSPITAL_COMMUNITY): Payer: Self-pay

## 2020-05-27 LAB — SURGICAL PATHOLOGY

## 2020-05-27 NOTE — Progress Notes (Signed)
Lab results show a very large decrease in the CK from 3600s to 600s combined with her improvement in strength this looks like a good response to the treatment. She can decrease the prednisone like we described in clinic visit and follow up.

## 2020-06-02 ENCOUNTER — Ambulatory Visit: Payer: BLUE CROSS/BLUE SHIELD

## 2020-06-09 ENCOUNTER — Ambulatory Visit: Payer: BLUE CROSS/BLUE SHIELD | Attending: Internal Medicine

## 2020-06-09 ENCOUNTER — Other Ambulatory Visit: Payer: Self-pay

## 2020-06-09 DIAGNOSIS — M6281 Muscle weakness (generalized): Secondary | ICD-10-CM | POA: Diagnosis not present

## 2020-06-09 DIAGNOSIS — R262 Difficulty in walking, not elsewhere classified: Secondary | ICD-10-CM | POA: Insufficient documentation

## 2020-06-09 NOTE — Therapy (Signed)
[place], [place], [address] Phone: [phone]   Fax:  [phone]  Physical Therapy Evaluation/Discharge  Patient Details  Name: Rachael Patton MRN: [id number] Date of Birth: 12/06/2000 Referring Provider (PT): Collier Salina, MD   Encounter Date: 06/09/2020   PT End of Session - 06/09/20 1435    Visit Number 1    Authorization Type BCBS    PT Start Time 6962    PT Stop Time 1523    PT Time Calculation (min) 38 min    Activity Tolerance Patient tolerated treatment well    Behavior During Therapy [facility] for tasks assessed/performed           Past Medical History:  Diagnosis Date  . Anemia   . Asthma    As a child  . Pre-diabetes   . Prediabetes   . Rhabdomyolysis   . Thyroid disease    found at age 25    Past Surgical History:  Procedure Laterality Date  . FOOT SURGERY Left 06/2019  . MUSCLE BIOPSY Right 05/14/2020   Procedure: RIGHT THIGH MUSCLE BIOPSY;  Surgeon: Dwan Bolt, MD;  Location: [facility];  Service: General;  Laterality: Right;  ROOM 3 STARTING AT 04:00PM FOR 45MIN    There were no vitals filed for this visit.    Subjective Assessment - 06/09/20 1435    Subjective "It was both thighs, but I have been on prednisone since February and it's not as bad as it was. I had Covid-19 in January and somehow, I thought I was better and realized that my legs were weak. It was hard for me to stand up, and I would get so out of breath walking up the steps or walking at the store. After being in the hospital and bringing my CK levels down and pushing fluids, it got better. Now I walk faster up the steps. As of now, I don't really have a problem doing anything. I'm actually doing more than I was doing prior to all this going on. The only problem now is my R knee is numb, but I think that's from when I had the biopsy done. I go back for a post-op follow up tomorrow."    Pertinent History See PMH  above    How long can you sit comfortably? Denies limitations    How long can you stand comfortably? Denies limitations    How long can you walk comfortably? "After a while when grocery shopping, I will be tired once I'm home but doesn't keep me from getting groceries."    Patient Stated Goals N/A - has returned to desired activities    Currently in Pain? No/denies    Pain Score 0-No pain              [facility] PT Assessment - 06/09/20 0001      Assessment   Medical Diagnosis Myositis of thigh, unspecified laterality, unspecified myositis type (X52.841)    Referring Provider (PT) Collier Salina, MD    Onset Date/Surgical Date --   January 2022   Hand Dominance Right    Next MD Visit 06/10/2020    Prior Therapy August 2021 for L foot (hindfoot arthroscopy)      Precautions   Precautions None      Restrictions   Weight Bearing Restrictions No      Balance Screen   Has the patient fallen in the past 6 months Yes    How many times?  1   was chasing after her hamster   Has the patient had a decrease in activity level because of a fear of falling?  No    Is the patient reluctant to leave their home because of a fear of falling?  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 Pt explains responses of "limited a little" are for activities she does not plan on performing (vigorous activities) and activities that she has minimal limitation with due to decreased endurance but has improved as she continues to perform daily and recreational activities.      ROM / Strength   AROM / PROM / Strength AROM;Strength      AROM   Overall AROM Comments BLE AROM WFL without pain      Strength   Overall Strength Comments BLE 4+/5 - 5/5 grossly with no pain noted    Strength Assessment Site Hip;Knee;Ankle    Right/Left Hip Right;Left    Right Hip Flexion --    Left Hip Flexion --    Right/Left Knee Right;Left    Right/Left Ankle Right;Left      High Level Balance   High Level Balance Comments Able to maintain narrow BOS EO/EC and tandem stance with each LE positioned posteriorly ~10 seconds. Difficulty with L SLS but patient reports this has always been difficult.                      Objective measurements completed on examination: See above findings.       [place] Adult PT Treatment/Exercise - 06/09/20 0001      Transfers   Five time sit to stand comments  11.55 seconds from low mat without UE support      Self-Care   Self-Care Other Self-Care Comments    Other Self-Care Comments  See patient education      Exercises   Exercises Other Exercises    Other Exercises  Demonstrated and had patient perform 5-10 reps of SLR, sidelying and standing hip ABD, sit to stands, mini squats at counter. Demonstrated isometric hip ADD and standing hip EXT.                  PT Education - 06/09/20 1553    Education Details Discussed objective findings and for patient to continue with daily activities but informed her that she may obtain new referral for PT and return for skilled PT if needed in the future. Reviewed various interventions (general HEP) that patient can incorporate in addition to daily activities to continue improving and maintaining  strength independently.    Person(s) Educated Patient  Methods Explanation    Comprehension Verbalized understanding            PT Short Term Goals - 06/09/20 1440      PT SHORT TERM GOAL #1   Title --    Baseline --    Time --    Period --    Status --    Target Date --             PT Long Term Goals - 06/09/20 1441      PT LONG TERM GOAL #1   Title --    Baseline --    Time --    Period --    Status --    Target Date --                  Plan - 06/09/20 1435    Clinical Impression Statement Patient is a 20 year old female who presents to OPPT with referral of myositis of thigh to address proximal leg weakness and gait unsteadiness. Upon assessment of strength and mobility, patient does not present with any limitations or deficits that require skilled PT at this time. She denies pain during active, passive, or resisted motions during evaluation and denies TTP along BLE. She expresses that she has returned to desired activities and presented to evaluation to have PT on an "as needed" basis. Unable to provoke concordant symptoms at this time, and patient reports only mild limitation with endurance that has progressively improved as she has returned to daily activities. Patient will be D/C as treatment is not currently indicated but informed patient that she may obtain referral for PT and return if she needs in the future.    Personal Factors and Comorbidities --    Comorbidities --    Stability/Clinical Decision Making Stable/Uncomplicated    Clinical Decision Making Low    PT Frequency 1x / week   1 session (evaluation only)   PT Treatment/Interventions ADLs/Self Care Home Management    PT Next Visit Plan Treatment not indicated at this time    PT [place] - provided options of interventions for patient to continue performing at home in addition to current activity    Consulted and Agree with Plan of Care Patient           Patient will  benefit from skilled therapeutic intervention in order to improve the following deficits and impairments:     Visit Diagnosis: Muscle weakness (generalized)  Difficulty in walking, not elsewhere classified     Problem List Patient Active Problem List   Diagnosis Date Noted  . Morbid obesity with BMI of 40.0-44.9, adult ([place]) 05/08/2020  . Myositis 05/08/2020  . High risk medication use 05/08/2020  . Volume overload 05/08/2020  . Morbid obesity ([place]) 04/30/2020  . Prolactin increased 04/30/2020  . Hypothyroidism 04/30/2020  . Prediabetes 04/30/2020  . Iron deficiency anemia 04/30/2020  . Elevated LFTs 04/30/2020  . 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  Patient goals were met. Patient is being discharged due to being pleased with the current functional level.  ?????     Patient goals not established due to skilled PT not being indicated at this time. Patient is pleased with her current level of  function and does not present with any deficits that require skilled treatment. She should continue to do well with current daily and recreational activities.   Haydee Monica, PT, DPT 06/09/20 4:02 PM  [facility] [address] [place], [place], [address] Phone: [phone]   Fax:  [phone]  Name: Rachael Patton MRN: [id number] Date of Birth: 06-04-2000

## 2020-06-22 NOTE — Progress Notes (Signed)
Office Visit Note  Patient: Rachael Patton             Date of Birth: [birth year]           MRN: [id number]             PCP: [name], PA Referring: [name], PA Visit Date: 06/23/2020   Subjective:  Follow up for myositis  History of Present Illness: Rachael Patton is a 20 y.o. 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 She feels well with no major complaints. She has some muscle cramps in legs but strength is doing well and participated in physical therapy. She has some mostly nonproductive cough but no shortness of breath. No trouble with swallowing or choking on food.  Review of Systems  Constitutional: Negative for fatigue.  HENT: Negative for mouth sores, mouth dryness and nose dryness.   Eyes: Negative for pain, itching, visual disturbance and dryness.  Respiratory: Positive for cough. Negative for hemoptysis, shortness of breath and difficulty breathing.   Cardiovascular: Negative for chest pain, palpitations and swelling in legs/feet.  Gastrointestinal: Negative for abdominal pain, blood in stool, constipation and diarrhea.  Endocrine: Negative for increased urination.  Genitourinary: Negative for painful urination.  Musculoskeletal: Positive for myalgias and myalgias. Negative for arthralgias, joint pain, joint swelling, muscle weakness, morning stiffness and muscle tenderness.       Patient complains of muscle cramping.   Skin: Negative for color change, rash and redness.  Allergic/Immunologic: Negative for susceptible to infections.  Neurological: Negative for dizziness, numbness, headaches, memory loss and weakness.  Hematological: Negative for swollen glands.  Psychiatric/Behavioral: Negative for confusion and sleep disturbance.    Previous HPI: 05/26/20 Rachael Patton is a 20 y.o. 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 She underwent  right thigh muscle biopsy since the last visit. Labs were checked showing CK is responding to treatment also G6PD is good. 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 Since the last visit she describes a good improvement in her strength and is now feeling well. There is still a bit weaker than her baseline. She has swelling a lot from the steroids and a lot of appetite increase, otherwise tolerating okay. Her oral thrush cleared up.  05/08/20 07/06/20 a 19 y.o.femalewith history of hypothyroidism, elevated prolactin here for follow up of hospitalization last week with proximal muscle weakness found to have highly elevated CK and myoglobinuria. This was treated with IV fluids and started steroid treatment with improvement in CK from 11,796 to 4,047. She was discharged on prednisone 80mg  per day dose. Prior to this she was ill with COVID a month ago. She was having some type of GI illness in December not clear if this was related to COVID infection or separate process. Workup with labs on 1/6 and 1/7 showing transaminitis also positive Ab tests for COVID19 and Hepatitis A. Since the hospital visit she feels her symptoms are partially improved. She notices continued leg swelling and has some dyspnea with lying supine and on exertion. Skin rash on the face is slightly less warm and severe than before. She thinks strength is slightly better but not at her baseline at all.  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    Past Medical History:  Diagnosis Date   Anemia    Asthma    As a child   Myositis    Pre-diabetes    Prediabetes    Rhabdomyolysis    Thyroid disease    found at age 77    Family History  Problem Relation Age of Onset   Diabetes Mother    Diabetes Maternal Grandmother    Diabetes Paternal Grandmother    Stroke Paternal Grandmother    Stroke Paternal Grandfather    Diabetes Paternal Grandfather    Multiple sclerosis Cousin    Lupus Cousin    Past Surgical History:  Procedure Laterality Date   FOOT SURGERY Left 06/2019   MUSCLE BIOPSY Right 05/14/2020   Procedure: RIGHT THIGH MUSCLE BIOPSY;  Surgeon: Fritzi Mandes, MD;  Location: [facility];  Service: General;  Laterality: Right;  ROOM 3 STARTING AT 04:00PM FOR   Social History   Social History Narrative   Graduated from [employer] in sociology/toxicology   Starting day care job soon   Lives with boyfriend   No children   Immunization History  Administered Date(s) Administered   PFIZER(Purple Top)SARS-COV-2 Vaccination 04/17/2020     Objective: Vital Signs: BP 107/73 (BP Location: Left Arm, Patient Position: Sitting, Cuff Size: Normal)    Pulse (!) 103    Ht 5\' 5"  (1.651 m)    Wt 253 lb 9.6 oz (115 kg)    BMI 42.20 kg/m    Physical Exam Constitutional:      Appearance: She is obese.  HENT:     Mouth/Throat:     Mouth: Mucous membranes are moist.     Pharynx: Oropharynx is clear.  Cardiovascular:     Rate and Rhythm: Regular rhythm. Tachycardia present.  Pulmonary:     Effort: Pulmonary effort is normal.     Breath sounds: Normal breath sounds.  Skin:    General: Skin is warm and dry.     Findings: No rash.  Neurological:     General: No focal deficit present.     Mental Status: She is alert.     Comments: Motor strength is 5/5 throughout upper and lower extremities   Psychiatric:        Mood and Affect: Mood normal.     Musculoskeletal Exam:  Shoulders full ROM no tenderness or swelling Elbows full ROM no tenderness or swelling Wrists full ROM no tenderness or swelling Fingers full ROM no tenderness or swelling Knees full ROM no tenderness or swelling Ankles full ROM no tenderness or swelling  Investigation: No additional findings.  Imaging: No results found.  Recent Labs: Lab Results  Component Value Date   WBC 18.6 (H) 06/23/2020   HGB 12.8 06/23/2020   PLT 393 06/23/2020   NA 140 06/23/2020   K 4.6 06/23/2020   CL 102 06/23/2020   CO2 27 06/23/2020   GLUCOSE 124 (H) 06/23/2020   BUN 8 06/23/2020   CREATININE 0.68 06/23/2020   BILITOT 0.4 06/23/2020   ALKPHOS 38 05/04/2020  AST 13 06/23/2020   ALT 16 06/23/2020   PROT 7.6 06/23/2020   ALBUMIN 2.5 (L) 05/04/2020   CALCIUM 9.5 06/23/2020   GFRAA 145 06/23/2020    Speciality Comments: No specialty comments available.  Procedures:  No procedures performed Allergies: Patient has no known allergies.   Assessment / Plan:     Visit Diagnoses: Myositis of thigh, unspecified laterality, unspecified myositis type - Plan: CK, predniSONE (DELTASONE) 5 MG tablet  Symptoms are doing well on prednisone tapering so far. Recommend she continue reducing this fairly quickly down from 20 mg over next month by 5 mg increments. Checking CK level today would slow tapering if elevation returns.  High risk medication use - Plan: CBC with Differential/Platelet, COMPLETE METABOLIC PANEL WITH GFR  Repeating CBC, CMP with continued steroid use with her leukocytosis, transaminases, hyperglycemia. Also checking iron studies today her muscle cramps could be associated to IDA.  Iron deficiency anemia, unspecified iron deficiency anemia type - Plan: Iron, TIBC and Ferritin Panel Muscle cramps  Checking iron levels and CMP for metabolic causes.   Orders: Orders Placed This Encounter  Procedures    CK   CBC with Differential/Platelet   COMPLETE METABOLIC PANEL WITH GFR   Iron, TIBC and Ferritin Panel   Meds ordered this encounter  Medications   predniSONE (DELTASONE) 5 MG tablet    Sig: Take 3 tablets (15 mg total) by mouth daily with breakfast for 7 days, THEN 2 tablets (10 mg total) daily with breakfast for 7 days, THEN 1 tablet (5 mg total) daily with breakfast for 14 days.    Dispense:  50 tablet    Refill:  0     Follow-Up Instructions: Return in about 2 months (around 08/23/2020).   Fuller Plan, MD  Note - This record has been created using [employer].  Chart creation errors have been sought, but may not always  have been located. Such creation errors do not reflect on  the standard of medical care.

## 2020-06-23 ENCOUNTER — Ambulatory Visit (INDEPENDENT_AMBULATORY_CARE_PROVIDER_SITE_OTHER): Payer: BLUE CROSS/BLUE SHIELD | Admitting: Internal Medicine

## 2020-06-23 ENCOUNTER — Other Ambulatory Visit: Payer: Self-pay

## 2020-06-23 ENCOUNTER — Encounter: Payer: Self-pay | Admitting: Internal Medicine

## 2020-06-23 VITALS — BP 107/73 | HR 103 | Ht 65.0 in | Wt 253.6 lb

## 2020-06-23 DIAGNOSIS — D509 Iron deficiency anemia, unspecified: Secondary | ICD-10-CM

## 2020-06-23 DIAGNOSIS — Z79899 Other long term (current) drug therapy: Secondary | ICD-10-CM

## 2020-06-23 DIAGNOSIS — M60859 Other myositis, unspecified thigh: Secondary | ICD-10-CM | POA: Diagnosis not present

## 2020-06-23 DIAGNOSIS — E039 Hypothyroidism, unspecified: Secondary | ICD-10-CM

## 2020-06-23 DIAGNOSIS — Q998 Other specified chromosome abnormalities: Secondary | ICD-10-CM | POA: Insufficient documentation

## 2020-06-23 DIAGNOSIS — R252 Cramp and spasm: Secondary | ICD-10-CM | POA: Diagnosis not present

## 2020-06-23 MED ORDER — PREDNISONE 5 MG PO TABS: ORAL_TABLET | ORAL | 0 refills | Status: AC

## 2020-06-24 LAB — COMPLETE METABOLIC PANEL WITH GFR
AG Ratio: 1.1 (calc) (ref 1.0–2.5)
ALT: 16 U/L (ref 6–29)
AST: 13 U/L (ref 10–30)
Albumin: 4 g/dL (ref 3.6–5.1)
Alkaline phosphatase (APISO): 84 U/L (ref 31–125)
BUN: 8 mg/dL (ref 7–25)
CO2: 27 mmol/L (ref 20–32)
Calcium: 9.5 mg/dL (ref 8.6–10.2)
Chloride: 102 mmol/L (ref 98–110)
Creat: 0.68 mg/dL (ref 0.50–1.10)
GFR, Est African American: 145 mL/min/{1.73_m2} (ref >=60)
GFR, Est Non African American: 125 mL/min/{1.73_m2} (ref >=60)
Globulin: 3.6 g/dL (calc) (ref 1.9–3.7)
Glucose, Bld: 124 mg/dL — ABNORMAL HIGH (ref 65–99)
Potassium: 4.6 mmol/L (ref 3.5–5.3)
Sodium: 140 mmol/L (ref 135–146)
Total Bilirubin: 0.4 mg/dL (ref 0.2–1.2)
Total Protein: 7.6 g/dL (ref 6.1–8.1)

## 2020-06-24 LAB — CBC WITH DIFFERENTIAL/PLATELET
Absolute Monocytes: 595 cells/uL (ref 200–950)
Basophils Absolute: 37 cells/uL (ref 0–200)
Basophils Relative: 0.2 %
Eosinophils Absolute: 19 cells/uL (ref 15–500)
Eosinophils Relative: 0.1 %
HCT: 39.6 % (ref 35.0–45.0)
Hemoglobin: 12.8 g/dL (ref 11.7–15.5)
Lymphs Abs: 1600 cells/uL (ref 850–3900)
MCH: 26.4 pg — ABNORMAL LOW (ref 27.0–33.0)
MCHC: 32.3 g/dL (ref 32.0–36.0)
MCV: 81.6 fL (ref 80.0–100.0)
MPV: 11.4 fL (ref 7.5–12.5)
Monocytes Relative: 3.2 %
Neutro Abs: 16349 cells/uL — ABNORMAL HIGH (ref 1500–7800)
Neutrophils Relative %: 87.9 %
Platelets: 393 10*3/uL (ref 140–400)
RBC: 4.85 10*6/uL (ref 3.80–5.10)
RDW: 15.6 % — ABNORMAL HIGH (ref 11.0–15.0)
Total Lymphocyte: 8.6 %
WBC: 18.6 10*3/uL — ABNORMAL HIGH (ref 3.8–10.8)

## 2020-06-24 LAB — CK: Total CK: 140 U/L (ref 29–143)

## 2020-06-24 LAB — IRON,TIBC AND FERRITIN PANEL
%SAT: 6 % (calc) — ABNORMAL LOW (ref 16–45)
Ferritin: 155 ng/mL — ABNORMAL HIGH (ref 16–154)
Iron: 15 ug/dL — ABNORMAL LOW (ref 40–190)
TIBC: 247 mcg/dL (calc) — ABNORMAL LOW (ref 250–450)

## 2020-06-24 NOTE — Progress Notes (Signed)
Lab results show CK level is now completely normal which matches up with her symptoms doing well. The white blood cell count is very high this is almost certainly due to the prednisone. The iron level is low, and may benefit from starting a supplement such as 325 mg iron pill daily. I recommend she proceed with tapering down the prednisone like we discussed, and sending new Rx to pharmacy with these instructions as well.

## 2020-06-25 ENCOUNTER — Encounter: Payer: Self-pay | Admitting: Pulmonary Disease

## 2020-06-25 ENCOUNTER — Ambulatory Visit (INDEPENDENT_AMBULATORY_CARE_PROVIDER_SITE_OTHER): Payer: BLUE CROSS/BLUE SHIELD | Admitting: Pulmonary Disease

## 2020-06-25 ENCOUNTER — Other Ambulatory Visit: Payer: Self-pay

## 2020-06-25 DIAGNOSIS — Q998 Other specified chromosome abnormalities: Secondary | ICD-10-CM

## 2020-06-25 DIAGNOSIS — J849 Interstitial pulmonary disease, unspecified: Secondary | ICD-10-CM | POA: Insufficient documentation

## 2020-06-25 NOTE — Assessment & Plan Note (Signed)
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 Her cough is currently unexplained.  Prednisone has been tapered to 20 mg with plan to taper this further by 5 mg/week. We will plan to obtain high-resolution CT of the chest when prednisone is down to about 5 mg. If infiltrates are noted we will proceed with PFTs.

## 2020-06-25 NOTE — Patient Instructions (Signed)
  HRCT chest around May 20 (while on 5 mg of pred) OTC delsym 66ml thrice daily as needed Ambulatory saturation

## 2020-06-25 NOTE — Progress Notes (Signed)
Subjective:    Patient ID: Rachael Patton, female    DOB: 05-20-2000, 20 y.o.   MRN: [id number]  HPI     Chief Complaint  Patient presents with  . Consult    Productive cough for 2-3 weeks with clear sputum. Referred for concern about lung disease.    20 year old never smoker referred for evaluation for ILD after recent diagnosis of inflammatory myositis  She presented with bilateral upper and lower extremity proximal muscle weakness for 3 months, hospitalized 04/2020 and was found to have elevated CK myoglobinuria.  She was treated with IV fluids and started on prednisone 80 mg/day.  CK improved from [id number].  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  Anti-Jo antibody was negative  PMH -Covid infection January 2022  She denies dyspnea on exertion or wheezing.  Muscle weakness is considerably improved.  She denies weight gain with steroids and seems to have tolerated this quite well she developed acne over her face and brown spots over her forearms. She reports cough productive of minimal white sputum which was there at onset of her illness but then resolved but now is resurfaced over the last 3 weeks.  She reports throat clearing in the daytime and worsening cough at night.  No fevers. No postnasal drip or reflux  Chest x-ray 05/01/2020 independently reviewed -no pulmonary infiltrates noted  Significant tests/ events reviewed Serology-ANA/RA negative  Past Medical History:  Diagnosis Date  . Anemia   . Asthma    As a child  . Myositis   . Pre-diabetes   . Prediabetes   . Rhabdomyolysis   . Thyroid disease    found at age 50   Past Surgical History:  Procedure Laterality Date  . FOOT SURGERY Left  06/2019  . MUSCLE BIOPSY Right 05/14/2020   Procedure: RIGHT THIGH MUSCLE BIOPSY;  Surgeon: Fritzi Mandes, MD;  Location: [facility] OR;  Service: General;  Laterality: Right;  ROOM 3 STARTING AT 04:00PM FOR    No Known Allergies  Social History   Socioeconomic History  . Marital status: Single    Spouse name: Not on file  . Number of children: Not on file  . Years of education: Not on file  . Highest education level: Not on file  Occupational History  . Not on file  Tobacco Use  . Smoking status: Never Smoker  . Smokeless tobacco: Never Used  Vaping Use  . Vaping Use: Never used  Substance and Sexual Activity  . Alcohol use: Yes    Comment: once a month  . Drug use: Never  . Sexual activity: Yes    Birth control/protection: Condom  Other Topics Concern  . Not on file  Social History Narrative   Graduated from [employer] in sociology/toxicology   Starting day care job soon   Lives with boyfriend   No children   Social Determinants of Health   Financial Resource Strain: Not on file  Food Insecurity: Not on file  Transportation Needs: Not on file  Physical Activity: Not on file  Stress: Not on file  Social Connections: Not on file  Intimate Partner Violence: Not on file    Family History  Problem Relation Age of Onset  . Diabetes  Mother   . Diabetes Maternal Grandmother   . Diabetes Paternal Grandmother   . Stroke Paternal Grandmother   . Stroke Paternal Grandfather   . Diabetes Paternal Grandfather   . Multiple sclerosis Cousin   . Lupus Cousin      Review of Systems Acne Muscle weakness is improved Brown skin spots on the back of her forearm Cough with minimal white sputum   Constitutional: negative for anorexia, fevers and sweats  Eyes: negative for irritation, redness and visual disturbance  Ears, nose, mouth, throat, and face: negative for earaches, epistaxis, nasal congestion and sore throat  Respiratory: negative for  dyspnea on exertion, sputum and  wheezing  Cardiovascular: negative for chest pain, dyspnea, lower extremity edema, orthopnea, palpitations and syncope  Gastrointestinal: negative for abdominal pain, constipation, diarrhea, melena, nausea and vomiting  Genitourinary:negative for dysuria, frequency and hematuria  Hematologic/lymphatic: negative for bleeding, easy bruising and lymphadenopathy  Musculoskeletal:negative for arthralgias, muscle weakness and stiff joints  Neurological: negative for coordination problems, gait problems, headaches and weakness  Endocrine: negative for diabetic symptoms including polydipsia, polyuria and weight loss     Objective:   Physical Exam  Gen. Pleasant, obese, in no distress, normal affect ENT - no pallor,icterus, no post nasal drip, class 2 airway Neck: No JVD, no thyromegaly, no carotid bruits Lungs: no use of accessory muscles, no dullness to percussion, decreased without rales or rhonchi  Cardiovascular: Rhythm regular, heart sounds  normal, no murmurs or gallops, no peripheral edema Abdomen: soft and non-tender, no hepatosplenomegaly, BS normal. Musculoskeletal: No deformities, no cyanosis or clubbing Neuro:  alert, non focal, no tremors       Assessment & Plan:

## 2020-06-25 NOTE — Assessment & Plan Note (Signed)
Prednisone being tapered with plan to come down to 5 mg/day.  If longer duration steroids required plan is to consider alternative such as azathioprine

## 2020-07-03 ENCOUNTER — Telehealth: Payer: Self-pay | Admitting: Internal Medicine

## 2020-07-03 NOTE — Telephone Encounter (Signed)
Patient wanted to let you know her Endocrinologist checked her CK levels yesterday, and results came back 158. Patient wanted to let you know it did go up some.

## 2020-07-14 ENCOUNTER — Telehealth: Payer: Self-pay

## 2020-07-14 NOTE — Telephone Encounter (Signed)
Pt needs to make an appointment to discuss and bring paperwork if any.

## 2020-07-14 NOTE — Telephone Encounter (Signed)
Called pt and LVM to schedule appt

## 2020-07-14 NOTE — Telephone Encounter (Signed)
Pt called stating she is interested in working part time and wanted to discuss this with [place]. Please advise.

## 2020-07-18 ENCOUNTER — Other Ambulatory Visit: Payer: Self-pay

## 2020-07-18 ENCOUNTER — Ambulatory Visit (INDEPENDENT_AMBULATORY_CARE_PROVIDER_SITE_OTHER): Payer: BLUE CROSS/BLUE SHIELD | Admitting: Physician Assistant

## 2020-07-18 ENCOUNTER — Encounter: Payer: Self-pay | Admitting: Physician Assistant

## 2020-07-18 VITALS — BP 110/70 | HR 92 | Temp 98.1°F | Ht 65.0 in | Wt 248.2 lb

## 2020-07-18 DIAGNOSIS — R5383 Other fatigue: Secondary | ICD-10-CM

## 2020-07-18 DIAGNOSIS — M60859 Other myositis, unspecified thigh: Secondary | ICD-10-CM | POA: Diagnosis not present

## 2020-07-18 DIAGNOSIS — D509 Iron deficiency anemia, unspecified: Secondary | ICD-10-CM

## 2020-07-18 DIAGNOSIS — M3320 Polymyositis, organ involvement unspecified: Secondary | ICD-10-CM | POA: Insufficient documentation

## 2020-07-18 DIAGNOSIS — E039 Hypothyroidism, unspecified: Secondary | ICD-10-CM | POA: Diagnosis not present

## 2020-07-18 MED ORDER — OMEPRAZOLE MAGNESIUM 20 MG PO TBEC: 20.0000 mg | DELAYED_RELEASE_TABLET | Freq: Every day | ORAL | 1 refills | Status: DC

## 2020-07-18 MED ORDER — IRON (FERROUS SULFATE) 325 (65 FE) MG PO TABS
ORAL_TABLET | ORAL | 1 refills | Status: DC
Start: 2020-07-18 — End: 2022-07-27

## 2020-07-18 NOTE — Patient Instructions (Signed)
It was great to see you!  1. Restart oral iron pill every other day, I have sent this in 2. Start daily acid pill to protect your stomach while on steroids, I have sent this in 3. Update blood work today  If you need your work note to say anything differently, please let us know  Goal is 64 oz water daily  Take care,  [name] PA-C

## 2020-07-18 NOTE — Progress Notes (Signed)
Rachael Patton is a 20 y.o. female is here to discuss fatigue and work schedule.  I acted as a Neurosurgeon for [employer], PA-C Corky Mull, LPN   History of Present Illness:   Chief Complaint  Patient presents with  . 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 Would like to work less number of days and hours because she is very fatigued at the end of the day. Continues to taper down on her prednisone, currently on 5 mg daily. She is seeing rheumatology and pulmonology regularly for her chronic medical issues.  Denies: concerns with sleeping, poor appetite/hydration, rectal bleeding or dark stools  She is not having any weakness or muscle pain at this time. She did have foot surgery last year and is having pain in her L foot at times. She also notes that she is having some intermittent abdominal pain and that her bowels are looser than normal. She has not taken anything for this.  She has been prescribed oral iron and took for about 3 weeks until her rx ran out. Denies issues with constipation while on this medication.  Lastly, she was recently diagnosed with hypothyroidism TSH at [facility] was 7.08 04/21/20 -- she was started on levothyroxine 50 mcg daily but never took it because she ended up getting hospitalized for her elevated CK  TSH was rechecked in the hospital and was 3.741 a month later.    Health Maintenance Due  Topic Date Due  . HPV VACCINES (1 - 2-dose series) Never done  . TETANUS/TDAP  Never done  . PAP-Cervical Cytology Screening  Never done  . PAP SMEAR-Modifier  Never done  . COVID-19 Vaccine (2 - Pfizer 3-dose series) 05/08/2020    Past Medical History:  Diagnosis Date  . Anemia   . Asthma    As a child  . Myositis   . Pre-diabetes   . Prediabetes   . Rhabdomyolysis   . Thyroid disease    found at age 69     Social History   Tobacco Use  .  Smoking status: Never Smoker  . Smokeless tobacco: Never Used  Vaping Use  . Vaping Use: Never used  Substance Use Topics  . Alcohol use: Yes    Comment: once a month  . Drug use: Never    Past Surgical History:  Procedure Laterality Date  . FOOT SURGERY Left 06/2019  . MUSCLE BIOPSY Right 05/14/2020   Procedure: RIGHT THIGH MUSCLE BIOPSY;  Surgeon: Fritzi Mandes, MD;  Location: [facility] OR;  Service: General;  Laterality: Right;  ROOM 3 STARTING AT 04:00PM FOR    Family History  Problem Relation Age of Onset  . Diabetes Mother   . Diabetes Maternal Grandmother   . Diabetes Paternal Grandmother   . Stroke Paternal Grandmother   . Stroke Paternal Grandfather   . Diabetes Paternal Grandfather   . Multiple sclerosis Cousin   . Lupus Cousin     PMHx, SurgHx, SocialHx, FamHx, Medications, and Allergies were reviewed in the Visit Navigator and updated as appropriate.   Patient Active Problem List   Diagnosis Date Noted  . 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 ILD (interstitial lung disease) (HCC) 06/25/2020  . 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 Myositis 05/08/2020  . High risk medication use 05/08/2020  . Volume overload 05/08/2020  . Morbid obesity (HCC) 04/30/2020  . Prolactin  increased 04/30/2020  . Hypothyroidism 04/30/2020  . Prediabetes 04/30/2020  . Iron deficiency anemia 04/30/2020  . Elevated LFTs 04/30/2020  . 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 Smoking status: Never Smoker  . Smokeless tobacco: Never Used  Vaping Use  . Vaping Use: Never used  Substance Use Topics  . Alcohol use: Yes    Comment: once a month  . Drug use: Never    Current Medications and Allergies:    Current Outpatient Medications:  .  guaiFENesin (ROBITUSSIN) 100 MG/5ML liquid, Take 200 mg by mouth 3 (three) times daily as needed for cough., Disp: , Rfl:  .  Iron, Ferrous Sulfate, 325 (65 Fe) MG TABS, Take one tablet every  other day, Disp: 90 tablet, Rfl: 1 .  Multiple Vitamin (MULTIVITAMIN) tablet, Take 1 tablet by mouth daily., Disp: , Rfl:  .  omeprazole (PRILOSEC OTC) 20 MG tablet, Take 1 tablet (20 mg total) by mouth daily., Disp: 30 tablet, Rfl: 1 .  predniSONE (DELTASONE) 5 MG tablet, Take 3 tablets (15 mg total) by mouth daily with breakfast for 7 days, THEN 2 tablets (10 mg total) daily with breakfast for 7 days, THEN 1 tablet (5 mg total) daily with breakfast for 14 days., Disp: 50 tablet, Rfl: 0 .  triamcinolone (KENALOG) 0.1 %, Apply to affected area 1-2 times daily (Patient taking differently: Apply 1 application topically daily.), Disp: 30 g, Rfl: 0  No Known Allergies  Review of Systems   ROS Negative unless otherwise specified per HPI.  Vitals:   Vitals:   07/18/20 1401  BP: 110/70  Pulse: 92  Temp: 98.1 F (36.7 C)  TempSrc: Temporal  SpO2: 97%  Weight: 248 lb 4 oz (112.6 kg)  Height: 5\' 5"  (1.651 m)     Body mass index is 41.31 kg/m.   Physical Exam:    Physical Exam Vitals and nursing note reviewed.  Constitutional:      General: She is not in acute distress.    Appearance: She is well-developed. She is not ill-appearing or toxic-appearing.  Cardiovascular:     Rate and Rhythm: Normal rate and regular rhythm.     Pulses: Normal pulses.     Heart sounds: Normal heart sounds, S1 normal and S2 normal.     Comments: No LE edema Pulmonary:     Effort: Pulmonary effort is normal.     Breath sounds: Normal breath sounds.  Skin:    General: Skin is warm and dry.  Neurological:     Mental Status: She is alert.     GCS: GCS eye subscore is 4. GCS verbal subscore is 5. GCS motor subscore is 6.  Psychiatric:        Speech: Speech normal.        Behavior: Behavior normal. Behavior is cooperative.      Assessment and Plan:    Rachael Patton was seen today for fatigue.  Diagnoses and all orders for this visit:  Iron deficiency anemia, unspecified iron deficiency anemia  type Recommend continued oral iron supplement intake. May take every other day. Refill for patient for another 3 months.  Hypothyroidism, unspecified type Recheck TSH and free T4. If abnormal, will have her reach out to Dr. regarding plan. -     TSH; Future -     T4, free; Future -     T4, free -     TSH  Fatigue, unspecified type No red  flags on discussion. Provided work note to recommend reduced schedule to help with her symptoms. Restart oral iron and update TSH/Free T4. Push fluids. Follow-up based on labs and clinical symptoms.  Myositis of thigh, unspecified laterality, unspecified myositis type Recent labs checked by endocrinology were reviewed, had slight increase of CK (140 to 158) and we will recheck today. If any concerns, will reach out to Dr. [name]. I have also asked her to start oral prilosec (rx sent) to help prevent worsening GI irritation while on prednisone. -     CK (Creatine Kinase); Future -     CK (Creatine Kinase)  Other orders -     Iron, Ferrous Sulfate, 325 (65 Fe) MG TABS; Take one tablet every other day -     omeprazole (PRILOSEC OTC) 20 MG tablet; Take 1 tablet (20 mg total) by mouth daily.  CMA or LPN served as scribe during this visit. History, Physical, and Plan performed by medical provider. The above documentation has been reviewed and is accurate and complete.   [name], PA-C [place] [place] 07/18/2020  Follow-up: No follow-ups on file.

## 2020-07-19 LAB — T4, FREE: Free T4: 1.3 ng/dL (ref 0.8–1.8)

## 2020-07-19 LAB — TSH: TSH: 1.09 mIU/L

## 2020-07-19 LAB — CK: Total CK: 255 U/L — ABNORMAL HIGH (ref 29–143)

## 2020-07-20 ENCOUNTER — Other Ambulatory Visit: Payer: Self-pay | Admitting: Internal Medicine

## 2020-07-20 DIAGNOSIS — M60859 Other myositis, unspecified thigh: Secondary | ICD-10-CM

## 2020-07-23 ENCOUNTER — Other Ambulatory Visit: Payer: Self-pay | Admitting: Physician Assistant

## 2020-07-23 DIAGNOSIS — M60859 Other myositis, unspecified thigh: Secondary | ICD-10-CM

## 2020-07-24 ENCOUNTER — Other Ambulatory Visit: Payer: Self-pay

## 2020-07-24 ENCOUNTER — Other Ambulatory Visit (INDEPENDENT_AMBULATORY_CARE_PROVIDER_SITE_OTHER): Payer: BLUE CROSS/BLUE SHIELD

## 2020-07-24 DIAGNOSIS — M60859 Other myositis, unspecified thigh: Secondary | ICD-10-CM

## 2020-07-24 LAB — CK: Total CK: 428 U/L — ABNORMAL HIGH (ref 7–177)

## 2020-07-25 ENCOUNTER — Telehealth: Payer: Self-pay | Admitting: Radiology

## 2020-07-25 ENCOUNTER — Encounter: Payer: Self-pay | Admitting: Physician Assistant

## 2020-07-25 NOTE — Telephone Encounter (Signed)
Patient called this morning, she had CK drawn on 07/24/2020, PCP has tried to get in touch regarding results.   CK: 428

## 2020-07-25 NOTE — Telephone Encounter (Signed)
I spoke with Ms. Vanderlinden about her increased CK that was mildly increased last week and more so again yesterday 4/28. Her only symptom complaint is fatigue and lack of energy without focal weakness or shortness of breath. I recommended she increase prednisone dose back to 10 mg per day and we will follow up at 2:40pm Wednesday 5/4 to recheck and discuss addition of steroid sparing DMARD.

## 2020-07-29 NOTE — Progress Notes (Signed)
Office Visit Note  Patient: Rachael Patton             Date of Birth: Mar 09, 2001           MRN: [id number]             PCP: [name], PA Referring: [name], PA Visit Date: 07/30/2020   Subjective:  Follow-up (Patient is here to )   History of Present Illness: Rachael Patton is a 20 y.o. female here for follow up for polymyositis she was tapering prednisone dose down but noticing increased fatigue and CK elevations back with endocrine and PCP clinic follow up. She was recommended to increase prednisone back to 10 mg daily and come for rechecking today and discussion to start steroid sparing medication. She feels somewhat fatigued otherwise without complaints. Since restarting the higher dose prednisone facial acne rashes are increased. She reports recently learning she is pregnant, at approximately [redacted] weeks gestational age.   Review of Systems  Constitutional: Positive for fatigue.  HENT: Negative for mouth sores, mouth dryness and nose dryness.   Eyes: Positive for itching. Negative for pain, visual disturbance and dryness.  Respiratory: Positive for cough. Negative for hemoptysis, shortness of breath and difficulty breathing.   Cardiovascular: Negative for chest pain, palpitations and swelling in legs/feet.  Gastrointestinal: Negative for abdominal pain, blood in stool, constipation and diarrhea.  Endocrine: Negative for increased urination.  Genitourinary: Negative for painful urination.  Musculoskeletal: Negative for arthralgias, joint pain, joint swelling, myalgias, muscle weakness, morning stiffness, muscle tenderness and myalgias.  Skin: Negative for color change, rash and redness.  Allergic/Immunologic: Negative for susceptible to infections.  Neurological: Negative for dizziness, numbness, headaches, memory loss and weakness.  Hematological: Negative for swollen glands.  Psychiatric/Behavioral: Negative for confusion and sleep disturbance.    PMFS History:   Patient Active Problem List   Diagnosis Date Noted  . 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 ILD (interstitial lung disease) (HCC) 06/25/2020  . 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 High risk medication use 05/08/2020  . Volume overload 05/08/2020  . Morbid obesity (HCC) 04/30/2020  . Prolactin increased 04/30/2020  . Hypothyroidism 04/30/2020  . Prediabetes 04/30/2020  . Iron deficiency anemia 04/30/2020  . Elevated LFTs 04/30/2020  . 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 Anemia   . Asthma    As a child  . Myositis   . Pre-diabetes   . Prediabetes   . Rhabdomyolysis   . Thyroid disease    found at age 35    Family History  Problem Relation Age of Onset  . Diabetes Mother   . Diabetes Maternal Grandmother   . Diabetes Paternal Grandmother   . Stroke Paternal Grandmother   . Stroke Paternal Grandfather   . Diabetes Paternal Grandfather   . Multiple sclerosis Cousin   . Lupus Cousin    Past Surgical History:  Procedure Laterality Date  . FOOT SURGERY Left 06/2019  . MUSCLE BIOPSY Right 05/14/2020   Procedure: RIGHT THIGH MUSCLE BIOPSY;  Surgeon: Fritzi Mandes, MD;  Location: [facility] OR;  Service: General;  Laterality: Right;  ROOM 3 STARTING AT 04:00PM FOR   Social History   Social History Narrative   Graduated from [employer] in sociology/toxicology   Starting day care job soon   Lives with boyfriend   No children   Immunization History  Administered Date(s) Administered  . PFIZER(Purple Top)SARS-COV-2 Vaccination 04/17/2020  Objective: Vital Signs: Ht 5\' 5"  (1.651 m)   Wt 247 lb 3.2 oz (112.1 kg)   LMP 06/29/2020 (Within Days)   BMI 41.14 kg/m    Physical Exam Constitutional:      Appearance: She is obese.  Eyes:     Conjunctiva/sclera: Conjunctivae normal.  Cardiovascular:     Rate and Rhythm: Normal rate and regular rhythm.  Pulmonary:     Effort: Pulmonary effort is normal.     Breath  sounds: Normal breath sounds.  Skin:    General: Skin is warm and dry.     Comments: Facial acne and milder on upper trunk Periorbital hyperpigmentation  Neurological:     General: No focal deficit present.     Mental Status: She is alert.     Deep Tendon Reflexes: Reflexes normal.  Psychiatric:        Mood and Affect: Mood normal.     Musculoskeletal Exam:  Shoulders full ROM no tenderness or swelling Elbows full ROM no tenderness or swelling Wrists full ROM no tenderness or swelling Fingers full ROM no tenderness or swelling Knees full ROM no tenderness or swelling  Investigation: No additional findings.  Imaging: No results found.  Recent Labs: Lab Results  Component Value Date   WBC 10.3 07/30/2020   HGB 12.6 07/30/2020   PLT 330 07/30/2020   NA 136 07/30/2020   K 4.1 07/30/2020   CL 104 07/30/2020   CO2 23 07/30/2020   GLUCOSE 122 (H) 07/30/2020   BUN 8 07/30/2020   CREATININE 0.58 07/30/2020   BILITOT 0.4 07/30/2020   ALKPHOS 38 05/04/2020   AST 23 07/30/2020   ALT 12 07/30/2020   PROT 7.3 07/30/2020   ALBUMIN 2.5 (L) 05/04/2020   CALCIUM 9.0 07/30/2020   GFRAA 153 07/30/2020    Speciality Comments: No specialty comments available.  Procedures:  No procedures performed Allergies: Patient has no known allergies.   Assessment / Plan:     Visit Diagnoses: Polymyositis (HCC) - Plan: CK, predniSONE (DELTASONE) 10 MG tablet  Small but progressive increases in CK suggest for some ongoing disease activity. No muscle weakness, only new rashes seem to be from the prednisone most likely. No pulmonary complaints. Checking labs today, if CK remains equal or higher will recommend continuing prednisone at 10 mg and starting azathioprine 50 mg if TPMT test is normal. If decreasing may be able to taper sooner.  High risk medication use - Plan: CBC with Differential/Platelet, COMPLETE METABOLIC PANEL WITH GFR, Thiopurine methyltransferase(tpmt)rbc  Anticipating  azathioprine treatment checking CBC, CMP, TPMT. Recently becoming pregnant, azathioprine is generally equally or better safety compared to other myositis treatment options during pregnancy but will need close lab monitoring and would wait till TPMT result before treatment start..  Orders: Orders Placed This Encounter  Procedures  . CBC with Differential/Platelet  . COMPLETE METABOLIC PANEL WITH GFR  . CK  . Thiopurine methyltransferase(tpmt)rbc   Meds ordered this encounter  Medications  . predniSONE (DELTASONE) 10 MG tablet    Sig: Take 1 tablet (10 mg total) by mouth daily with breakfast.    Dispense:  30 tablet    Refill:  1     Follow-Up Instructions: Return in about 4 weeks (around 08/27/2020) for Myositis f/u AZA start and pred taper.   10/27/2020, MD  Note - This record has been created using [employer].  Chart creation errors have been sought, but may not always  have been located. Such creation errors do  not reflect on  the standard of medical care.

## 2020-07-30 ENCOUNTER — Other Ambulatory Visit: Payer: Self-pay

## 2020-07-30 ENCOUNTER — Ambulatory Visit (INDEPENDENT_AMBULATORY_CARE_PROVIDER_SITE_OTHER): Payer: BLUE CROSS/BLUE SHIELD | Admitting: Internal Medicine

## 2020-07-30 ENCOUNTER — Encounter: Payer: Self-pay | Admitting: Internal Medicine

## 2020-07-30 VITALS — Ht 65.0 in | Wt 247.2 lb

## 2020-07-30 DIAGNOSIS — Z79899 Other long term (current) drug therapy: Secondary | ICD-10-CM

## 2020-07-30 DIAGNOSIS — M3320 Polymyositis, organ involvement unspecified: Secondary | ICD-10-CM

## 2020-07-30 NOTE — Patient Instructions (Signed)
Azathioprine tablets What is this medicine? AZATHIOPRINE (ay za THYE oh preen) suppresses the immune system. It is used to prevent organ rejection after a transplant. It is also used to treat rheumatoid arthritis. This medicine may be used for other purposes; ask your health care provider or pharmacist if you have questions. COMMON BRAND NAME(S): Azasan, Imuran What should I tell my health care provider before I take this medicine? They need to know if you have any of these conditions:  infection  kidney disease  liver disease  an unusual or allergic reaction to azathioprine, other medicines, lactose, foods, dyes, or preservatives  pregnant or trying to get pregnant  breast feeding How should I use this medicine? Take this medicine by mouth with a full glass of water. Follow the directions on the prescription label. Take your medicine at regular intervals. Do not take your medicine more often than directed. Continue to take your medicine even if you feel better. Do not stop taking except on your doctor's advice. Talk to your pediatrician regarding the use of this medicine in children. Special care may be needed. Overdosage: If you think you have taken too much of this medicine contact a poison control center or emergency room at once. NOTE: This medicine is only for you. Do not share this medicine with others. What if I miss a dose? If you miss a dose, take it as soon as you can. If it is almost time for your next dose, take only that dose. Do not take double or extra doses. What may interact with this medicine? Do not take this medicine with any of the following medications:  febuxostat  mercaptopurine This medicine may also interact with the following medications:  allopurinol  aminosalicylates like sulfasalazine, mesalamine, balsalazide, and olsalazine  leflunomide  medicines called ACE inhibitors like benazepril, captopril, enalapril, fosinopril, quinapril, lisinopril,  ramipril, and trandolapril  mycophenolate  sulfamethoxazole; trimethoprim  vaccines  warfarin This list may not describe all possible interactions. Give your health care provider a list of all the medicines, herbs, non-prescription drugs, or dietary supplements you use. Also tell them if you smoke, drink alcohol, or use illegal drugs. Some items may interact with your medicine. What should I watch for while using this medicine? Visit your doctor or health care professional for regular checks on your progress. You will need frequent blood checks during the first few months you are receiving the medicine. If you get a cold or other infection while receiving this medicine, call your doctor or health care professional. Do not treat yourself. The medicine may increase your risk of getting an infection. Women should inform their doctor if they wish to become pregnant or think they might be pregnant. There is a potential for serious side effects to an unborn child. Talk to your health care professional or pharmacist for more information. Men may have a reduced sperm count while they are taking this medicine. Talk to your health care professional for more information. This medicine may increase your risk of getting certain kinds of cancer. Talk to your doctor about healthy lifestyle choices, important screenings, and your risk. What side effects may I notice from receiving this medicine? Side effects that you should report to your doctor or health care professional as soon as possible:  allergic reactions like skin rash, itching or hives, swelling of the face, lips, or tongue  changes in vision  confusion  fever, chills, or any other sign of infection  loss of balance or coordination  severe stomach pain  unusual bleeding, bruising  unusually weak or tired  vomiting  yellowing of the eyes or skin Side effects that usually do not require medical attention (report to your doctor or health  care professional if they continue or are bothersome):  hair loss  nausea This list may not describe all possible side effects. Call your doctor for medical advice about side effects. You may report side effects to FDA at 1-800-FDA-1088. Where should I keep my medicine? Keep out of the reach of children. Store at room temperature between 15 and 25 degrees C (59 and 77 degrees F). Protect from light. Throw away any unused medicine after the expiration date.

## 2020-07-31 MED ORDER — PREDNISONE 10 MG PO TABS: 10.0000 mg | ORAL_TABLET | Freq: Every day | ORAL | 1 refills | Status: DC

## 2020-07-31 NOTE — Progress Notes (Signed)
Lab shows CK level is still the same or slightly increased. I recommend staying at the 10 mg daily dose of prednisone at this time. Blood test about the azathioprine enzyme takes a while longer to result so waiting for that. New Rx is sent to pharmacy.

## 2020-08-11 ENCOUNTER — Inpatient Hospital Stay: Admission: RE | Admit: 2020-08-11 | Payer: BLUE CROSS/BLUE SHIELD | Source: Ambulatory Visit

## 2020-08-11 LAB — CBC WITH DIFFERENTIAL/PLATELET
Absolute Monocytes: 1226 cells/uL — ABNORMAL HIGH (ref 200–950)
Basophils Absolute: 62 cells/uL (ref 0–200)
Basophils Relative: 0.6 %
Eosinophils Absolute: 165 cells/uL (ref 15–500)
Eosinophils Relative: 1.6 %
HCT: 39.3 % (ref 35.0–45.0)
Hemoglobin: 12.6 g/dL (ref 11.7–15.5)
Lymphs Abs: 1576 cells/uL (ref 850–3900)
MCH: 26 pg — ABNORMAL LOW (ref 27.0–33.0)
MCHC: 32.1 g/dL (ref 32.0–36.0)
MCV: 81 fL (ref 80.0–100.0)
MPV: 11.2 fL (ref 7.5–12.5)
Monocytes Relative: 11.9 %
Neutro Abs: 7272 cells/uL (ref 1500–7800)
Neutrophils Relative %: 70.6 %
Platelets: 330 10*3/uL (ref 140–400)
RBC: 4.85 10*6/uL (ref 3.80–5.10)
RDW: 14.7 % (ref 11.0–15.0)
Total Lymphocyte: 15.3 %
WBC: 10.3 10*3/uL (ref 3.8–10.8)

## 2020-08-11 LAB — COMPLETE METABOLIC PANEL WITH GFR
AG Ratio: 1.1 (calc) (ref 1.0–2.5)
ALT: 12 U/L (ref 6–29)
AST: 23 U/L (ref 10–30)
Albumin: 3.8 g/dL (ref 3.6–5.1)
Alkaline phosphatase (APISO): 67 U/L (ref 31–125)
BUN: 8 mg/dL (ref 7–25)
CO2: 23 mmol/L (ref 20–32)
Calcium: 9 mg/dL (ref 8.6–10.2)
Chloride: 104 mmol/L (ref 98–110)
Creat: 0.58 mg/dL (ref 0.50–1.10)
GFR, Est African American: 153 mL/min/{1.73_m2} (ref >=60)
GFR, Est Non African American: 132 mL/min/{1.73_m2} (ref >=60)
Globulin: 3.5 g/dL (calc) (ref 1.9–3.7)
Glucose, Bld: 122 mg/dL — ABNORMAL HIGH (ref 65–99)
Potassium: 4.1 mmol/L (ref 3.5–5.3)
Sodium: 136 mmol/L (ref 135–146)
Total Bilirubin: 0.4 mg/dL (ref 0.2–1.2)
Total Protein: 7.3 g/dL (ref 6.1–8.1)

## 2020-08-11 LAB — CK: Total CK: 470 U/L — ABNORMAL HIGH (ref 29–143)

## 2020-08-11 LAB — THIOPURINE METHYLTRANSFERASE (TPMT), RBC: Thiopurine Methyltransferase, RBC: 15 nmol/hr/mL RBC

## 2020-08-12 ENCOUNTER — Telehealth: Payer: Self-pay | Admitting: Pulmonary Disease

## 2020-08-12 NOTE — Telephone Encounter (Signed)
Pt is scheduled for a f/u with Dr. Vassie Loll 5/19 so all this can be further discussed at that visit if anything needs to be changed/ordered for further testing.

## 2020-08-12 NOTE — Telephone Encounter (Signed)
We will cancel HRCT since she is [redacted] weeks pregnant. CC to Dr. [name] , if medications need to be adjusted

## 2020-08-12 NOTE — Telephone Encounter (Signed)
-----   Message from Fabiola Backer, [place] sent at 08/11/2020  8:03 AM EDT ----- Regarding: CT Canceled Hey Dr. Vassie Loll,  This patient had a scan scheduled for a CT Chest High Resolution this morning, 08/11/20 @ 8:45 am. She called me and spoke with me at 8:02 am and said that she is [redacted] weeks pregnant and forgot to tell your office. We canceled the scan because of this. How would you like to proceed? We told her we would have someone from the office give her a call if there is a change of test.  Thank you! Zenaida Niece [place]

## 2020-08-14 ENCOUNTER — Ambulatory Visit: Payer: BLUE CROSS/BLUE SHIELD | Admitting: Pulmonary Disease

## 2020-08-18 ENCOUNTER — Ambulatory Visit: Payer: BLUE CROSS/BLUE SHIELD | Admitting: Internal Medicine

## 2020-08-27 LAB — OB RESULTS CONSOLE HEPATITIS B SURFACE ANTIGEN: Hepatitis B Surface Ag: NEGATIVE

## 2020-08-27 LAB — HEPATITIS C ANTIBODY: HCV Ab: NEGATIVE

## 2020-08-27 LAB — OB RESULTS CONSOLE RUBELLA ANTIBODY, IGM: Rubella: IMMUNE

## 2020-09-01 ENCOUNTER — Encounter: Payer: Self-pay | Admitting: Internal Medicine

## 2020-09-01 ENCOUNTER — Ambulatory Visit (INDEPENDENT_AMBULATORY_CARE_PROVIDER_SITE_OTHER): Payer: BLUE CROSS/BLUE SHIELD | Admitting: Internal Medicine

## 2020-09-01 ENCOUNTER — Other Ambulatory Visit: Payer: Self-pay

## 2020-09-01 VITALS — BP 108/72 | HR 82 | Ht 65.0 in | Wt 256.0 lb

## 2020-09-01 DIAGNOSIS — Z79899 Other long term (current) drug therapy: Secondary | ICD-10-CM | POA: Diagnosis not present

## 2020-09-01 DIAGNOSIS — M3320 Polymyositis, organ involvement unspecified: Secondary | ICD-10-CM

## 2020-09-01 MED ORDER — PREDNISONE 10 MG PO TABS
10.0000 mg | ORAL_TABLET | Freq: Every day | ORAL | 2 refills | Status: DC
Start: 2020-09-01 — End: 2020-12-03

## 2020-09-01 MED ORDER — AZATHIOPRINE 50 MG PO TABS: 50.0000 mg | ORAL_TABLET | Freq: Every day | ORAL | 0 refills | Status: DC

## 2020-09-01 NOTE — Progress Notes (Signed)
Office Visit Note  Patient: Rachael Patton             Date of Birth: 2001/01/14           MRN: [id number]             PCP: [name], PA Referring: [name], PA Visit Date: 09/01/2020   Subjective:  Follow-up (Patient feels as if symptoms are currently well controlled. Patient has been taking Prednisone 10 mg daily and took her last dose today. )   History of Present Illness: Rachael Patton is a 20 y.o. 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 She was seen last visit with CK elevation noted again with prednisone taper and labs were obtained regarding additional DMARD treatment. She feels about the same as before still has fatigue but no swelling, no rashes, no weakness or myalgias. At last visit TPMT phenotype was checked with normal enzyme function.     Review of Systems  Constitutional:  Positive for fatigue.  HENT:  Negative for mouth sores, mouth dryness and nose dryness.   Eyes:  Positive for itching. Negative for pain, visual disturbance and dryness.  Respiratory:  Positive for shortness of breath and difficulty breathing. Negative for cough and hemoptysis.   Cardiovascular:  Negative for chest pain, palpitations and swelling in legs/feet.  Gastrointestinal:  Negative for abdominal pain, blood in stool, constipation and diarrhea.  Endocrine: Negative for increased urination.  Genitourinary:  Negative for painful urination.  Musculoskeletal:  Negative for joint pain, joint pain, joint swelling, myalgias, muscle weakness, morning stiffness, muscle tenderness and myalgias.  Skin:  Negative for color change, rash and redness.  Allergic/Immunologic: Negative for susceptible to infections.  Neurological:  Negative for dizziness, numbness, headaches, memory loss and weakness.  Hematological:  Negative for swollen glands.  Psychiatric/Behavioral:  Negative for confusion and sleep disturbance.    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    Past Medical History:  Diagnosis Date   Anemia    Asthma    As a child   Myositis    Pre-diabetes    Prediabetes    Rhabdomyolysis    Thyroid disease    found at age 30    Family History  Problem Relation Age of Onset   Diabetes Mother    Diabetes Maternal Grandmother    Diabetes Paternal Grandmother    Stroke Paternal Grandmother    Stroke Paternal Grandfather    Diabetes Paternal Grandfather    Multiple sclerosis Cousin    Lupus Cousin    Past Surgical History:  Procedure Laterality Date   FOOT SURGERY Left 06/2019   MUSCLE BIOPSY Right 05/14/2020   Procedure: RIGHT THIGH MUSCLE BIOPSY;  Surgeon: Fritzi Mandes, MD;  Location: [facility];  Service: General;  Laterality: Right;  ROOM 3 STARTING AT 04:00PM FOR   Social History   Social History Narrative   Graduated from [employer] in sociology/toxicology   Starting day care job soon   Lives with boyfriend   No children  Immunization History  Administered Date(s) Administered   PFIZER(Purple Top)SARS-COV-2 Vaccination 04/17/2020     Objective: Vital Signs: BP 108/72 (BP Location: Left Arm, Patient Position: Sitting, Cuff Size: Large)   Pulse 82   Ht 5\' 5"  (1.651 m)   Wt 256 lb (116.1 kg)   LMP 07/03/2020 (LMP Unknown)   BMI 42.60 kg/m    Physical Exam Constitutional:      Appearance: She is obese.  Pulmonary:     Effort: Pulmonary effort is normal.     Breath sounds: Normal breath sounds.  Skin:    General: Skin is warm and dry.     Findings: No  rash.  Neurological:     Mental Status: She is alert.     Comments: Strength 5/5 throughout  Psychiatric:        Mood and Affect: Mood normal.     Musculoskeletal Exam:  Elbows full ROM no tenderness or swelling Wrists full ROM no tenderness or swelling Fingers full ROM no tenderness or swelling Knees full ROM no tenderness or swelling   Investigation: No additional findings.  Imaging: No results found.  Recent Labs: Lab Results  Component Value Date   WBC 10.3 07/30/2020   HGB 12.6 07/30/2020   PLT 330 07/30/2020   NA 136 07/30/2020   K 4.1 07/30/2020   CL 104 07/30/2020   CO2 23 07/30/2020   GLUCOSE 122 (H) 07/30/2020   BUN 8 07/30/2020   CREATININE 0.58 07/30/2020   BILITOT 0.4 07/30/2020   ALKPHOS 38 05/04/2020   AST 23 07/30/2020   ALT 12 07/30/2020   PROT 7.3 07/30/2020   ALBUMIN 2.5 (L) 05/04/2020   CALCIUM 9.0 07/30/2020   GFRAA 153 07/30/2020    Speciality Comments: No specialty comments available.  Procedures:  No procedures performed Allergies: Patient has no known allergies.   Assessment / Plan:     Visit Diagnoses: Polymyositis (HCC) - Plan: CK, predniSONE (DELTASONE) 10 MG tablet, azaTHIOprine (IMURAN) 50 MG tablet  Checking CK again for disease response to increasing steroids at 10 mg dose.  Plan to start Imuran and low-dose 50 mg p.o. daily recommend repeat lab testing after 4 weeks follow-up with plan for dose titration if tolerating.  High risk medication use - Plan: CBC with Differential/Platelet, COMPLETE METABOLIC PANEL WITH GFR  Azathioprine can be associated with cytopenia or hepatorenal toxicity we will check CBC and CMP for monitoring today.  Discussed risks and benefits of treatment in the context of early pregnancy Imuran is generally well-tolerated recommend lower risk of complications as compared to high-dose glucocorticoids for uncontrolled autoimmune disease.    Orders: Orders Placed This Encounter  Procedures   CBC with  Differential/Platelet   COMPLETE METABOLIC PANEL WITH GFR   CK   Meds ordered this encounter  Medications   predniSONE (DELTASONE) 10 MG tablet    Sig: Take 1 tablet (10 mg total) by mouth daily with breakfast.    Dispense:  30 tablet    Refill:  2   azaTHIOprine (IMURAN) 50 MG tablet    Sig: Take 1 tablet (50 mg total) by mouth daily.    Dispense:  45 tablet    Refill:  0     Follow-Up Instructions: Return in about 3 months (around 12/02/2020) for Myositis on prednisone and azathioprine f/u.   02/01/2021, MD  Note - This record has been created using [facility] Plan.  Chart creation errors have been sought, but may not always  have been located.  Such creation errors do not reflect on  the standard of medical care.

## 2020-09-11 ENCOUNTER — Other Ambulatory Visit: Payer: Self-pay | Admitting: Obstetrics and Gynecology

## 2020-09-11 DIAGNOSIS — O0991 Supervision of high risk pregnancy, unspecified, first trimester: Secondary | ICD-10-CM

## 2020-10-09 ENCOUNTER — Other Ambulatory Visit: Payer: Self-pay | Admitting: Internal Medicine

## 2020-10-09 DIAGNOSIS — M3320 Polymyositis, organ involvement unspecified: Secondary | ICD-10-CM

## 2020-10-09 NOTE — Telephone Encounter (Signed)
Next Visit: 12/02/2020 Last Visit: 09/01/2020  Last Fill: 09/01/2020  DX: Polymyositis  Current Dose per office note 09/01/2020: azaTHIOprine (IMURAN) 50 MG tablet  Labs: CMP, CBC- 09/01/2020  Okay to refill Imuran, 90-day supply?

## 2020-10-13 ENCOUNTER — Telehealth: Payer: Self-pay | Admitting: *Deleted

## 2020-10-13 NOTE — Telephone Encounter (Signed)
-----   Message from Oretha Milch, MD sent at 10/13/2020 11:18 AM EDT ----- Regarding: FW: CT Canceled Please arrange for FU with TP / me  Also pl ensure she has follow up with rheum to discuss meds during pregnancy  RA ----- Message ----- From: Fabiola Backer, RT Sent: 08/11/2020   8:07 AM EDT To: Oretha Milch, MD Subject: CT Canceled                                    Hey Dr. Vassie Loll,  This patient had a scan scheduled for a CT Chest High Resolution this morning, 08/11/20 @ 8:45 am. She called me and spoke with me at 8:02 am and said that she is [redacted] weeks pregnant and forgot to tell your office. We canceled the scan because of this. How would you like to proceed? We told her we would have someone from the office give her a call if there is a change of test.  Thank you! Zenaida Niece [place]

## 2020-10-13 NOTE — Telephone Encounter (Signed)
Rachael Milch, MD  Rachael Patton, CMA Please arrange for FU with TP / me  Also pl ensure she has follow up with rheum to discuss meds during pregnancy   RA         Previous Messages    ----- Message -----  From: Fabiola Backer, RT  Sent: 08/11/2020   8:07 AM EDT  To: Rachael Milch, MD  Subject: CT Canceled                                     Hey Dr. Vassie Loll,   This patient had a scan scheduled for a CT Chest High Resolution this morning, 08/11/20 @ 8:45 am. She called me and spoke with me at 8:02 am and said that she is [redacted] weeks pregnant and forgot to tell your office. We canceled the scan because of this. How would you like to proceed? We told her we would have someone from the office give her a call if there is a change of test.   Thank you!  Zenaida Niece  [place] [facility]    I called and spoke with the pt and notified of recommendations from Dr Vassie Loll. Pt verbalized understanding. Appt scheduled with Dr Vassie Loll for 11/19/20 at 1:45 pm.She staets that she has already been seen by rheum and meds changed.

## 2020-10-21 ENCOUNTER — Ambulatory Visit (INDEPENDENT_AMBULATORY_CARE_PROVIDER_SITE_OTHER): Payer: BLUE CROSS/BLUE SHIELD | Admitting: Obstetrics & Gynecology

## 2020-10-21 ENCOUNTER — Encounter: Payer: Self-pay | Admitting: Obstetrics & Gynecology

## 2020-10-21 ENCOUNTER — Other Ambulatory Visit: Payer: Self-pay

## 2020-10-21 VITALS — BP 106/71 | HR 96 | Wt 259.0 lb

## 2020-10-21 DIAGNOSIS — J849 Interstitial pulmonary disease, unspecified: Secondary | ICD-10-CM

## 2020-10-21 DIAGNOSIS — Z3480 Encounter for supervision of other normal pregnancy, unspecified trimester: Secondary | ICD-10-CM | POA: Diagnosis not present

## 2020-10-21 DIAGNOSIS — Z3A16 16 weeks gestation of pregnancy: Secondary | ICD-10-CM | POA: Diagnosis not present

## 2020-10-21 DIAGNOSIS — M3320 Polymyositis, organ involvement unspecified: Secondary | ICD-10-CM

## 2020-10-21 DIAGNOSIS — E039 Hypothyroidism, unspecified: Secondary | ICD-10-CM

## 2020-10-21 DIAGNOSIS — R7303 Prediabetes: Secondary | ICD-10-CM

## 2020-10-21 DIAGNOSIS — O0990 Supervision of high risk pregnancy, unspecified, unspecified trimester: Secondary | ICD-10-CM

## 2020-10-21 NOTE — Progress Notes (Signed)
Subjective:transfer from [facility] for insurance issue    Rachael Patton is a G1P0 [redacted]w[redacted]d being seen today for her first obstetrical visit.  Her obstetrical history is significant for  autoimmune disorder . Patient does intend to breast feed. Pregnancy history fully reviewed.  Patient reports no complaints.  Vitals:   10/21/20 1403  BP: 106/71  Pulse: 96  Weight: 259 lb (117.5 kg)    HISTORY: OB History  Gravida Para Term Preterm AB Living  1            SAB IAB Ectopic Multiple Live Births               # Outcome Date GA Lbr Len/2nd Weight Sex Delivery Anes PTL Lv  1 Current            Past Medical History:  Diagnosis Date   Anemia    Asthma    As a child   Myositis    Pre-diabetes    Prediabetes    Rhabdomyolysis    Thyroid disease    found at age 20   Past Surgical History:  Procedure Laterality Date   FOOT SURGERY Left 06/2019   MUSCLE BIOPSY Right 05/14/2020   Procedure: RIGHT THIGH MUSCLE BIOPSY;  Surgeon: Fritzi Mandes, MD;  Location: [facility] OR;  Service: General;  Laterality: Right;  ROOM 3 STARTING AT 04:00PM FOR   Family History  Problem Relation Age of Onset   Diabetes Mother    Diabetes Maternal Grandmother    Diabetes Paternal Grandmother    Stroke Paternal Grandmother    Stroke Paternal Grandfather    Diabetes Paternal Grandfather    Multiple sclerosis Cousin    Lupus Cousin      Exam    Uterus:     Pelvic Exam: Deferred, had exam at [facility], records in [facility]                              System: Breast:  normal appearance, no masses or tenderness, deferred exam today   Skin: normal coloration and turgor, no rashes    Neurologic: oriented, grossly non-focal, normal mood   Extremities: normal strength, tone, and muscle mass   HEENT PERRLA       Neck supple   Cardiovascular: regular rate and rhythm   Respiratory:  appears well, vitals normal, no respiratory distress, acyanotic, normal RR   Abdomen: obese           Assessment:    Pregnancy: G1P0 Patient Active Problem List   Diagnosis Date Noted   Supervision of high risk pregnancy, antepartum 10/21/2020   Polymyositis (HCC) 07/18/2020   ILD (interstitial lung disease) (HCC) 06/25/2020   Myositis associated antibody positive 06/23/2020   High risk medication use 05/08/2020   Volume overload 05/08/2020   Morbid obesity (HCC) 04/30/2020   Prolactin increased 04/30/2020   Hypothyroidism 04/30/2020   Prediabetes 04/30/2020   Iron deficiency anemia 04/30/2020   Elevated LFTs 04/30/2020   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 Problem list reviewed and updated. Genetic Screening discussed  AFP .  Ultrasound discussed; fetal survey: ordered. MFM consult is scheduled  Follow up in 4 weeks. 50% of 30 min visit spent on counseling and coordination of care.  Orders Placed This Encounter  Procedures   TSH   Hemoglobin A1c  AFP, Serum, Open Spina Bifida    Order Specific Question:   Is patient insulin dependent?    Answer:   No    Order Specific Question:   Gestational Age (GA), weeks    Answer:   16.2    Order Specific Question:   Date on which patient was at this GA    Answer:   10/21/2020    Order Specific Question:   GA Calculation Method    Answer:   LMP    Order Specific Question:   Number of fetuses    Answer:   1      Scheryl Darter 10/21/2020

## 2020-10-21 NOTE — Progress Notes (Signed)
Pt presents today for initial OB visit as transfer. Pt has no complaints today.

## 2020-10-29 LAB — AFP, SERUM, OPEN SPINA BIFIDA
AFP MoM: 1.36
AFP Value: 38.8 ng/mL
Gest. Age on Collection Date: 16.2 weeks
Maternal Age At EDD: 22.6 yr
OSBR Risk 1 IN: 8068
Test Results:: NEGATIVE
Weight: 259 [lb_av]

## 2020-10-29 LAB — HEMOGLOBIN A1C
Est. average glucose Bld gHb Est-mCnc: 131 mg/dL
Hgb A1c MFr Bld: 6.2 % — ABNORMAL HIGH (ref 4.8–5.6)

## 2020-10-29 LAB — TSH: TSH: 1.46 u[IU]/mL (ref 0.450–4.500)

## 2020-11-10 ENCOUNTER — Other Ambulatory Visit: Payer: Self-pay

## 2020-11-10 ENCOUNTER — Ambulatory Visit: Payer: BLUE CROSS/BLUE SHIELD

## 2020-11-10 ENCOUNTER — Other Ambulatory Visit: Payer: Self-pay | Admitting: *Deleted

## 2020-11-10 ENCOUNTER — Ambulatory Visit (HOSPITAL_BASED_OUTPATIENT_CLINIC_OR_DEPARTMENT_OTHER): Payer: BLUE CROSS/BLUE SHIELD | Admitting: Maternal & Fetal Medicine

## 2020-11-10 ENCOUNTER — Ambulatory Visit: Payer: BLUE CROSS/BLUE SHIELD | Admitting: *Deleted

## 2020-11-10 ENCOUNTER — Ambulatory Visit: Payer: BLUE CROSS/BLUE SHIELD | Attending: Obstetrics and Gynecology

## 2020-11-10 VITALS — BP 139/77 | HR 120

## 2020-11-10 DIAGNOSIS — O99212 Obesity complicating pregnancy, second trimester: Secondary | ICD-10-CM

## 2020-11-10 DIAGNOSIS — Z363 Encounter for antenatal screening for malformations: Secondary | ICD-10-CM

## 2020-11-10 DIAGNOSIS — M609 Myositis, unspecified: Secondary | ICD-10-CM | POA: Diagnosis not present

## 2020-11-10 DIAGNOSIS — R7303 Prediabetes: Secondary | ICD-10-CM | POA: Insufficient documentation

## 2020-11-10 DIAGNOSIS — Z6841 Body Mass Index (BMI) 40.0 and over, adult: Secondary | ICD-10-CM | POA: Insufficient documentation

## 2020-11-10 DIAGNOSIS — Z3A19 19 weeks gestation of pregnancy: Secondary | ICD-10-CM

## 2020-11-10 DIAGNOSIS — O0991 Supervision of high risk pregnancy, unspecified, first trimester: Secondary | ICD-10-CM | POA: Insufficient documentation

## 2020-11-10 DIAGNOSIS — O99891 Other specified diseases and conditions complicating pregnancy: Secondary | ICD-10-CM

## 2020-11-10 DIAGNOSIS — M3320 Polymyositis, organ involvement unspecified: Secondary | ICD-10-CM | POA: Diagnosis not present

## 2020-11-11 LAB — SJOGREN'S SYNDROME ANTIBODS(SSA + SSB)
ENA SSA (RO) Ab: 5.7 AI — ABNORMAL HIGH (ref 0.0–0.9)
ENA SSB (LA) Ab: <0.2 AI (ref 0.0–0.9)

## 2020-11-11 NOTE — Progress Notes (Signed)
MFM Consultation    Rachael Patton is a 20 yo G1P0 at 62 w 1 d who is here with an EDD of 04/05/21. She is seen at the request of Dr. Scheryl Darter  She is overall doing well today. She denies signs or symptoms of preterm labor or hypertension.  She had prenatal labs of a neg AFP. HbA1c at 6.2 neg horizon. Normal TSH.  Pregnancy related issues:  1) Polymyositis with possible interstitial lung disease Rachael Patton reports that she presented January with new onse fatigue, diarrhea, sweats and non-productive cough. She had mildly elevated LFT's at that time, positive COVD test. She was sent to the ED and had an evaluation confirming the above notes. In addition, she presented with proximal upper and lower weakness with CK values at 11K. She was suspected to have polymyositis and was placed on steroids and IV fluids. She had normal renal function at that time. She underwent a steroid taper beginning at 80 mg and on a maintenance of 10 mg. She was started on Azathioprine with the goal of weaning her off of the predinsone. She has since been under the care of a rheumatologist Dr. [name] and a pulmonologist Dr. Vassie Loll.  A muscle biopsy was performed on the right thigh. 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 She was Anti-Jo antibody negative.  Rachael Patton denies difficulty breathing or additional lung disease.   Lastly, she discovered pregnancy at 4 weeks pregnancy.  2) BMI 41  Rachael Patton is aware of total weight gain of 11-20 lbs.   3) Prediabetes of Hgb A1 6.20- she is taking her metformin, but and reports good blood sugars but is not checking them 4 times per day.  Her medical history is significant for BMI >40, elevated LFT's, preegestational diabetes, and polymyositis.   Vitals with BMI 11/10/2020 10/21/2020 09/01/2020  Height - - 5\' 5"   Weight - 259 lbs 256 lbs  BMI - - 42.6  Systolic 139 106  Diastolic 77 71 72   Pulse 120 96 82   CBC Latest Ref Rng & Units 07/30/2020 06/23/2020 05/26/2020  WBC 3.8 - 10.8 Thousand/uL 10.3 18.6(H) 26.2(H)  Hemoglobin 11.7 - 15.5 g/dL 05/28/2020 51.0 25.8  Hematocrit 35.0 - 45.0 % 39.3 39.6 38.8  Platelets 140 - 400 Thousand/uL 330 393 408(H)   CMP Latest Ref Rng & Units 07/30/2020 06/23/2020 05/26/2020  Glucose 65 - 99 mg/dL 05/28/2020) 782(U) 235(T)  BUN 7 - 25 mg/dL 8 8 14   Creatinine 0.50 - 1.10 mg/dL 614(E 3.15  Sodium 135 - 146 mmol/L 136 140 138  Potassium 3.5 - 5.3 mmol/L 4.1 4.6 4.9  Chloride 98 - 110 mmol/L 104 102 101  CO2 20 - 32 mmol/L 23 27 30   Calcium 8.6 - 10.2 mg/dL 9.0 9.5 9.2  Total Protein 6.1 - 8.1 g/dL 7.3 7.6 6.8  Total Bilirubin 0.2 - 1.2 mg/dL 0.4 0.4 0.4  Alkaline Phos 38 - 126 U/L - - -  AST 10 - 30 U/L 23 13 33(H)  ALT 6 - 29 U/L 12 16 50(H)   Past Medical History:  Diagnosis Date   Anemia    Asthma    As a child   Myositis    Pre-diabetes    Prediabetes    Rhabdomyolysis    Thyroid disease    found at age 20   Past Surgical History:  Procedure Laterality Date   FOOT SURGERY Left  06/2019   MUSCLE BIOPSY Right 05/14/2020   Procedure: RIGHT THIGH MUSCLE BIOPSY;  Surgeon: Fritzi Mandes, MD;  Location: [facility] OR;  Service: General;  Laterality: Right;  ROOM 3 STARTING AT 04:00PM FOR   Social History   Socioeconomic History   Marital status: Single    Spouse name: Not on file   Number of children: Not on file   Years of education: Not on file   Highest education level: Not on file  Occupational History   Not on file  Tobacco Use   Smoking status: Never   Smokeless tobacco: Never  Vaping Use   Vaping Use: Never used  Substance and Sexual Activity   Alcohol use: Not Currently   Drug use: Never   Sexual activity: Yes    Birth control/protection: Condom  Other Topics Concern   Not on file  Social History Narrative   Graduated from [employer] in sociology/toxicology   Starting day care job soon   Lives with boyfriend   No  children   Social Determinants of Health   Financial Resource Strain: Not on file  Food Insecurity: Not on file  Transportation Needs: Not on file  Physical Activity: Not on file  Stress: Not on file  Social Connections: Not on file  Intimate Partner Violence: Not on file   Family History  Problem Relation Age of Onset   Diabetes Mother    Diabetes Maternal Grandmother    Diabetes Paternal Grandmother    Stroke Paternal Grandmother    Stroke Paternal Grandfather    Diabetes Paternal Grandfather    Multiple sclerosis Cousin    Lupus Cousin    Outpatient Encounter Medications as of 11/10/2020  Medication Sig   azaTHIOprine (IMURAN) 50 MG tablet TAKE 1 TABLET BY MOUTH EVERY DAY   Iron, Ferrous Sulfate, 325 (65 Fe) MG TABS Take one tablet every other day   metFORMIN (GLUCOPHAGE) 500 MG tablet Take by mouth 2 (two) times daily with a meal.   predniSONE (DELTASONE) 10 MG tablet Take 1 tablet (10 mg total) by mouth daily with breakfast.   Prenatal MV-Min-Fe Fum-FA-DHA (PRENATAL+DHA PO) Take 1 capsule by mouth daily.   No facility-administered encounter medications on file as of 11/10/2020.   No Known Allergies    Imaging:  Single intrauterine pregnancy here for a detailed anatomy due polymyositis on predinsone and azathioprine and an elevated BMI of 41 Normal anatomy with measurements consistent with dates There is good fetal movement and amniotic fluid volume  In addition we discussed starting daily low dose ASA for the prevention of preeclampsia.   Counseling:  I discussed with Rachael Patton the normal nature of today's ultrasound as stated above.  1) Polymyositis: I discussed with Rachael Patton that in general the patients with polymyositis overall have good outcomes. Exacerbations are managed primarily with prednisone and azathioprine. Rachael Patton was started on the medications early in pregnancy. I reviwed the complications of the predisone include an association with fetal growth  restriction. There are some increased risk for oral clefting in animal studies. Azathioprine related exposure include preterm delivery, fetal growth restriction and Atrial and ventricular septal defects. These findings were included in a case report and therefore uncertain if directly related to azathioprine directly. However, given this we recommend serial growth exams.   Secondly, I encouraged her to wean off of prednisone given her suspected pregestational diabetes, is possible. Her dose was previously decreased to 5 mg daily but her symptoms worsened and she was increased  to 10 mg per day. If azathioprine can manage her symptoms that would be preferable.   Lastly, we discussed the increased risk for adrenal crisis in women who have been on 20mg  of prednisone or more throughout the pregnancy, requiring stress dose steroids. Given that she is on 10 mg or less she will likely not need stress dose steroids at the time of delivery.  2) elevated BMI: We discussed the increased risk for gestational diabetes, preeclampsia, fetal macrosomia, cesarean delivery and thrombosis in pregnancy. We discussed overall weight gain goal of 11-20 lbs.  3) Prediabetes:  I discussed with Rachael Patton that her hgbA1c of 6.2 places her at risk for possible diabetes. We would recommend either FBS and 2hr PP glucose checks or oral GTT for GDM.   Recommendations: 1) Lowest effective dose of prednisone, if it she can't be weaned off. 2) Serial growth exams every 4 weeks 3) Initiate weekly testing at 36 weeks give elevated BMI. 4) Delivery by 39-40 weeks. 5) Consider low dose ASA.  I spent 60 minutes with > 50% in face to face consultation. Lorrin Mais, MD

## 2020-11-13 ENCOUNTER — Telehealth: Payer: Self-pay

## 2020-11-13 NOTE — Telephone Encounter (Signed)
FETAL ECHO SCHEDULED FOR 11/26/2020@2P .

## 2020-11-18 ENCOUNTER — Ambulatory Visit (INDEPENDENT_AMBULATORY_CARE_PROVIDER_SITE_OTHER): Payer: BLUE CROSS/BLUE SHIELD | Admitting: Obstetrics and Gynecology

## 2020-11-18 ENCOUNTER — Other Ambulatory Visit: Payer: Self-pay

## 2020-11-18 ENCOUNTER — Encounter: Payer: Self-pay | Admitting: Obstetrics and Gynecology

## 2020-11-18 VITALS — BP 105/72 | HR 118 | Wt 260.0 lb

## 2020-11-18 DIAGNOSIS — O99210 Obesity complicating pregnancy, unspecified trimester: Secondary | ICD-10-CM | POA: Insufficient documentation

## 2020-11-18 DIAGNOSIS — O0990 Supervision of high risk pregnancy, unspecified, unspecified trimester: Secondary | ICD-10-CM

## 2020-11-18 DIAGNOSIS — M3320 Polymyositis, organ involvement unspecified: Secondary | ICD-10-CM

## 2020-11-18 DIAGNOSIS — R7303 Prediabetes: Secondary | ICD-10-CM

## 2020-11-18 MED ORDER — METFORMIN HCL 1000 MG PO TABS: 1000.0000 mg | ORAL_TABLET | Freq: Two times a day (BID) | ORAL | 3 refills | Status: DC

## 2020-11-18 NOTE — Progress Notes (Signed)
ROB, she forgot her BS log.  Fasting 114-130 after meals 130-180.  Reports no concerns today.

## 2020-11-18 NOTE — Progress Notes (Signed)
   PRENATAL VISIT NOTE  Subjective:  Rachael Patton is a 20 y.o. G1P0 at [redacted]w[redacted]d being seen today for ongoing prenatal care.  She is currently monitored for the following issues for this high-risk pregnancy and has Morbid obesity (HCC); Prolactin increased; Hypothyroidism; Prediabetes; Iron deficiency anemia; Elevated LFTs; Tarsal coalition of left foot; High risk medication use; Volume overload; Myositis associated antibody positive; ILD (interstitial lung disease) (HCC); Polymyositis (HCC); Supervision of high risk pregnancy, antepartum; and Maternal obesity affecting pregnancy, antepartum on their problem list.  Patient reports no complaints.  Contractions: Not present. Vag. Bleeding: None.  Movement: Present. Denies leaking of fluid.   The following portions of the patient's history were reviewed and updated as appropriate: allergies, current medications, past family history, past medical history, past social history, past surgical history and problem list.   Objective:   Vitals:   11/18/20 1032  BP: 105/72  Pulse: (!) 118  Weight: 260 lb (117.9 kg)    Fetal Status: Fetal Heart Rate (bpm): 150   Movement: Present     General:  Alert, oriented and cooperative. Patient is in no acute distress.  Skin: Skin is warm and dry. No rash noted.   Cardiovascular: Normal heart rate noted  Respiratory: Normal respiratory effort, no problems with respiration noted  Abdomen: Soft, gravid, appropriate for gestational age.  Pain/Pressure: Absent     Pelvic: Cervical exam deferred        Extremities: Normal range of motion.  Edema: None  Mental Status: Normal mood and affect. Normal behavior. Normal judgment and thought content.   Assessment and Plan:  Pregnancy: G1P0 at [redacted]w[redacted]d 1. Supervision of high risk pregnancy, antepartum Patient is doing well without complaints  2. Prediabetes Patient is not bring CBG log but reports fasting values in the 130s and pp between 130-180 Will increase metformin  to 1000 mg BID (from 500 mg at bedtime) Will refer patient to nutritionist as well for education Fetal echo scheduled on 8/31  3. Maternal obesity affecting pregnancy, antepartum Continue ASA  4. Polymyositis (HCC) Stable on steroid 10 mg and Imuran- would prefer if steroid could be discontinued given elevated CBG Follow up with pulmonologist on 8/26 and rheumatologist on 9/6 MFM growth ultrasound scheduled  Preterm labor symptoms and general obstetric precautions including but not limited to vaginal bleeding, contractions, leaking of fluid and fetal movement were reviewed in detail with the patient. Please refer to After Visit Summary for other counseling recommendations.   Return in about 4 weeks (around 12/16/2020) for in person, ROB, High risk.  Future Appointments  Date Time Provider Department Center  11/21/2020  1:45 PM Oretha Milch, MD [facility] None  12/02/2020  1:00 PM Fuller Plan, MD [facility] None  12/09/2020  3:30 PM [facility] NURSE [facility]  12/09/2020  3:45 PM [facility] US4 [facility]    Catalina Antigua, MD

## 2020-11-19 ENCOUNTER — Ambulatory Visit: Payer: BLUE CROSS/BLUE SHIELD | Admitting: Pulmonary Disease

## 2020-11-21 ENCOUNTER — Encounter: Payer: Self-pay | Admitting: Pulmonary Disease

## 2020-11-21 ENCOUNTER — Ambulatory Visit (INDEPENDENT_AMBULATORY_CARE_PROVIDER_SITE_OTHER): Payer: BLUE CROSS/BLUE SHIELD | Admitting: Pulmonary Disease

## 2020-11-21 ENCOUNTER — Other Ambulatory Visit: Payer: Self-pay

## 2020-11-21 VITALS — BP 118/80 | HR 111 | Temp 98.2°F | Ht 65.0 in | Wt 259.8 lb

## 2020-11-21 DIAGNOSIS — J849 Interstitial pulmonary disease, unspecified: Secondary | ICD-10-CM | POA: Diagnosis not present

## 2020-11-21 DIAGNOSIS — M3320 Polymyositis, organ involvement unspecified: Secondary | ICD-10-CM | POA: Diagnosis not present

## 2020-11-21 NOTE — Progress Notes (Signed)
   Subjective:    Patient ID: Rachael Patton, female    DOB: [birth year], 20 y.o.   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  She was treated with IV fluids and started on prednisone 80 mg/day.  CK improved from 11,[phone].  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  Anti-Jo antibody was negative   PMH -Covid infection January 2022  HRCT was scheduled for May but canceled since she was [redacted] weeks pregnant She is now about [redacted] weeks pregnant, reviewed MFM notes, she is being supervised for high risk pregnancy.  CBGs slight high and started on metformin MFM growth ultrasound has been scheduled. She denies cough, wheezing.  Has mild shortness of breath  Review of Systems neg for any significant sore throat, dysphagia, itching, sneezing, nasal congestion or excess/ purulent secretions, fever, chills, sweats, unintended wt loss, pleuritic or exertional cp, hempoptysis, orthopnea pnd or change in chronic leg swelling. Also denies presyncope, palpitations, heartburn, abdominal pain, nausea, vomiting, diarrhea or change in bowel or urinary habits, dysuria,hematuria, rash, arthralgias, visual complaints, headache, numbness weakness or ataxia.     Objective:   Physical Exam  Gen. Pleasant, obese, in no distress ENT - no lesions, no post nasal drip Neck: No JVD, no thyromegaly, no carotid bruits Lungs: no use of accessory muscles, no dullness to percussion, decreased without rales or rhonchi  Cardiovascular: Rhythm regular, heart sounds  normal,  no murmurs or gallops, no peripheral edema Musculoskeletal: No deformities, no cyanosis or clubbing , no tremors       Assessment & Plan:

## 2020-11-21 NOTE — Patient Instructions (Signed)
Please schedule high-resolution CT chest in 6 months

## 2020-11-21 NOTE — Assessment & Plan Note (Signed)
No evidence of worsening ILD -in fact her cough is mostly resolved.  Has mild shortness of breath which she attributes to pregnancy. Will defer high-resolution CT chest until after delivery, we will schedule 6 months

## 2020-11-21 NOTE — Assessment & Plan Note (Signed)
Appears to be in remission on Imuran, CBG slight high on 10 mg of prednisone, rheumatology to adjust

## 2020-11-25 ENCOUNTER — Ambulatory Visit: Payer: BLUE CROSS/BLUE SHIELD | Admitting: Registered"

## 2020-11-25 ENCOUNTER — Encounter: Payer: BLUE CROSS/BLUE SHIELD | Attending: Obstetrics and Gynecology | Admitting: Registered"

## 2020-11-25 ENCOUNTER — Other Ambulatory Visit: Payer: Self-pay

## 2020-11-25 DIAGNOSIS — R7303 Prediabetes: Secondary | ICD-10-CM | POA: Insufficient documentation

## 2020-11-25 NOTE — Progress Notes (Signed)
Patient was seen for Pre-exisiting pre-diabetes Diabetes self-management on 11/25/20  Start time 1320 and End time 1415   Estimated due date: 04/05/21; [redacted]w[redacted]d Clinical: Medications: metformin 1000 bid (pt taking differently misunderstood directions on label, taking 1000 mg qhs) iron, prednisone 10 mg for polymyositis  Medical History: preDM since childhood, controlled with metformin for a while but then taken off metformin when blood sugar normalized. Labs:  A1c 6.2% on 10/21/20  Dietary and Lifestyle History: Pt states her weight has fluctuated a lot. Pt states this year she developed polymyosistis and started steroid treatments. Pt reports the treatments have resolved her muscle weakness and SOB symptoms, but has increased her blood sugar and some weight gain.   Pt states she has been making improvements to her diet including   Physical Activity: ADL Stress: not assessed Sleep: not assessed  24 hr Recall:   First Meal: honey nut cheerios, milk Snack: choc chip chewy bar Second meal: fish sticks, fries Snack:  Third meal: t[place] cabbage,1 1/2 c mac n cheese Snack: dessert Beverages: water, body armour, powerade, juice followed by water, soda 3-4x/month  NUTRITION INTERVENTION  Nutrition education (E-1) on the following topics:   Initial Follow-up  _0  _1  Difference between Gestational Diabetes and pre-existing diabetes _2  _3  Why dietary management is important in controlling blood glucose _4  _5  Effects each nutrient has on blood glucose levels _6  _7  Simple carbohydrates vs complex carbohydrates _8  _9  Fluid intake _10  _11  Creating a balanced meal plan _12  _13  Carbohydrate counting  _14  _15  When to check blood glucose levels _16  _17  Proper blood glucose monitoring techniques _18  _19  Effect of stress and stress reduction techniques  _20  _21  Exercise effect on blood glucose levels, appropriate exercise during pregnancy _22  _23  Importance of limiting caffeine and abstaining from  alcohol and smoking _24  _25  Medications used for blood sugar control during pregnancy _26  _27  Hypoglycemia and rule of 15 _28  _29  Postpartum self care  Patient already has a meter, is testing pre breakfast and before each meal because that is what her endocrinologist asked, but will change to checking 2 hrs after meals. FBS: 100-132 mg/dL Postprandial: n/a   Premeal: 112-154 mg/dL  Patient instructed to monitor glucose levels: (goals may vary if injecting insulin) FBS: 60 - ? 95 mg/dL (some clinics use 90 for cutoff) 1 hour: ? 140 mg/dL 2 hour: ? 120 mg/dL  Patient received handouts: Nutrition and Diabetes in Pregnancy Carbohydrate Counting List  Patient will be seen for follow-up as needed.

## 2020-11-26 DIAGNOSIS — R768 Other specified abnormal immunological findings in serum: Secondary | ICD-10-CM | POA: Insufficient documentation

## 2020-12-01 NOTE — Progress Notes (Signed)
Office Visit Note  Patient: Rachael Patton             Date of Birth: [birth year]           MRN: [id number]             PCP: [name], PA Referring: [name], PA Visit Date: 12/02/2020   Subjective:  Follow-up (Doing great, right knee tingling after muscle biopsy)   History of Present Illness: Rachael Patton is a 20 y.o. female here for follow up for polymyositis on azathioprine 50 mg PO daily and prednisone 10 mg PO daily. She was started on metformin for worsening of hyperglycemia. She did not return for labs after past visit so remained on current medication dose. She feels symptoms are doing well. Right knee area remains numb in a small distribution unchanged since her biopsy.  Previous HPI: 09/01/20 Rachael Patton is a 20 y.o. 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 She was seen last visit with CK elevation noted again with prednisone taper and labs were obtained regarding additional DMARD treatment. She feels about the same as before still has fatigue but no swelling, no rashes, no weakness or myalgias. At last visit TPMT phenotype was checked with normal enzyme function.    Review of Systems  Constitutional:  Negative for fatigue.  HENT:  Negative for mouth dryness.   Eyes:  Negative for dryness.  Respiratory:  Negative for shortness of breath.   Cardiovascular:  Negative for swelling in legs/feet.  Gastrointestinal:  Negative for constipation.  Endocrine: Negative for excessive thirst.  Genitourinary:  Negative for difficulty urinating.  Musculoskeletal:  Negative for joint pain and joint pain.  Skin:  Negative for rash.  Allergic/Immunologic: Negative for susceptible to infections.  Neurological:  Negative for weakness.  Hematological:  Negative for bruising/bleeding tendency.  Psychiatric/Behavioral:  Negative for sleep disturbance.    PMFS History:  Patient Active Problem List   Diagnosis Date Noted   SS-A antibody  positive 11/26/2020   Maternal obesity affecting pregnancy, antepartum 11/18/2020   Supervision of high risk pregnancy, antepartum 10/21/2020   Polymyositis (HCC) 07/18/2020   ILD (interstitial lung disease) (HCC) 06/25/2020   Myositis associated antibody positive 06/23/2020   High risk medication use 05/08/2020   Volume overload 05/08/2020   Morbid obesity (HCC) 04/30/2020   Prolactin increased 04/30/2020   Hypothyroidism 04/30/2020   Prediabetes 04/30/2020   Iron deficiency anemia 04/30/2020   Elevated LFTs 04/30/2020   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    Past Medical History:  Diagnosis Date   Anemia    Asthma    As a child   Myositis    Pre-diabetes    Prediabetes    Rhabdomyolysis    Thyroid disease    found at age 79    Family History  Problem Relation Age of Onset   Diabetes Mother    Diabetes Maternal Grandmother    Diabetes Paternal Grandmother    Stroke Paternal Grandmother    Stroke Paternal Grandfather    Diabetes Paternal Grandfather    Multiple sclerosis Cousin    Lupus Cousin    Past Surgical History:  Procedure Laterality Date   FOOT SURGERY Left 06/2019   MUSCLE BIOPSY Right 05/14/2020   Procedure: RIGHT THIGH MUSCLE BIOPSY;  Surgeon: Fritzi Mandes, MD;  Location: [facility] OR;  Service: General;  Laterality: Right;  ROOM 3 STARTING AT 04:00PM FOR   Social History   Social History  Narrative   Graduated from [employer] in sociology/toxicology   Starting day care job soon   Lives with boyfriend   No children   Immunization History  Administered Date(s) Administered   HPV 9-valent 01/31/2020   PFIZER(Purple Top)SARS-COV-2 Vaccination 04/17/2020     Objective: Vital Signs: BP 107/70 (BP Location: Left Arm, Patient Position: Sitting, Cuff Size: Normal)   Pulse (!) 103   Resp 15   Ht  (1.651 m)   Wt 257 lb (116.6 kg)   LMP 07/03/2020 (LMP Unknown)   BMI 42.77 kg/m    Physical Exam Constitutional:      Appearance: She is obese.   HENT:     Right Ear: External ear normal.     Left Ear: External ear normal.     Mouth/Throat:     Mouth: Mucous membranes are moist.     Pharynx: Oropharynx is clear.  Eyes:     Conjunctiva/sclera: Conjunctivae normal.  Skin:    General: Skin is warm and dry.     Comments: Acneiform appearing rash on central forehead and scattered on face and upper torso No rash on extremities no central facial hyperpigmentation  Neurological:     General: No focal deficit present.     Mental Status: She is alert.     Deep Tendon Reflexes: Reflexes normal.  Psychiatric:        Mood and Affect: Mood normal.     Musculoskeletal Exam:  Elbows full ROM no tenderness or swelling Wrists full ROM no tenderness or swelling Fingers full ROM no tenderness or swelling Knees full ROM no tenderness or swelling   Investigation: No additional findings.  Imaging: [place] MFM OB DETAIL +14 WK  Result Date: 11/11/2020 ----------------------------------------------------------------------  OBSTETRICS REPORT                       (Signed Final 11/11/2020 01:18 pm) ---------------------------------------------------------------------- Patient Info  ID #:       [id number]                          D.O.B.:  2000-11-30 (20 yrs)  Name:       Rachael Patton                  Visit Date: 11/10/2020 09:50 am ---------------------------------------------------------------------- Performed By  Attending:        Lin Landsman      Ref. Address:     [facility]                                                             [facility]                                                             [address]  [address]                                                             [place]                                                             [address]  Performed By:     Emeline Darling BS,      Location[facility]  Referred By:      Lyn Henri MD ---------------------------------------------------------------------- Orders  #  Description                           Code        Ordered By  1  [place] MFM OB DETAIL +14 WK               76811.01    TIMOTHY GLASSER ----------------------------------------------------------------------  #  Order #                     Accession #                Episode #  1  [id number]                   [id number]                 [id number] ---------------------------------------------------------------------- Indications  [redacted] weeks gestation of pregnancy                Z3A.19  Encounter for antenatal screening for          Z36.3  malformations  Obesity complicating pregnancy, second         O99.212  trimester (BMI 41)  Negative AFP  Medical complication of pregnancy (Myositis)   O26.90 ---------------------------------------------------------------------- Vital Signs  Weight (lb):  246                               Height:        5'5"  BMI:         40.93 ---------------------------------------------------------------------- Fetal Evaluation  Num Of Fetuses:         1  Fetal Heart Rate(bpm):  148  Cardiac Activity:       Observed  Presentation:           Transverse, head to maternal right  Placenta:               Anterior  P. Cord Insertion:      Visualized  Amniotic Fluid  AFI FV:      Within normal limits                              Largest Pocket(cm)                              4. ---------------------------------------------------------------------- Biometry  BPD:      42.8  mm     G. Age:  19w 0d         42  %    CI:        72.17   %    70 - 86                                                          FL/HC:      18.5   %    16.1 - 18.3  HC:      160.3  mm     G. Age:  18w 6d         29  %     HC/AC:      1.12        1.09 - 1.39  AC:      143.5  mm     G. Age:  19w 5d         64  %    FL/BPD:     69.2   %  FL:       29.6  mm     G. Age:  19w 1d         42  %    FL/AC:      20.6   %    20 - 24  CER:      20.4  mm     G. Age:  19w 4d         63  %  NFT:       4.9  mm  LV:          6  mm  CM:        4.1  mm  Est. FW:     288  gm    0 lb 10 oz      58  % ---------------------------------------------------------------------- OB History  Gravidity:    1 ---------------------------------------------------------------------- Gestational Age  LMP:           18w 4d        Date:  07/03/20  EDD:   04/09/21  U/S Today:     19w 1d                                        EDD:   04/05/21  Best:          19w 1d     Det. By:  Previous Ultrasound      EDD:   04/05/21 ---------------------------------------------------------------------- Anatomy  Cranium:               Appears normal         Aortic Arch:            Appears normal  Cavum:                 Appears normal         Ductal Arch:            Appears normal  Ventricles:            Appears normal         Diaphragm:              Appears normal  Choroid Plexus:        Appears normal         Stomach:                Appears normal, left                                                                        sided  Cerebellum:            Appears normal         Abdomen:                Appears normal  Posterior Fossa:       Appears normal         Abdominal Wall:         Appears nml (cord                                                                        insert, abd wall)  Nuchal Fold:           Appears normal         Cord Vessels:           Appears normal (3                                                                        vessel cord)  Face:  Appears normal         Kidneys:                Appear normal                         (orbits and profile)  Lips:                  Appears normal         Bladder:                Appears normal   Thoracic:              Appears normal         Spine:                  Appears normal  Heart:                 Appears normal         Upper Extremities:      Appears normal                         (4CH, axis, and                         situs)  RVOT:                  Appears normal         Lower Extremities:      Appears normal  LVOT:                  Appears normal  Other:  Fetus appears to be female. Left hand and Left heel visualized.          Technically difficult due to maternal habitus and fetal position. ---------------------------------------------------------------------- Targeted Anatomy  Thorax  3 Vessel View:         Appears normal         3 V Trachea View:       Appears normal ---------------------------------------------------------------------- Cervix Uterus Adnexa  Cervix  Length:            3.9  cm.  Normal appearance by transabdominal scan. ---------------------------------------------------------------------- Impression  Ms. Boxell is a 20 yo G1P0 at 43 w 1 d who is here with an  EDD of 04/05/21. She is seen at the request of Dr. Scheryl Darter  She is overall doing well today. She denies signs or  symptoms of preterm labor or hypertension.  She had prenatal labs of a neg AFP. HbA1c at 6.2 neg  horizon. Normal TSH.  Pregnancy related issues:  1) Polymyositis with possible interstitial lung disease  Ms. Sane reports that she presented January with new onse  fatigue, diarrhea, sweats and non-productive cough. She had  mildly elevated LFT's at that time, positive COVD test. She  was sent to the ED and had an evaluation confirming the  above notes. In addition, she presented with proximal upper  and lower weakness with CK values at 11K. She was  suspected to have polymyositis and was placed on steroids  and IV fluids. She had normal renal function at that time. She  underwent a steroid taper beginning at 80 mg and on a  maintenance of 10 mg. She was started on Azathioprine with  the goal of weaning her  off of the predinsone. She has  since  been under the care of a rheumatologist Dr. [name] and a  pulmonologist Dr. Vassie Loll.  A muscle biopsy was performed on  the right thigh. Her serology tested positive for PL-12  antiboides which can be associated with antisynthetase  disease and interstitial lund disease. She was Anti-Jo  antibody negative.  Ms. Kiser denies difficulty breathing or  additional lung disease.  Lastly, she discovered pregnancy at 4 weeks pregnancy.  2) BMI 41  Ms. Goodridge is aware of total weight gain of 11-20 lbs.  3) Prediabetes of Hgb A1 6.20- she is taking her metformin,  but and reports good blood sugars but is not checking them 4  times per day.  Her medical history is significant for BMI >40, elevated LFT's,  preegestational diabetes, and polymyositis.  Imaging:  Single intrauterine pregnancy here for a detailed anatomy  due polymyositis on predinsone and azathioprine and an  elevated BMI of 41  Normal anatomy with measurements consistent with dates  There is good fetal movement and amniotic fluid volume  In addition we discussed starting daily low dose ASA for the  prevention of preeclampsia.  Counseling:  I discussed with Ms. Luevanos the normal nature of today's  ultrasound as stated above.  1) Polymyositis:  I discussed with Ms. Ishikawa that in general the patients with  polymyositis overall have good outcomes. Exacerbations are  managed primarily with prednisone and azathioprine. Ms.  Deeg was started on the medications early in pregnancy. I  reviwed the complications of the predisone include an  association with fetal growth restriction. There are some  increased risk for oral clefting in animal studies. Azathioprine  related exposure include preterm delivery, fetal growth  restriction and Atrial and ventricular septal defects. These  findings were included in a case report and therefore  uncertain if directly related to azathioprine directly. However,  given this we recommend serial growth exams.   Secondly, I encouraged her to wean off of prednisone given  her suspected pregestational diabetes, is possible. Her dose  was previously decreased to 5 mg daily but her symptoms  worsened and she was increased to 10 mg per day. If  azathioprine can manage her symptoms that would be  preferable.  Lastly, we discussed the increased risk for adrenal crisis in  women who have been on  of prednisone or more  throughout the pregnancy, requiring stress dose steroids.  Given that she is on 10 mg or less she will likely not need  stress dose steroids at the time of delivery.  2) elevated BMI:  We discussed the increased risk for gestational diabetes,  preeclampsia, fetal macrosomia, cesarean delivery and  thrombosis in pregnancy. We discussed overall weight gain  goal of 11-20 lbs.  3) Prediabetes:  I discussed with Ms. Quadros that her hgbA1c of 6.2 places her  at risk for possible diabetes. We would recommend either  FBS and 2hr PP glucose checks or oral GTT for GDM.  Recommendations:  1) Lowest effective dose of prednisone, if it she can't be  weaned off.  2) Serial growth exams every 4 weeks  3) Initiate weekly testing at 36 weeks give elevated BMI.  4) Delivery by 39-40 weeks.  5) Consider low dose ASA.  I spent 60 minutes with > 50% in face to face consultation.  Novella Olive, MD ----------------------------------------------------------------------               Lin Landsman, MD Electronically Signed Final Report   11/11/2020 01:18 pm ----------------------------------------------------------------------  Recent Labs: Lab Results  Component Value Date   WBC 10.3 07/30/2020   HGB 12.6 07/30/2020   PLT 330 07/30/2020   NA 136 07/30/2020   K 4.1 07/30/2020   CL 104 07/30/2020   CO2 23 07/30/2020   GLUCOSE 122 (H) 07/30/2020   BUN 8 07/30/2020   CREATININE 0.58 07/30/2020   BILITOT 0.4 07/30/2020   ALKPHOS 38 05/04/2020   AST 23 07/30/2020   ALT 12 07/30/2020   PROT 7.3 07/30/2020    ALBUMIN 2.5 (L) 05/04/2020   CALCIUM 9.0 07/30/2020   GFRAA 153 07/30/2020    Speciality Comments: No specialty comments available.  Procedures:  No procedures performed Allergies: Patient has no known allergies.   Assessment / Plan:     Visit Diagnoses: Polymyositis (HCC) - Plan: CK  No evidence of disease activity since our last visit which is great.  Physical exam looks normal only active skin disease looks more like acne probably related to hormones and corticosteroids.  Check CK level again today assuming it is not worsening we will try to decrease prednisone dose.  If it is still elevated would also titrate up the Imuran currently she is at a low dose for her size and normal renal function.  Prediabetes  Hyperglycemia now on metformin being followed with OB for this problem likely exacerbated by chronic prednisone treatment.  We will see if we can reduce this medicine to minimize risk of complications.  High risk medication use - Plan: COMPLETE METABOLIC PANEL WITH GFR, CBC with Differential/Platelet  Need repeat labs since starting low-dose azathioprine for myositis we will check CBC and CMP today.  Orders: Orders Placed This Encounter  Procedures   CK   COMPLETE METABOLIC PANEL WITH GFR   CBC with Differential/Platelet   No orders of the defined types were placed in this encounter.    Follow-Up Instructions: Return in about 3 months (around 03/03/2021) for Myositis on AZA/GC f/u 107mos.   Fuller Plan, MD  Note - This record has been created using [employer].  Chart creation errors have been sought, but may not always  have been located. Such creation errors do not reflect on  the standard of medical care.

## 2020-12-02 ENCOUNTER — Ambulatory Visit (INDEPENDENT_AMBULATORY_CARE_PROVIDER_SITE_OTHER): Payer: BLUE CROSS/BLUE SHIELD | Admitting: Internal Medicine

## 2020-12-02 ENCOUNTER — Encounter: Payer: Self-pay | Admitting: Internal Medicine

## 2020-12-02 ENCOUNTER — Other Ambulatory Visit: Payer: Self-pay

## 2020-12-02 VITALS — BP 107/70 | HR 103 | Resp 15 | Ht 65.0 in | Wt 257.0 lb

## 2020-12-02 DIAGNOSIS — M3320 Polymyositis, organ involvement unspecified: Secondary | ICD-10-CM

## 2020-12-02 DIAGNOSIS — Z79899 Other long term (current) drug therapy: Secondary | ICD-10-CM

## 2020-12-02 DIAGNOSIS — R7303 Prediabetes: Secondary | ICD-10-CM | POA: Diagnosis not present

## 2020-12-02 NOTE — Patient Instructions (Addendum)
I will review today's lab results if numbers look okay we can plan to reduce the prednisone dose since this is probably contributing to some issues such as weight gain or high blood sugar. If CK is still elevated will recommend increasing the azathioprine dose, will contact you during this week.

## 2020-12-03 LAB — CBC WITH DIFFERENTIAL/PLATELET
Absolute Monocytes: 951 cells/uL — ABNORMAL HIGH (ref 200–950)
Basophils Absolute: 35 cells/uL (ref 0–200)
Basophils Relative: 0.3 %
Eosinophils Absolute: 116 cells/uL (ref 15–500)
Eosinophils Relative: 1 %
HCT: 36.3 % (ref 35.0–45.0)
Hemoglobin: 11.6 g/dL — ABNORMAL LOW (ref 11.7–15.5)
Lymphs Abs: 1241 cells/uL (ref 850–3900)
MCH: 26.7 pg — ABNORMAL LOW (ref 27.0–33.0)
MCHC: 32 g/dL (ref 32.0–36.0)
MCV: 83.6 fL (ref 80.0–100.0)
MPV: 11.6 fL (ref 7.5–12.5)
Monocytes Relative: 8.2 %
Neutro Abs: 9257 cells/uL — ABNORMAL HIGH (ref 1500–7800)
Neutrophils Relative %: 79.8 %
Platelets: 245 10*3/uL (ref 140–400)
RBC: 4.34 10*6/uL (ref 3.80–5.10)
RDW: 13.2 % (ref 11.0–15.0)
Total Lymphocyte: 10.7 %
WBC: 11.6 10*3/uL — ABNORMAL HIGH (ref 3.8–10.8)

## 2020-12-03 LAB — COMPLETE METABOLIC PANEL WITH GFR
AG Ratio: 1 (calc) (ref 1.0–2.5)
ALT: 29 U/L (ref 6–29)
AST: 26 U/L (ref 10–30)
Albumin: 3.6 g/dL (ref 3.6–5.1)
Alkaline phosphatase (APISO): 70 U/L (ref 31–125)
BUN: 7 mg/dL (ref 7–25)
CO2: 25 mmol/L (ref 20–32)
Calcium: 9.4 mg/dL (ref 8.6–10.2)
Chloride: 103 mmol/L (ref 98–110)
Creat: 0.55 mg/dL (ref 0.50–0.96)
Globulin: 3.7 g/dL (calc) (ref 1.9–3.7)
Glucose, Bld: 98 mg/dL (ref 65–99)
Potassium: 4 mmol/L (ref 3.5–5.3)
Sodium: 136 mmol/L (ref 135–146)
Total Bilirubin: 0.3 mg/dL (ref 0.2–1.2)
Total Protein: 7.3 g/dL (ref 6.1–8.1)
eGFR: 133 mL/min/{1.73_m2} (ref >=60)

## 2020-12-03 LAB — CK: Total CK: 272 U/L — ABNORMAL HIGH (ref 29–143)

## 2020-12-03 MED ORDER — AZATHIOPRINE 50 MG PO TABS: 100.0000 mg | ORAL_TABLET | Freq: Every day | ORAL | 2 refills | Status: DC

## 2020-12-03 MED ORDER — PREDNISONE 5 MG PO TABS: 5.0000 mg | ORAL_TABLET | Freq: Every day | ORAL | 2 refills | Status: DC

## 2020-12-03 NOTE — Addendum Note (Signed)
Addended by: Fuller Plan on: 12/03/2020 07:56 AM   Modules accepted: Orders

## 2020-12-03 NOTE — Progress Notes (Signed)
Results look fine for medication tolerance. Her CK level is down to 272 from 470 this is above a normal level but barely. I recommend increasing the azathioprine dose to 2 tablets daily and cutting the prednisone in half. I sent new prescriptions reflecting these changes and the half strength prednisone pills, but she can take her current pills at the modified amount as well.

## 2020-12-09 ENCOUNTER — Ambulatory Visit: Payer: BLUE CROSS/BLUE SHIELD | Attending: Maternal & Fetal Medicine

## 2020-12-09 ENCOUNTER — Other Ambulatory Visit: Payer: Self-pay

## 2020-12-09 ENCOUNTER — Other Ambulatory Visit: Payer: Self-pay | Admitting: Maternal & Fetal Medicine

## 2020-12-09 ENCOUNTER — Encounter: Payer: Self-pay | Admitting: *Deleted

## 2020-12-09 ENCOUNTER — Ambulatory Visit: Payer: BLUE CROSS/BLUE SHIELD | Admitting: *Deleted

## 2020-12-09 VITALS — BP 111/64 | HR 86

## 2020-12-09 DIAGNOSIS — M609 Myositis, unspecified: Secondary | ICD-10-CM | POA: Diagnosis not present

## 2020-12-09 DIAGNOSIS — Z6841 Body Mass Index (BMI) 40.0 and over, adult: Secondary | ICD-10-CM

## 2020-12-09 DIAGNOSIS — O2692 Pregnancy related conditions, unspecified, second trimester: Secondary | ICD-10-CM | POA: Insufficient documentation

## 2020-12-09 DIAGNOSIS — E669 Obesity, unspecified: Secondary | ICD-10-CM | POA: Diagnosis not present

## 2020-12-09 DIAGNOSIS — O99212 Obesity complicating pregnancy, second trimester: Secondary | ICD-10-CM | POA: Insufficient documentation

## 2020-12-09 DIAGNOSIS — O99210 Obesity complicating pregnancy, unspecified trimester: Secondary | ICD-10-CM

## 2020-12-09 DIAGNOSIS — Z362 Encounter for other antenatal screening follow-up: Secondary | ICD-10-CM | POA: Insufficient documentation

## 2020-12-09 DIAGNOSIS — Z3A23 23 weeks gestation of pregnancy: Secondary | ICD-10-CM | POA: Insufficient documentation

## 2020-12-10 ENCOUNTER — Other Ambulatory Visit: Payer: Self-pay | Admitting: *Deleted

## 2020-12-10 DIAGNOSIS — M3320 Polymyositis, organ involvement unspecified: Secondary | ICD-10-CM

## 2020-12-16 ENCOUNTER — Encounter: Payer: BLUE CROSS/BLUE SHIELD | Admitting: Obstetrics & Gynecology

## 2020-12-22 ENCOUNTER — Other Ambulatory Visit: Payer: Self-pay

## 2020-12-22 ENCOUNTER — Ambulatory Visit (INDEPENDENT_AMBULATORY_CARE_PROVIDER_SITE_OTHER): Payer: BLUE CROSS/BLUE SHIELD | Admitting: Obstetrics and Gynecology

## 2020-12-22 VITALS — BP 109/76 | HR 120 | Wt 257.0 lb

## 2020-12-22 DIAGNOSIS — M3320 Polymyositis, organ involvement unspecified: Secondary | ICD-10-CM

## 2020-12-22 DIAGNOSIS — O0990 Supervision of high risk pregnancy, unspecified, unspecified trimester: Secondary | ICD-10-CM

## 2020-12-22 DIAGNOSIS — R7303 Prediabetes: Secondary | ICD-10-CM

## 2020-12-22 DIAGNOSIS — O99210 Obesity complicating pregnancy, unspecified trimester: Secondary | ICD-10-CM

## 2020-12-22 NOTE — Progress Notes (Signed)
HROB, Fasting BS 112-120 and after meals 120-130

## 2020-12-22 NOTE — Progress Notes (Signed)
   PRENATAL VISIT NOTE  Subjective:  Rachael Patton is a 20 y.o. G1P0 at [redacted]w[redacted]d being seen today for ongoing prenatal care.  She is currently monitored for the following issues for this high-risk pregnancy and has Morbid obesity (HCC); Prolactin increased; Hypothyroidism; Prediabetes; Iron deficiency anemia; Elevated LFTs; Tarsal coalition of left foot; High risk medication use; Volume overload; Myositis associated antibody positive; ILD (interstitial lung disease) (HCC); Polymyositis (HCC); Supervision of high risk pregnancy, antepartum; Maternal obesity affecting pregnancy, antepartum; and SS-A antibody positive on their problem list.  Patient reports no complaints.  Contractions: Not present. Vag. Bleeding: None.  Movement: Present. Denies leaking of fluid.   The following portions of the patient's history were reviewed and updated as appropriate: allergies, current medications, past family history, past medical history, past social history, past surgical history and problem list.   Objective:   Vitals:   12/22/20 1350  BP: 109/76  Pulse: (!) 120  Weight: 257 lb (116.6 kg)    Fetal Status: Fetal Heart Rate (bpm): 157 Fundal Height: 27 cm Movement: Present     General:  Alert, oriented and cooperative. Patient is in no acute distress.  Skin: Skin is warm and dry. No rash noted.   Cardiovascular: Normal heart rate noted  Respiratory: Normal respiratory effort, no problems with respiration noted  Abdomen: Soft, gravid, appropriate for gestational age.  Pain/Pressure: Absent     Pelvic: Cervical exam deferred        Extremities: Normal range of motion.  Edema: None  Mental Status: Normal mood and affect. Normal behavior. Normal judgment and thought content.   Assessment and Plan:  Pregnancy: G1P0 at [redacted]w[redacted]d 1. Supervision of high risk pregnancy, antepartum Patient is doing well without complaints  2. Maternal obesity affecting pregnancy, antepartum Continue ASA  3. Prediabetes CBGs  reviewed and great majority of pp within range Fasting are elevated with most values in the low 100's and as high as 120 Discussed starting insulin and patient is hesitant Discussed consuming a protein rich snack at bedtime- patient understands that if no improvement, we will start insulin  4. Polymyositis (HCC) Seen by pulmonologist and rheumatologist recently- stable at this time Medication dosing was adjusted  Preterm labor symptoms and general obstetric precautions including but not limited to vaginal bleeding, contractions, leaking of fluid and fetal movement were reviewed in detail with the patient. Please refer to After Visit Summary for other counseling recommendations.   Return in about 2 weeks (around 01/05/2021) for in person, ROB, High risk.  Future Appointments  Date Time Provider Department Center  01/06/2021  3:00 PM [facility] NURSE [facility]  01/06/2021  3:15 PM [facility] US2 [facility]  03/04/2021  1:00 PM Rice, Jamesetta Orleans, MD [facility] None    Catalina Antigua, MD

## 2020-12-30 ENCOUNTER — Other Ambulatory Visit: Payer: Self-pay | Admitting: Internal Medicine

## 2020-12-30 DIAGNOSIS — M3320 Polymyositis, organ involvement unspecified: Secondary | ICD-10-CM

## 2021-01-04 ENCOUNTER — Other Ambulatory Visit: Payer: Self-pay | Admitting: Internal Medicine

## 2021-01-04 DIAGNOSIS — M3320 Polymyositis, organ involvement unspecified: Secondary | ICD-10-CM

## 2021-01-05 ENCOUNTER — Ambulatory Visit (INDEPENDENT_AMBULATORY_CARE_PROVIDER_SITE_OTHER): Payer: BLUE CROSS/BLUE SHIELD | Admitting: Obstetrics and Gynecology

## 2021-01-05 ENCOUNTER — Other Ambulatory Visit: Payer: Self-pay

## 2021-01-05 VITALS — BP 109/78 | HR 132 | Wt 254.0 lb

## 2021-01-05 DIAGNOSIS — M3320 Polymyositis, organ involvement unspecified: Secondary | ICD-10-CM

## 2021-01-05 DIAGNOSIS — E039 Hypothyroidism, unspecified: Secondary | ICD-10-CM

## 2021-01-05 DIAGNOSIS — R7303 Prediabetes: Secondary | ICD-10-CM

## 2021-01-05 DIAGNOSIS — Q998 Other specified chromosome abnormalities: Secondary | ICD-10-CM

## 2021-01-05 DIAGNOSIS — O0990 Supervision of high risk pregnancy, unspecified, unspecified trimester: Secondary | ICD-10-CM

## 2021-01-05 DIAGNOSIS — J849 Interstitial pulmonary disease, unspecified: Secondary | ICD-10-CM

## 2021-01-05 DIAGNOSIS — R768 Other specified abnormal immunological findings in serum: Secondary | ICD-10-CM

## 2021-01-05 LAB — OB RESULTS CONSOLE RPR: RPR: NONREACTIVE

## 2021-01-05 NOTE — Progress Notes (Signed)
Subjective:  Rachael Patton is a 20 y.o. G1P0 at [redacted]w[redacted]d being seen today for ongoing prenatal care.  She is currently monitored for the following issues for this high-risk pregnancy and has Morbid obesity (HCC); Prolactin increased; Hypothyroidism; Prediabetes; Iron deficiency anemia; Tarsal coalition of left foot; High risk medication use; Myositis associated antibody positive; ILD (interstitial lung disease) (HCC); Polymyositis (HCC); Supervision of high risk pregnancy, antepartum; Maternal obesity affecting pregnancy, antepartum; and SS-A antibody positive on their problem list.  Patient reports general discomforts of pregnancy.  Contractions: Not present. Vag. Bleeding: None.  Movement: Present. Denies leaking of fluid.   The following portions of the patient's history were reviewed and updated as appropriate: allergies, current medications, past family history, past medical history, past social history, past surgical history and problem list. Problem list updated.  Objective:   Vitals:   01/05/21 1431  BP: 109/78  Pulse: (!) 132  Weight: 254 lb (115.2 kg)    Fetal Status: Fetal Heart Rate (bpm): 156   Movement: Present     General:  Alert, oriented and cooperative. Patient is in no acute distress.  Skin: Skin is warm and dry. No rash noted.   Cardiovascular: Normal heart rate noted  Respiratory: Normal respiratory effort, no problems with respiration noted  Abdomen: Soft, gravid, appropriate for gestational age. Pain/Pressure: Absent     Pelvic:  Cervical exam deferred        Extremities: Normal range of motion.  Edema: None  Mental Status: Normal mood and affect. Normal behavior. Normal judgment and thought content.   Urinalysis:      Assessment and Plan:  Pregnancy: G1P0 at [redacted]w[redacted]d  1. Supervision of high risk pregnancy, antepartum Stable - CBC - RPR - HIV antibody (with reflex) Growth scans and antenatal testing as per MFM  2. SS-A antibody positive Stable Followed by  Pulmonary and Rheumatology Continue with current treatment regiment   3. Polymyositis (HCC) See above  4. ILD (interstitial lung disease) (HCC) See above  5. Myositis associated antibody positive See above  6. Prediabetes CBG's still elevated despite diet and Metformin. Pt is on chronic steroids due to above conditions. Will refer to DM to start insulin. - Referral to Nutrition and Diabetes Services  7. Hypothyroidism, unspecified type Stable without meds  Preterm labor symptoms and general obstetric precautions including but not limited to vaginal bleeding, contractions, leaking of fluid and fetal movement were reviewed in detail with the patient. Please refer to After Visit Summary for other counseling recommendations.  Return in about 2 weeks (around 01/19/2021) for OB visit, face to face, MD only.   Hermina Staggers, MD

## 2021-01-05 NOTE — Progress Notes (Signed)
+   fetal movement. No complaints.  

## 2021-01-05 NOTE — Patient Instructions (Signed)

## 2021-01-06 ENCOUNTER — Ambulatory Visit: Payer: BLUE CROSS/BLUE SHIELD

## 2021-01-06 LAB — CBC
Hematocrit: 36.1 % (ref 34.0–46.6)
Hemoglobin: 11.8 g/dL (ref 11.1–15.9)
MCH: 26.7 pg (ref 26.6–33.0)
MCHC: 32.7 g/dL (ref 31.5–35.7)
MCV: 82 fL (ref 79–97)
Platelets: 278 10*3/uL (ref 150–450)
RBC: 4.42 x10E6/uL (ref 3.77–5.28)
RDW: 12.9 % (ref 11.7–15.4)
WBC: 10 10*3/uL (ref 3.4–10.8)

## 2021-01-06 LAB — HIV ANTIBODY (ROUTINE TESTING W REFLEX): HIV Screen 4th Generation wRfx: NONREACTIVE

## 2021-01-06 LAB — RPR: RPR Ser Ql: NONREACTIVE

## 2021-01-07 ENCOUNTER — Other Ambulatory Visit: Payer: Self-pay

## 2021-01-07 ENCOUNTER — Ambulatory Visit: Payer: BLUE CROSS/BLUE SHIELD | Admitting: *Deleted

## 2021-01-07 ENCOUNTER — Other Ambulatory Visit: Payer: Self-pay | Admitting: *Deleted

## 2021-01-07 ENCOUNTER — Ambulatory Visit: Payer: BLUE CROSS/BLUE SHIELD | Admitting: Registered"

## 2021-01-07 ENCOUNTER — Encounter: Payer: Medicaid Other | Attending: Obstetrics and Gynecology | Admitting: Registered"

## 2021-01-07 ENCOUNTER — Ambulatory Visit: Payer: BLUE CROSS/BLUE SHIELD | Attending: Obstetrics

## 2021-01-07 ENCOUNTER — Encounter: Payer: Self-pay | Admitting: *Deleted

## 2021-01-07 VITALS — BP 112/69 | HR 92

## 2021-01-07 DIAGNOSIS — E669 Obesity, unspecified: Secondary | ICD-10-CM

## 2021-01-07 DIAGNOSIS — O99212 Obesity complicating pregnancy, second trimester: Secondary | ICD-10-CM | POA: Diagnosis not present

## 2021-01-07 DIAGNOSIS — M3320 Polymyositis, organ involvement unspecified: Secondary | ICD-10-CM | POA: Diagnosis not present

## 2021-01-07 DIAGNOSIS — Z3A27 27 weeks gestation of pregnancy: Secondary | ICD-10-CM

## 2021-01-07 DIAGNOSIS — O24313 Unspecified pre-existing diabetes mellitus in pregnancy, third trimester: Secondary | ICD-10-CM

## 2021-01-07 DIAGNOSIS — O99210 Obesity complicating pregnancy, unspecified trimester: Secondary | ICD-10-CM | POA: Diagnosis present

## 2021-01-07 DIAGNOSIS — R7303 Prediabetes: Secondary | ICD-10-CM | POA: Insufficient documentation

## 2021-01-07 DIAGNOSIS — O2693 Pregnancy related conditions, unspecified, third trimester: Secondary | ICD-10-CM | POA: Diagnosis not present

## 2021-01-07 DIAGNOSIS — O0990 Supervision of high risk pregnancy, unspecified, unspecified trimester: Secondary | ICD-10-CM

## 2021-01-07 DIAGNOSIS — O24414 Gestational diabetes mellitus in pregnancy, insulin controlled: Secondary | ICD-10-CM | POA: Insufficient documentation

## 2021-01-07 MED ORDER — ONETOUCH VERIO VI STRP: ORAL_STRIP | 5 refills | Status: DC

## 2021-01-07 NOTE — Progress Notes (Signed)
Insulin Instruction  Patient was seen on 01/07/2021 for insulin instruction.  Start time: 0820  End time: 0855  Patient reports SMBG readings: FBS: 100-130 mg/dL PPBG: 120-130 mg/dL  MD orders are: NPH 15 u qhs Patient to continue metformin 1000 bid  The following learning objectives were met by the patient during this visit:   Insulin Action of NPH and Novolog insulins  Reviewed syringe & vial including # units per syringe and vial  Hygiene and storage  Drawing up single doses if using vials   Single dose  Rotation of Sites  Hypoglycemia- symptoms, causes, treatment choices  Record keeping and MD follow up  Patient demonstrated understanding of insulin administration by return demonstration.  Patient received the following handouts: Insulin Instruction Handout                                        Patient to start on insulin as Rx'd by MD  Patient will be seen for follow-up in 1 weeks or as needed.

## 2021-01-07 NOTE — Progress Notes (Signed)
Order for glucose test strips sent to pharmacy.

## 2021-01-08 ENCOUNTER — Other Ambulatory Visit: Payer: Self-pay | Admitting: *Deleted

## 2021-01-08 MED ORDER — INSULIN NPH (HUMAN) (ISOPHANE) 100 UNIT/ML ~~LOC~~ SUSP: 15.0000 [IU] | Freq: Every day | SUBCUTANEOUS | 3 refills | Status: DC

## 2021-01-08 MED ORDER — "INSULIN SYRINGE-NEEDLE U-100 30G X 15/64"" 0.5 ML MISC": 3 refills | Status: DC

## 2021-01-08 NOTE — Progress Notes (Signed)
Insulin orders placed per Fonnie Mu, RD request. NPH and syringe/needles ordered today.

## 2021-01-12 ENCOUNTER — Other Ambulatory Visit: Payer: Self-pay | Admitting: *Deleted

## 2021-01-12 DIAGNOSIS — O24414 Gestational diabetes mellitus in pregnancy, insulin controlled: Secondary | ICD-10-CM

## 2021-01-19 ENCOUNTER — Other Ambulatory Visit: Payer: Self-pay

## 2021-01-19 ENCOUNTER — Ambulatory Visit (INDEPENDENT_AMBULATORY_CARE_PROVIDER_SITE_OTHER): Payer: BLUE CROSS/BLUE SHIELD | Admitting: Obstetrics and Gynecology

## 2021-01-19 ENCOUNTER — Encounter: Payer: Self-pay | Admitting: Obstetrics and Gynecology

## 2021-01-19 VITALS — BP 121/82 | HR 121 | Wt 255.0 lb

## 2021-01-19 DIAGNOSIS — M3320 Polymyositis, organ involvement unspecified: Secondary | ICD-10-CM

## 2021-01-19 DIAGNOSIS — J849 Interstitial pulmonary disease, unspecified: Secondary | ICD-10-CM

## 2021-01-19 DIAGNOSIS — R768 Other specified abnormal immunological findings in serum: Secondary | ICD-10-CM

## 2021-01-19 DIAGNOSIS — O0990 Supervision of high risk pregnancy, unspecified, unspecified trimester: Secondary | ICD-10-CM

## 2021-01-19 DIAGNOSIS — O24414 Gestational diabetes mellitus in pregnancy, insulin controlled: Secondary | ICD-10-CM

## 2021-01-19 NOTE — Patient Instructions (Signed)

## 2021-01-19 NOTE — Progress Notes (Signed)
Patient presents for ROB. Patient states that her blood sugar levels are about the same. She has her log. She would like to discuss. No other concerns.

## 2021-01-19 NOTE — Progress Notes (Signed)
Subjective:  Rachael Patton is a 20 y.o. G1P0 at [redacted]w[redacted]d being seen today for ongoing prenatal care.  She is currently monitored for the following issues for this high-risk pregnancy and has Morbid obesity (HCC); Prolactin increased; Hypothyroidism; Prediabetes; Iron deficiency anemia; Tarsal coalition of left foot; High risk medication use; Myositis associated antibody positive; ILD (interstitial lung disease) (HCC); Polymyositis (HCC); Supervision of high risk pregnancy, antepartum; Maternal obesity affecting pregnancy, antepartum; SS-A antibody positive; and Insulin controlled gestational diabetes mellitus (GDM) in third trimester on their problem list.  Patient reports general discomforts of pregnancy.  Contractions: Not present. Vag. Bleeding: None.  Movement: Present. Denies leaking of fluid.   The following portions of the patient's history were reviewed and updated as appropriate: allergies, current medications, past family history, past medical history, past social history, past surgical history and problem list. Problem list updated.  Objective:   Vitals:   01/19/21 1547  BP: 121/82  Pulse: (!) 121  Weight: 255 lb (115.7 kg)    Fetal Status: Fetal Heart Rate (bpm): 149   Movement: Present     General:  Alert, oriented and cooperative. Patient is in no acute distress.  Skin: Skin is warm and dry. No rash noted.   Cardiovascular: Normal heart rate noted  Respiratory: Normal respiratory effort, no problems with respiration noted  Abdomen: Soft, gravid, appropriate for gestational age. Pain/Pressure: Absent     Pelvic:  Cervical exam deferred        Extremities: Normal range of motion.  Edema: None  Mental Status: Normal mood and affect. Normal behavior. Normal judgment and thought content.   Urinalysis:      Assessment and Plan:  Pregnancy: G1P0 at [redacted]w[redacted]d  1. Supervision of high risk pregnancy, antepartum Stable  2. Insulin controlled gestational diabetes mellitus (GDM) in  third trimester Taking 15 units NHP qhs since last appt. Fasting CBG's still elevated as well some 2 hr PP Will increase qhs insulin to 20 units and start 10 units with breakfast Scheduled for growth and antenatal testing on 02/05/21 with MFM   3. SS-A antibody positive Stable Saw Peds card's, infant needs EKG post delivery  4. Polymyositis (HCC) Stable Followed by Rheumatology and Pulmonary  5. ILD (interstitial lung disease) (HCC) Stable  See above  Preterm labor symptoms and general obstetric precautions including but not limited to vaginal bleeding, contractions, leaking of fluid and fetal movement were reviewed in detail with the patient. Please refer to After Visit Summary for other counseling recommendations.  Return in about 2 weeks (around 02/02/2021) for OB visit, face to face, MD only.   Rachael Staggers, MD

## 2021-01-28 ENCOUNTER — Other Ambulatory Visit: Payer: Self-pay

## 2021-01-30 ENCOUNTER — Other Ambulatory Visit: Payer: Self-pay | Admitting: Internal Medicine

## 2021-01-30 DIAGNOSIS — M3320 Polymyositis, organ involvement unspecified: Secondary | ICD-10-CM

## 2021-02-02 ENCOUNTER — Other Ambulatory Visit: Payer: Self-pay

## 2021-02-02 ENCOUNTER — Ambulatory Visit (INDEPENDENT_AMBULATORY_CARE_PROVIDER_SITE_OTHER): Payer: BLUE CROSS/BLUE SHIELD | Admitting: Obstetrics and Gynecology

## 2021-02-02 ENCOUNTER — Encounter: Payer: Self-pay | Admitting: Obstetrics and Gynecology

## 2021-02-02 VITALS — BP 111/78 | HR 87 | Wt 253.0 lb

## 2021-02-02 DIAGNOSIS — R768 Other specified abnormal immunological findings in serum: Secondary | ICD-10-CM

## 2021-02-02 DIAGNOSIS — O24414 Gestational diabetes mellitus in pregnancy, insulin controlled: Secondary | ICD-10-CM

## 2021-02-02 DIAGNOSIS — M3320 Polymyositis, organ involvement unspecified: Secondary | ICD-10-CM

## 2021-02-02 DIAGNOSIS — O99210 Obesity complicating pregnancy, unspecified trimester: Secondary | ICD-10-CM

## 2021-02-02 DIAGNOSIS — O0990 Supervision of high risk pregnancy, unspecified, unspecified trimester: Secondary | ICD-10-CM

## 2021-02-02 NOTE — Progress Notes (Signed)
   PRENATAL VISIT NOTE  Subjective:  Rachael Patton is a 20 y.o. G1P0 at [redacted]w[redacted]d being seen today for ongoing prenatal care.  She is currently monitored for the following issues for this high-risk pregnancy and has Morbid obesity (HCC); Prolactin increased; Hypothyroidism; Prediabetes; Iron deficiency anemia; Tarsal coalition of left foot; High risk medication use; Myositis associated antibody positive; ILD (interstitial lung disease) (HCC); Polymyositis (HCC); Supervision of high risk pregnancy, antepartum; Maternal obesity affecting pregnancy, antepartum; SS-A antibody positive; and Insulin controlled gestational diabetes mellitus (GDM) in third trimester on their problem list.  Patient reports no complaints.  Contractions: Not present. Vag. Bleeding: None.  Movement: Present. Denies leaking of fluid.   The following portions of the patient's history were reviewed and updated as appropriate: allergies, current medications, past family history, past medical history, past social history, past surgical history and problem list.   Objective:   Vitals:   02/02/21 1426  BP: 111/78  Pulse: 87  Weight: 253 lb (114.8 kg)    Fetal Status: Fetal Heart Rate (bpm): 129 Fundal Height: 32 cm Movement: Present     General:  Alert, oriented and cooperative. Patient is in no acute distress.  Skin: Skin is warm and dry. No rash noted.   Cardiovascular: Normal heart rate noted  Respiratory: Normal respiratory effort, no problems with respiration noted  Abdomen: Soft, gravid, appropriate for gestational age.  Pain/Pressure: Absent     Pelvic: Cervical exam deferred        Extremities: Normal range of motion.  Edema: None  Mental Status: Normal mood and affect. Normal behavior. Normal judgment and thought content.   Assessment and Plan:  Pregnancy: G1P0 at [redacted]w[redacted]d 1. Supervision of high risk pregnancy, antepartum Patient is doing well without complaints Patient undecided on pediatrician and contraception-  list provided  2. Insulin controlled gestational diabetes mellitus (GDM) in third trimester CBGs reviewed and much improved over the past 3 days as patient admits to less late night eating/snacking WIll continue with current insulin dosing and metformin Follow up ultrasound per MFM schedule  3. Maternal obesity affecting pregnancy, antepartum   4. Polymyositis (HCC) Stable  5. SS-A antibody positive Plan for postnatal EKG  Preterm labor symptoms and general obstetric precautions including but not limited to vaginal bleeding, contractions, leaking of fluid and fetal movement were reviewed in detail with the patient. Please refer to After Visit Summary for other counseling recommendations.   Return in about 2 weeks (around 02/16/2021) for in person, ROB, High risk.  Future Appointments  Date Time Provider Department Center  02/05/2021  3:30 PM [facility] NURSE [facility]  02/05/2021  3:45 PM [facility]5 [facility]  02/12/2021  3:30 PM [facility]SE [facility]  02/12/2021  3:45 PM [facility]4 [facility]  03/04/2021  1:00 PM Rice, Jamesetta Orleans, MD [facility] None    Catalina Antigua, MD

## 2021-02-02 NOTE — Patient Instructions (Signed)
AREA PEDIATRIC/FAMILY PRACTICE PHYSICIANS  Central/[place]outheast [place] ([address]) [facility] Chambliss, MD; Eniola, MD; Hale, MD; Hensel, MD; McDiarmid, MD; McIntyer, MD; Neal, MD; Walden, MD [address] [place], [place] [address] [phone] Mon-Fri 8:30-12:30, 1:30-5:00 Providers come to see babies at [facility] Accepting Medicaid [facility] providers who accept newborns: Koirala, MD; Morrow, MD; Wolters, MD [address], [place], [place] [address] [phone] Mon-Fri 8:00-5:30 Babies seen by providers at [facility] Does NOT accept Medicaid Please call early in hospitalization for appointment (limited availability)  [facility] Mulberry, MD [address] [place], [place] [address] [phone] Mon, Tue, Thur, Fri 8:30-5:00, Wed 10:00-7:00 (closed 1-2pm) Babies seen by [facility] providers Accepting Medicaid Rubin - Pediatrician Rubin, MD [address], [place], [place] [address] [phone] Mon-Fri 8:30-5:00, Sat 8:30-12:00 Provider comes to see babies at [facility] Accepting Medicaid Must have been referred from current patients or contacted office prior to delivery [facility] ([facility]) Brown, MD; Chandler, MD; Ettefagh, MD; Grant, MD; Lester, MD; McCormick, MD; McQueen, MD; Prose, MD; Simha, MD; Stanley, MD; Stryffeler, NP; Tebben, NP [address], [place], [place] [address] [phone] Mon, Tue, Thur, Fri 8:30-5:30, Wed 9:30-5:30, Sat 8:30-12:30 Babies seen by [facility] providers Accepting Medicaid Only accepting infants of first-time parents or siblings of current patients Hospital discharge coordinator will make follow-up appointment Jack Amos [address], [place], [place]  [address] [phone]   Fax - [phone] [facility] [address], [place], [place]  [address] Phone - [phone]   Fax - [phone] Shilpa Gosrani [address], [place], [place]  [address] [phone]  East/[place]ortheast [place] ([address]) [facility] Bates, MD; Brassfield, MD; Cooper, Cox, MD; MD; Davis, MD; Dovico, MD; Ettefaugh, MD; Little, MD; Lowe, MD; Keiffer, MD; Melvin, MD; Sumner, MD; Williams, MD [address], [place], [place] [address] [phone] Mon-Fri 8:30-5:00 (extended evenings Mon-Thur as needed), Sat-Sun 10:00-1:00 Providers come to see babies at [facility] Accepting Medicaid for families of first-time babies and families with all children in the household age 3 and under. Must register with office prior to making appointment (M-F only). [facility] Henson, NP; Knapp, MD; Lalonde, MD; Tysinger, PA [address] [place], [place] [address] [phone] Mon-Fri 8:00-5:00 Babies seen by providers at [facility] Does NOT accept Medicaid/Commercial Insurance Only [facility] ([facility])  Artis, MD; Barnes, MD; Bratton, MD; Coccaro, MD; Lockett Gardner, MD; Kramer, MD; Marshall, MD; Netherton, MD; Poleto, MD; Skinner, MD [address] [place], [place] [address] [phone] Mon-Fri 8:30-5:30, Sat (Oct.-Mar.) 9:00-1:00 Babies seen by providers at [facility] Accepting Medicaid  [place]  ([address]) [facility], MD; Warner, MD [address], , [place] [address] [phone] Mon-Fri 8:30-5:00, Sat 8:30-12:00 Providers come to see babies at [facility] Does NOT accept Medicaid [facility] Becker, PA; Hagler, MD; Scifres, PA; Sun, MD; Swayne, MD [address], , [place] [address] [phone] Mon-Fri 8:00-5:00 Babies seen by providers at [facility] Does NOT accept Medicaid Only accepting babies of parents who  are patients Please call early in hospitalization for appointment (limited availability)  Pediatricians Clark, MD; Frye, MD; Kelleher, MD; Mack, NP; Miller, MD; O'Keller, MD; Patterson, NP; Pudlo, MD; Puzio, MD; Thomas, MD; Tucker, MD; Twiselton, MD [address], [place], [place] [address] [phone] Mon-Fri 8:00-5:00, Sat 9:00-12:00 Providers come to see babies at [facility] Does NOT accept Medicaid  [place] ([address]) [facility] providers accepting new patients: Brake, NP; Wharton, PA [address], [place], [place] [address] [phone] Mon-Fri 8:00-5:00 Babies seen by providers at [facility] Does NOT accept Medicaid Only accepting babies of parents who are patients Please call early in hospitalization for appointment (limited availability) [facility] Gay, MD; Quinlan, MD [address] [place], [place] [address] [phone] (press 1 to schedule appointment) Mon-Fri 8:00-5:00 Providers come to see babies at [facility] Does NOT accept Medicaid [facility] Mazer, MD [address] [place], [place] [address] [phone] Mon-Fri 8:30-5:00 (lunch 12:30-1:00), extended hours by appointment only Wed 5:00-6:30 Babies seen by [facility] providers Accepting Medicaid [facility] Banks, MD; Jordan, MD; Koberlein, MD [address], [place], [place] [address] [phone] Mon-Fri 8:00-5:00 Babies seen by [facility] providers Does NOT accept Medicaid [facility] Parker, MD; Hunter, MD; Wallace, DO [address] [place], [place] [address] [phone] Mon-Fri 8:00-5:00 Babies seen by [facility] providers Does NOT accept Medicaid [facility] Brandon, PA; Brecken, PA; Christy, NP; Dees, MD; DeClaire, MD; DeWeese, MD; Hansen, NP; Mills, NP; Parrish, NP; Smoot, NP; Summer, MD; Vapne,  MD [address] [place], [place] [address] [phone] Mon-Fri 8:30-5:00, Sat 10:00-1:00 Providers come to see babies at [facility] Does NOT accept Medicaid Free prenatal information session Tuesdays at 4:45pm [facility] Bouska, MD; [place], PA; Jeffery, PA; Weber, PA [address] [place] [address] [phone] Mon-Fri 7:30-5:30 Babies seen by [facility] providers [facility] [address], [place], [place]  [address] [phone]   Fax - [phone]  [place] ([address] & [address]) [facility] [place] [address] [place], [place] [address] [phone] Mon-Thur 8:00-6:00 Providers come to see babies at [facility] Accepting Medicaid [facility] Anderson, NP; Badger, MD; Beal, PA; Spencer, PA [address] [place], [place] [address] [phone] Mon-Thur 7:30-7:30, Fri 7:30-4:30 Babies seen by [facility] providers Accepting Medicaid [facility] Agbuya, MD; Klett, NP; Romgoolam, MD [address], [place], [place] [address] [phone] Mon-Fri 8:30-5:00, Sat 8:30-12:00 Providers come to see babies at [facility] Accepting Medicaid Must have "Meet & Greet" appointment at office prior to delivery [facility] [place] ([facility] McCord, MD; Wallace, MD; Wood, MD [address], [place], [place] [address] [phone] Mon-Wed 8:00-6:00, Thur-Fri 8:00-5:00, Sat 9:00-12:00 Providers come to see babies at [facility] Does NOT accept Medicaid Only accepting siblings of current patients [facility]  [address], [place], [place]  [address] [phone]   Fax - [phone] [facility] [address], [place], [place]  [address] [phone]   Fax -  [phone]  [place] ([address] & [address]) [facility] Cirigliano, DO; Matthews, DO [address] , [place] [address] [phone] Mon-Fri 7:00-5:00 Babies seen by [facility] providers Does NOT accept Medicaid [facility] Briscoe, MD; Howley, PA; Moreira, PA [address], [place], [place] [address] [phone] Mon-Fri 8:00-5:00 Babies seen by [facility] providers Accepting Medicaid [facility] Boyd, MD; Church, PA; Jones, NP; Osborn, PA [address], , [place] [address] [phone] Mon-Fri 8:00-5:00 Babies seen by providers at [facility] Accepting Medicaid  [place] ([address]) [facility] [place] Wendling, DO [address] [place], [place] [address] [phone] Mon-Fri 8:00-5:00 Babies seen by [facility] providers Does NOT accept Medicaid Limited availability, please call early in hospitalization to schedule follow-up [facility] Calderon, PA; Cummings, MD; Dillard, MD; Martin, PA; Olson, MD; VanDeven, PA [address], [place], [place] [address] [phone] Mon-Fri 8:30-5:00, Sat 9:00-12:00 Babies seen by providers at [facility] Accepting Medicaid Please register online then schedule online or call office [email or web address] Hunter, NP; Kumar, MD; Martin Rogers, PA [address], [place], [place] [address] [phone] Mon-Fri 8:00-5:00 Babies seen by providers at [facility] Accepting Medicaid [facility] ([facility]) Vance, MD; Kristi Fleenor, NP; [place], MD [address], [place], [place] [address] [phone] Mon-Fri 8:00-5:30, Sat&Sun by appointment (phones open at  8:30) Babies seen by [facility] providers Accepting Medicaid Must be a first-time baby or sibling of current patient [facility] [place]  [address], [place], [place]  [address] [phone]   Fax - [phone]  [place] ([address]) [facility] Brown, PA; Cowen, PA; Rice, MD; Helton, PA; Spry, MD [address] [place], [place] [address] [phone] Mon-Thur 8:00-7:00, Fri 8:00-5:00, Sat 8:00-12:00, Sun 9:00-12:00 Babies seen by [facility] providers Accepting Medicaid [facility] Coe-Goins, MD; Marshall, MD; Pierre-Louis, MD [address], [place], [place] [address] [phone] Mon-Thur 8:00-5:00 Babies seen by providers at [facility] Accepting Medicaid [facility] Bratton, MD; Coe-Goins, MD; Hayes, MD; Lewis, MD; List, MD; Lott, MD; Marshall, MD; Moran, MD; O'Neal, MD; Pierre-Louis, MD; Pitonzo, MD; Scholer, MD; Spangle, MD [address] [place], [place] [address] [phone] Mon-Fri 8:00-5:30, Sat (Oct.-Mar.) 9:00-1:00 Babies seen by providers at [facility] Accepting Medicaid Must fill out new patient packet, available online at [email or web address] [facility] ([facility]) Friddle, NP; Harris, NP; Kelly, NP; Logan, MD; Melvin, PA; Poth, MD; Ramadoss, MD; Stanton, NP [address], [place], [place] [address] [phone] Mon-Thur 8:00-5:30, Fri 8:00-5:00 Babies seen by providers at [facility] Accepting Medicaid  [place] ([address]) [facility] Dixon, PA; [place], MD; Pickard, MD; Tapia, PA [address], [place], [place] [address] [phone] Mon-Fri 8:00-5:00 Babies seen by providers at [facility] Accepting Medicaid   [place] ([address]) [facility] [place] Masneri, DO; Meyers, MD; Nelson, PA [address], [place], [place] [address] [phone] Mon-Fri 8:00-5:00 Babies seen by providers at [facility] Does NOT accept Medicaid Limited appointment availability, please call early in hospitalization   [facility] [place] Kunedd, DO; McGowen, MD [address], [place], [place] [address] [phone] Mon-Fri 8:00-5:00 Babies seen by [facility] providers Does NOT accept Medicaid [facility] [place] Cameron, MD; MacDonald, MD; Michaels, PA; Nayak, MD [address] [place] [address], [place], [place] [address] [phone] Mon-Fri 8:00-5:00 After hours clinic ([address] [place], [place] [address]) [phone] Mon-Fri 5:00-8:00, Sat 12:00-6:00, Sun 10:00-4:00 Babies seen by [facility] providers Accepting Medicaid [facility] [place] [address], [place], [place]  [address] [phone]   Fax - [phone]  [place] ([address]) [facility] [place] [facility] Andy, MD [address], [place], [place] [address] [phone] Mon-Fri 8:00-5:00 Babies seen by [facility] providers Does NOT accept Medicaid [facility] [place] ([facility] [place]) Eksir, MD [address], [place], [place] [address] [phone] Mon-Thur 8:00-7:00, Fri 8:00-5:00, Sat 8:00-12:00 Babies seen by providers at [facility] Accepting Medicaid - but does not have vaccinations in office (must be received elsewhere) Limited availability, please call early in hospitalization  [place] ([address]) [facility]  Charlene Flemming, MD [address], [place] [address] [phone]  Fax [phone]  [facility]  Kimberly Newton, MD, Annamarie Streilein, PA, Carla Hampton, PA [address] [place], [place]  [address] [phone] [facility]  [address], [place], [place] [address] [phone] [address], [place], [place] [address] [phone] (West Office)  [facility] [address], [place], [place] [address] [phone] [facility] [address], [place], [place] [address] [phone] [facility] [address], [place], [place] [address] [phone] [facility] [address], [place], [place] [address] [phone] [facility] [address], [place], [place] [address] [phone] [facility] [address], [place], [place] [address] [phone] [facility]  [address], [place], [place] [address] [phone] Dr[facility] [address], [place], [place] [address] [phone] [facility] [address], [place], [place] [address] [phone] [facility] [address], [place],  [address] [phone]  

## 2021-02-05 ENCOUNTER — Ambulatory Visit: Payer: BLUE CROSS/BLUE SHIELD | Attending: Obstetrics

## 2021-02-05 ENCOUNTER — Other Ambulatory Visit: Payer: Self-pay

## 2021-02-05 ENCOUNTER — Other Ambulatory Visit: Payer: Self-pay | Admitting: *Deleted

## 2021-02-05 ENCOUNTER — Ambulatory Visit: Payer: BLUE CROSS/BLUE SHIELD | Admitting: *Deleted

## 2021-02-05 ENCOUNTER — Encounter: Payer: Self-pay | Admitting: *Deleted

## 2021-02-05 VITALS — BP 104/70 | HR 84

## 2021-02-05 DIAGNOSIS — E119 Type 2 diabetes mellitus without complications: Secondary | ICD-10-CM | POA: Diagnosis not present

## 2021-02-05 DIAGNOSIS — Z3A31 31 weeks gestation of pregnancy: Secondary | ICD-10-CM

## 2021-02-05 DIAGNOSIS — O24113 Pre-existing diabetes mellitus, type 2, in pregnancy, third trimester: Secondary | ICD-10-CM

## 2021-02-05 DIAGNOSIS — Z6841 Body Mass Index (BMI) 40.0 and over, adult: Secondary | ICD-10-CM

## 2021-02-05 DIAGNOSIS — O24414 Gestational diabetes mellitus in pregnancy, insulin controlled: Secondary | ICD-10-CM | POA: Diagnosis present

## 2021-02-05 DIAGNOSIS — O99210 Obesity complicating pregnancy, unspecified trimester: Secondary | ICD-10-CM | POA: Insufficient documentation

## 2021-02-12 ENCOUNTER — Ambulatory Visit: Payer: BLUE CROSS/BLUE SHIELD | Attending: Obstetrics

## 2021-02-12 ENCOUNTER — Encounter: Payer: Self-pay | Admitting: *Deleted

## 2021-02-12 ENCOUNTER — Ambulatory Visit: Payer: BLUE CROSS/BLUE SHIELD | Admitting: *Deleted

## 2021-02-12 ENCOUNTER — Other Ambulatory Visit: Payer: Self-pay

## 2021-02-12 VITALS — BP 115/71 | HR 105

## 2021-02-12 DIAGNOSIS — E119 Type 2 diabetes mellitus without complications: Secondary | ICD-10-CM | POA: Diagnosis not present

## 2021-02-12 DIAGNOSIS — E669 Obesity, unspecified: Secondary | ICD-10-CM

## 2021-02-12 DIAGNOSIS — O99210 Obesity complicating pregnancy, unspecified trimester: Secondary | ICD-10-CM | POA: Diagnosis present

## 2021-02-12 DIAGNOSIS — O99213 Obesity complicating pregnancy, third trimester: Secondary | ICD-10-CM | POA: Diagnosis not present

## 2021-02-12 DIAGNOSIS — Z6841 Body Mass Index (BMI) 40.0 and over, adult: Secondary | ICD-10-CM | POA: Insufficient documentation

## 2021-02-12 DIAGNOSIS — O24113 Pre-existing diabetes mellitus, type 2, in pregnancy, third trimester: Secondary | ICD-10-CM | POA: Diagnosis not present

## 2021-02-12 DIAGNOSIS — Z3A32 32 weeks gestation of pregnancy: Secondary | ICD-10-CM

## 2021-02-16 ENCOUNTER — Encounter: Payer: Self-pay | Admitting: *Deleted

## 2021-02-16 ENCOUNTER — Other Ambulatory Visit: Payer: Self-pay

## 2021-02-16 ENCOUNTER — Ambulatory Visit: Payer: BLUE CROSS/BLUE SHIELD | Attending: Obstetrics

## 2021-02-16 ENCOUNTER — Ambulatory Visit: Payer: BLUE CROSS/BLUE SHIELD | Admitting: *Deleted

## 2021-02-16 VITALS — BP 125/70 | HR 103

## 2021-02-16 DIAGNOSIS — O99210 Obesity complicating pregnancy, unspecified trimester: Secondary | ICD-10-CM | POA: Insufficient documentation

## 2021-02-16 DIAGNOSIS — Z6841 Body Mass Index (BMI) 40.0 and over, adult: Secondary | ICD-10-CM | POA: Insufficient documentation

## 2021-02-16 DIAGNOSIS — O99213 Obesity complicating pregnancy, third trimester: Secondary | ICD-10-CM

## 2021-02-16 DIAGNOSIS — E669 Obesity, unspecified: Secondary | ICD-10-CM

## 2021-02-16 DIAGNOSIS — O24113 Pre-existing diabetes mellitus, type 2, in pregnancy, third trimester: Secondary | ICD-10-CM | POA: Insufficient documentation

## 2021-02-16 DIAGNOSIS — E119 Type 2 diabetes mellitus without complications: Secondary | ICD-10-CM

## 2021-02-16 DIAGNOSIS — Z3A33 33 weeks gestation of pregnancy: Secondary | ICD-10-CM

## 2021-02-20 ENCOUNTER — Other Ambulatory Visit: Payer: Self-pay | Admitting: Obstetrics and Gynecology

## 2021-02-24 ENCOUNTER — Telehealth: Payer: Self-pay

## 2021-02-24 NOTE — Telephone Encounter (Signed)
Pt called stating that she is having some soreness in her vaginal region near the top of her thighs. When I started asking more questions, I asked patient if it was near the area where her legs and pelvis meet and patient stated yes. Advised patient that those are her round ligaments. Advised patient she could try tylenol, indirect heat, and a belly band to help with weight distribution. Pt agreed and verbalized understanding.

## 2021-02-26 ENCOUNTER — Other Ambulatory Visit: Payer: BLUE CROSS/BLUE SHIELD

## 2021-03-03 NOTE — Progress Notes (Deleted)
Office Visit Note  Patient: Rachael Patton             Date of Birth: [birth year]           MRN: [id number]             PCP: Inda Coke, PA Referring: Inda Coke, PA Visit Date: 03/04/2021   Subjective:  No chief complaint on file.   History of Present Illness: Delesia Patton is a 20 y.o. 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 She is currently at about 35 weeks pregnancy. Pediatric cardiology review of ultrasound with [facility] was unremarkable checked associated with SSA Abs. ***   Previous HPI 12/02/20 Rachael Patton is a 20 y.o. female here for follow up for polymyositis on azathioprine 50 mg PO daily and prednisone 10 mg PO daily. She was started on metformin for worsening of hyperglycemia. She did not return for labs after past visit so remained on current medication dose. She feels symptoms are doing well. Right knee area remains numb in a small distribution unchanged since her biopsy.   Previous HPI: 09/01/20 Rachael Patton is a 20 y.o. 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 She was seen last visit with CK elevation noted again with prednisone taper and labs were obtained regarding additional DMARD treatment. She feels about the same as before still has fatigue but no swelling, no rashes, no weakness or myalgias. At last visit TPMT phenotype was checked with normal enzyme function.   Previous HPI 05/08/20 Rachael Patton is a 20 y.o. female with history of hypothyroidism, elevated prolactin here for follow up of hospitalization last week with proximal muscle weakness found to have highly elevated CK and myoglobinuria. This was treated with IV fluids and started steroid treatment with improvement in CK from 11,796 to 4,047. She was discharged on prednisone 38m per day dose. Prior to this she was ill with COVID a month ago. She was having some type of GI illness in  December not clear if this was related to COVID infection or separate process. Workup with labs on 1/6 and 1/7 showing transaminitis also positive Ab tests for COVID19 and Hepatitis A. Since the hospital visit she feels her symptoms are partially improved. She notices continued leg swelling and has some dyspnea with lying supine and on exertion. Skin rash on the face is slightly less warm and severe than before. She thinks strength is slightly better but not at her baseline at all.   Labs reviewed 05/05/2020 CK 4,047 K 3.1 Ca 8.2 WBC 14.9 Hgb 9.9   05/01/2020 HIV neg CK 11,796 Large myoglobinuria TSH 3.741 ANA neg RF neg ESR 60   Imaging reviewed 05/01/2020 CXR No acute cardiopulmonary process   RUQ [place] hepatic steatosis, no gallbladder disease  No Rheumatology ROS completed.   PMFS History:  Patient Active Problem List   Diagnosis Date Noted   Insulin controlled gestational diabetes mellitus (GDM) in third trimester 01/07/2021   SS-A antibody positive 11/26/2020   Maternal obesity affecting pregnancy, antepartum 11/18/2020   Supervision of high risk pregnancy, antepartum 10/21/2020   Polymyositis ([facility] 07/18/2020   ILD (interstitial lung disease) ([place] 06/25/2020   Myositis associated antibody positive 06/23/2020   High risk medication use 05/08/2020   Morbid obesity ([place] 04/30/2020   Prolactin increased 04/30/2020   Hypothyroidism 04/30/2020   Prediabetes 04/30/2020   Iron deficiency anemia 04/30/2020  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    Past Medical History:  Diagnosis Date   Anemia    Asthma    As a child   Myositis    Pre-diabetes    Prediabetes    Rhabdomyolysis    Thyroid disease    found at age 71    Family History  Problem Relation Age of Onset   Diabetes Mother    Diabetes Maternal Grandmother    Diabetes Paternal Grandmother    Stroke Paternal Grandmother    Stroke Paternal Grandfather    Diabetes Paternal Grandfather    Multiple  sclerosis Cousin    Lupus Cousin    Past Surgical History:  Procedure Laterality Date   FOOT SURGERY Left 06/2019   MUSCLE BIOPSY Right 05/14/2020   Procedure: RIGHT THIGH MUSCLE BIOPSY;  Surgeon: Dwan Bolt, MD;  Location: [place];  Service: General;  Laterality: Right;  ROOM 3 STARTING AT 04:00PM FOR 45MIN   Social History   Social History Narrative   Graduated from [employer] in sociology/toxicology   Starting day care job soon   Lives with boyfriend   No children   Immunization History  Administered Date(s) Administered   HPV 9-valent 01/31/2020   PFIZER(Purple Top)SARS-COV-2 Vaccination 04/17/2020     Objective: Vital Signs: LMP 07/03/2020 (LMP Unknown)    Physical Exam   Musculoskeletal Exam: ***  CDAI Exam: CDAI Score: -- Patient Global: --; Provider Global: -- Swollen: --; Tender: -- Joint Exam 03/04/2021   No joint exam has been documented for this visit   There is currently no information documented on the homunculus. Go to the Rheumatology activity and complete the homunculus joint exam.  Investigation: No additional findings.  Imaging: [place] MFM FETAL BPP WO NON STRESS  Result Date: 02/16/2021 ----------------------------------------------------------------------  OBSTETRICS REPORT                       (Signed Final 02/16/2021 08:45 am) ---------------------------------------------------------------------- Patient Info  ID #:       [id number]                          D.O.B.:  2001/03/17 (20 yrs)  Name:       Rachael Patton                  Visit Date: 02/16/2021 07:43 am ---------------------------------------------------------------------- Performed By  Attending:        Tama High MD        Ref. Address:     [place]  [place]                                                              [address]  Performed By:     Jacob Moores BS,       [facility]                    RDMS, RVT                                [facility] at                                                             [place] for                                                             Women  Referred By:      [facility] ---------------------------------------------------------------------- Orders  #  Description                           Code        Ordered By  1  [place] MFM FETAL BPP WO NON               76819.01    YU FANG     STRESS ----------------------------------------------------------------------  #  Order #                     Accession #                Episode #  1  [id number]                   [id number]                 [id number] ---------------------------------------------------------------------- Indications  [redacted] weeks gestation of pregnancy                Z3A.33  Diabetes - Pregestational,3rd trimester        O24.313  (Metformin)  Medical complication of pregnancy (Myositis)   O26.90  - followed by rheumotology  Obesity complicating pregnancy, third          O99.213  trimester  Negative AFP  Hypothyroid                                    O99.280 I69.6  Medical complication of pregnancy (+SSA        O26.90  antibodies) ---------------------------------------------------------------------- Vital Signs  Height:        5'5" ---------------------------------------------------------------------- Fetal Evaluation  Num Of Fetuses:         1  Fetal Heart Rate(bpm):  148  Cardiac Activity:       Observed  Presentation:           Cephalic  Placenta:               Anterior  P. Cord Insertion:      Previously Visualized  Amniotic Fluid  AFI FV:      Within normal limits  AFI Sum(cm)     %Tile       Largest Pocket(cm)  14.5            51          4  RUQ(cm)       RLQ(cm)       LUQ(cm)        LLQ(cm)  4             3.9           3.8             2.8 ---------------------------------------------------------------------- Biophysical Evaluation  Amniotic F.V:   Pocket => 2 cm             F. Tone:        Observed  F. Movement:    Observed                   Score:          8/8  F. Breathing:   Observed ---------------------------------------------------------------------- OB History  Gravidity:    1 ---------------------------------------------------------------------- Gestational Age  LMP:           32w 4d        Date:  07/03/20                 EDD:   04/09/21  Best:          33w 1d     Det. By:  Previous Ultrasound      EDD:   04/05/21 ---------------------------------------------------------------------- Anatomy  Ventricles:            Appears normal         Stomach:                Appears normal, left                                                                        sided  Thoracic:              Appears normal         Kidneys:                Appear normal  Heart:                 Appears normal         Bladder:                Appears normal                         (4CH, axis, and  situs)  Diaphragm:             Appears normal ---------------------------------------------------------------------- Cervix Uterus Adnexa  Cervix  Not visualized (advanced GA >24wks)  Uterus  No abnormality visualized.  Right Ovary  Within normal limits.  Left Ovary  Within normal limits.  Cul De Sac  No free fluid seen.  Adnexa  No abnormality visualized. ---------------------------------------------------------------------- Impression  Patient with type 2 diabetes returned for antenatal testing.  She reports she takes insulin NPH 20 units at night and 10  units in the morning.  Both her fasting and postprandial levels  are reportedly within normal range.  She takes metformin  1000 mg twice daily.  Blood pressure today at her office is 125/70 mmHg.  Amniotic fluid is normal and good fetal activity seen.  Antenatal testing is reassuring.  BPP 8/8.  I  reassured the patient of the findings. ---------------------------------------------------------------------- Recommendations  -Patient has an appointment for BPP at your office next week.  -Fetal growth in 2 weeks.  -We made appointments for weekly BPP. ----------------------------------------------------------------------                  Tama High, MD Electronically Signed Final Report   02/16/2021 08:45 am ----------------------------------------------------------------------  [place] MFM FETAL BPP WO NON STRESS  Result Date: 02/12/2021 ----------------------------------------------------------------------  OBSTETRICS REPORT                       (Signed Final 02/12/2021 04:13 pm) ---------------------------------------------------------------------- Patient Info  ID #:       [id number]                          D.O.B.:  [birth year] (20 yrs)  Name:       Rachael Patton                  Visit Date: 02/12/2021 03:36 pm ---------------------------------------------------------------------- Performed By  Attending:        Johnell Comings MD         Ref. Address:     [place]  [address]  Performed By:     Lelan Pons RDMS       Location[facility]         [facility]                                                             [facility] at                                                             [place] for                                                             Women  Referred By:      [place] ---------------------------------------------------------------------- Orders  #  Description                           Code        Ordered By  1  [place] MFM FETAL BPP WO NON               76819.01    YU FANG     STRESS  ----------------------------------------------------------------------  #  Order #                     Accession #                Episode #  1  [id number]                   [id number]                 [id number] ---------------------------------------------------------------------- Indications  Diabetes - Pregestational,3rd trimester        O24.313  (Metformin)  Medical complication of pregnancy (Myositis)   O26.90  - followed by rheumotology  Obesity complicating pregnancy, third          O99.213  trimester  Negative AFP  Hypothyroid                                    O99.280 Z85.8  Medical complication of pregnancy (+SSA        O26.90  antibodies)  [redacted] weeks gestation of pregnancy                Z3A.32 ---------------------------------------------------------------------- Vital Signs                                                 Height:        5'5" ---------------------------------------------------------------------- Fetal Evaluation  Num Of Fetuses:         1  Fetal Heart Rate(bpm):  144  Cardiac Activity:  Observed  Presentation:           Cephalic  Placenta:               Anterior  P. Cord Insertion:      Previously Visualized  Amniotic Fluid  AFI FV:      Within normal limits  AFI Sum(cm)     %Tile       Largest Pocket(cm)  15.55           55          5.19  RUQ(cm)       RLQ(cm)       LUQ(cm)        LLQ(cm)  5.19          1.75          4.49           4.12 ---------------------------------------------------------------------- Biophysical Evaluation  Amniotic F.V:   Within normal limits       F. Tone:        Observed  F. Movement:    Observed                   Score:          8/8  F. Breathing:   Observed ---------------------------------------------------------------------- Biometry  LV:        2.3  mm ---------------------------------------------------------------------- OB History  Gravidity:    1 ---------------------------------------------------------------------- Gestational Age  LMP:           32w 0d         Date:  07/03/20                 EDD:   04/09/21  Best:          Milderd Meager 4d     Det. By:  Previous Ultrasound      EDD:   04/05/21 ---------------------------------------------------------------------- Anatomy  Ventricles:            Appears normal         Kidneys:                Appear normal  Heart:                 Appears normal         Bladder:                Appears normal                         (4CH, axis, and                         situs)  Stomach:               Appears normal, left                         sided ---------------------------------------------------------------------- Cervix Uterus Adnexa  Cervix  Not visualized (advanced GA >24wks)  Right Ovary  Visualized.  Left Ovary  Visualized. ---------------------------------------------------------------------- Comments  This patient was seen for a BPP due to pregestational  diabetes that is treated with metformin/insulin and maternal  obesity.  She denies any problems since her last exam.  A biophysical profile performed today was 8 out of 8.  There was normal amniotic fluid noted today.  Another BPP was scheduled in 1 week. ----------------------------------------------------------------------  Johnell Comings, MD Electronically Signed Final Report   02/12/2021 04:13 pm ----------------------------------------------------------------------  [place] MFM FETAL BPP WO NON STRESS  Result Date: 02/05/2021 ----------------------------------------------------------------------  OBSTETRICS REPORT                       (Signed Final 02/05/2021 04:34 pm) ---------------------------------------------------------------------- Patient Info  ID #:       [id number]                          D.O.B.:  05-30-2000 (20 yrs)  Name:       Rachael Patton                  Visit Date: 02/05/2021 03:28 pm ---------------------------------------------------------------------- Performed By  Attending:        Johnell Comings MD         Ref. Address:     [address]                                                             [address] [place]                                                             [place]  Performed By:     Lelan Pons RDMS       Location:         [facility]                                                             [facility] at                                                             [place] for  Women  Referred By:      [place] ---------------------------------------------------------------------- Orders  #  Description                           Code        Ordered By  1  [place] MFM OB FOLLOW UP                   [phone]    YU FANG  2  [place] MFM FETAL BPP WO NON               76819.01    YU FANG     STRESS ----------------------------------------------------------------------  #  Order #                     Accession #                Episode #  1  [id number]                   [id number]                 [id number]  2  [id number]                   [id number]                 [id number] ---------------------------------------------------------------------- Indications  Diabetes - Pregestational,3rd trimester        O24.313  (Metformin)  Medical complication of pregnancy (Myositis)   O26.90  - followed by rheumotology  Obesity complicating pregnancy, third          O99.213  trimester  Negative AFP  Hypothyroid                                    O99.280 P91.5  Medical complication of pregnancy (+SSA        O26.90  antibodies)  [redacted] weeks gestation of pregnancy                Z3A.31 ---------------------------------------------------------------------- Vital Signs                                                 Height:        5'5" ---------------------------------------------------------------------- Fetal Evaluation  Num Of Fetuses:         1  Fetal Heart Rate(bpm):  138   Cardiac Activity:       Observed  Presentation:           Cephalic  Placenta:               Anterior  P. Cord Insertion:      Previously Visualized  Amniotic Fluid  AFI FV:      Within normal limits  AFI Sum(cm)     %Tile       Largest Pocket(cm)  16.6            60          5.6  RUQ(cm)       RLQ(cm)       LUQ(cm)        LLQ(cm)  5.6           3.4  5.3            2.2 ---------------------------------------------------------------------- Biophysical Evaluation  Amniotic F.V:   Within normal limits       F. Tone:        Observed  F. Movement:    Observed                   Score:          8/8  F. Breathing:   Observed ---------------------------------------------------------------------- Biometry  BPD:      81.1  mm     G. Age:  32w 4d         71  %    CI:        72.27   %    70 - 86                                                          FL/HC:      20.2   %    19.1 - 21.3  HC:      303.5  mm     G. Age:  33w 5d         72  %    HC/AC:      1.16        0.96 - 1.17  AC:      262.3  mm     G. Age:  30w 3d         16  %    FL/BPD:     75.6   %    71 - 87  FL:       61.3  mm     G. Age:  31w 6d         43  %    FL/AC:      23.4   %    20 - 24  Est. FW:    1745  gm    3 lb 14 oz      31  % ---------------------------------------------------------------------- OB History  Gravidity:    1 ---------------------------------------------------------------------- Gestational Age  LMP:           31w 0d        Date:  07/03/20                 EDD:   04/09/21  U/S Today:     32w 1d                                        EDD:   04/01/21  Best:          31w 4d     Det. By:  Previous Ultrasound      EDD:   04/05/21 ---------------------------------------------------------------------- Anatomy  Cranium:               Appears normal         Aortic Arch:            Previously seen  Cavum:                 Previously seen        Ductal Arch:  Previously seen  Ventricles:            Appears normal         Diaphragm:               Previously seen  Choroid Plexus:        Previously seen        Stomach:                Appears normal, left                                                                        sided  Cerebellum:            Previously seen        Abdomen:                Previously seen  Posterior Fossa:       Previously seen        Abdominal Wall:         Previously seen  Nuchal Fold:           Previously seen        Cord Vessels:           Previously seen  Face:                  Orbits and profile     Kidneys:                Appear normal                         previously seen  Lips:                  Previously seen        Bladder:                Appears normal  Thoracic:              Previously seen        Spine:                  Previously seen  Heart:                 Appears normal         Upper Extremities:      Previously seen                         (4CH, axis, and                         situs)  RVOT:                  Previously seen        Lower Extremities:      Previously seen  LVOT:                  Previously seen  Other:  Female gender previously seen. Technically difficult due to maternal          habitus and fetal position. ---------------------------------------------------------------------- Cervix Uterus Adnexa  Cervix  Not visualized (advanced  GA >24wks)  Right Ovary  Not visualized.  Left Ovary  Visualized. 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  She denies any problems since her  last exam.  She was informed that the fetal growth and amniotic fluid  level appears appropriate for her gestational age.  A biophysical profile performed today was 8 out of 8.  Another BPP was scheduled in 1 week. ----------------------------------------------------------------------                   Johnell Comings, MD Electronically Signed  Final Report   02/05/2021 04:34 pm ----------------------------------------------------------------------  [place] MFM OB FOLLOW UP  Result Date: 02/05/2021 ----------------------------------------------------------------------  OBSTETRICS REPORT                       (Signed Final 02/05/2021 04:34 pm) ---------------------------------------------------------------------- Patient Info  ID #:       [id number]                          D.O.B.:  February 23, 2001 (20 yrs)  Name:       Rachael Patton                  Visit Date: 02/05/2021 03:28 pm ---------------------------------------------------------------------- Performed By  Attending:        Johnell Comings MD         Ref. Address:     [place]                                                             [place]  Performed By:     Lelan Pons RDMS       [facility]         [facility]                                                             [place] for                                                             Women  Referred By:      [place] ---------------------------------------------------------------------- Orders  #  Description                           Code        Ordered By  1  [place] MFM OB FOLLOW UP                   76816.01    YU FANG  2  [place] MFM FETAL BPP WO NON               76819.01    YU FANG     STRESS ----------------------------------------------------------------------  #  Order #                     Accession #                Episode #  1  [id number]                   [id number]                 [id number]  2  [id number]                   [id number]                 [id number] ---------------------------------------------------------------------- Indications  Diabetes -  Pregestational,3rd trimester        O24.313  (Metformin)  Medical complication of pregnancy (Myositis)   O26.90  - followed by rheumotology  Obesity complicating pregnancy, third          O99.213  trimester  Negative AFP  Hypothyroid                                    O99.280 X10.6  Medical complication of pregnancy (+SSA        O26.90  antibodies)  [redacted] weeks gestation of pregnancy                Z3A.31 ---------------------------------------------------------------------- Vital Signs                                                 Height:        5'5" ---------------------------------------------------------------------- Fetal Evaluation  Num Of Fetuses:         1  Fetal Heart Rate(bpm):  138  Cardiac Activity:       Observed  Presentation:  Cephalic  Placenta:               Anterior  P. Cord Insertion:      Previously Visualized  Amniotic Fluid  AFI FV:      Within normal limits  AFI Sum(cm)     %Tile       Largest Pocket(cm)  16.6            60          5.6  RUQ(cm)       RLQ(cm)       LUQ(cm)        LLQ(cm)  5.6           3.4           5.3            2.2 ---------------------------------------------------------------------- Biophysical Evaluation  Amniotic F.V:   Within normal limits       F. Tone:        Observed  F. Movement:    Observed                   Score:          8/8  F. Breathing:   Observed ---------------------------------------------------------------------- Biometry  BPD:      81.1  mm     G. Age:  32w 4d         71  %    CI:        72.27   %    70 - 86                                                          FL/HC:      20.2   %    19.1 - 21.3  HC:      303.5  mm     G. Age:  33w 5d         72  %    HC/AC:      1.16        0.96 - 1.17  AC:      262.3  mm     G. Age:  30w 3d         16  %    FL/BPD:     75.6   %    71 - 87  FL:       61.3  mm     G. Age:  31w 6d         43  %    FL/AC:      23.4   %    20 - 24  Est. FW:    1745  gm    3 lb 14 oz      31  %  ---------------------------------------------------------------------- OB History  Gravidity:    1 ---------------------------------------------------------------------- Gestational Age  LMP:           31w 0d        Date:  07/03/20                 EDD:   04/09/21  U/S Today:     32w 1d  EDD:   04/01/21  Best:          31w 4d     Det. By:  Previous Ultrasound      EDD:   04/05/21 ---------------------------------------------------------------------- Anatomy  Cranium:               Appears normal         Aortic Arch:            Previously seen  Cavum:                 Previously seen        Ductal Arch:            Previously seen  Ventricles:            Appears normal         Diaphragm:              Previously seen  Choroid Plexus:        Previously seen        Stomach:                Appears normal, left                                                                        sided  Cerebellum:            Previously seen        Abdomen:                Previously seen  Posterior Fossa:       Previously seen        Abdominal Wall:         Previously seen  Nuchal Fold:           Previously seen        Cord Vessels:           Previously seen  Face:                  Orbits and profile     Kidneys:                Appear normal                         previously seen  Lips:                  Previously seen        Bladder:                Appears normal  Thoracic:              Previously seen        Spine:                  Previously seen  Heart:                 Appears normal         Upper Extremities:      Previously seen                         (4CH, axis, and  situs)  RVOT:                  Previously seen        Lower Extremities:      Previously seen  LVOT:                  Previously seen  Other:  Female gender previously seen. Technically difficult due to maternal          habitus and fetal position.  ---------------------------------------------------------------------- Cervix Uterus Adnexa  Cervix  Not visualized (advanced GA >24wks)  Right Ovary  Not visualized.  Left Ovary  Visualized. 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  She denies any problems since her  last exam.  She was informed that the fetal growth and amniotic fluid  level appears appropriate for her gestational age.  A biophysical profile performed today was 8 out of 8.  Another BPP was scheduled in 1 week. ----------------------------------------------------------------------                   Johnell Comings, MD Electronically Signed Final Report   02/05/2021 04:34 pm ----------------------------------------------------------------------   Recent Labs: Lab Results  Component Value Date   WBC 10.0 01/05/2021   HGB 11.8 01/05/2021   PLT 278 01/05/2021   NA 136 12/02/2020   K 4.0 12/02/2020   CL 103 12/02/2020   CO2 25 12/02/2020   GLUCOSE 98 12/02/2020   BUN 7 12/02/2020   CREATININE 0.55 12/02/2020   BILITOT 0.3 12/02/2020   ALKPHOS 38 05/04/2020   AST 26 12/02/2020   ALT 29 12/02/2020   PROT 7.3 12/02/2020   ALBUMIN 2.5 (L) 05/04/2020   CALCIUM 9.4 12/02/2020   GFRAA 153 07/30/2020    Speciality Comments: No specialty comments available.  Procedures:  No procedures performed Allergies: Patient has no known allergies.   Assessment / Plan:     Visit Diagnoses: No diagnosis found.  ***  Orders: No orders of the defined types were placed in this encounter.  No orders of the defined types were placed in this encounter.    Follow-Up Instructions: No follow-ups on file.   Collier Salina, MD  Note - This record has been created using [employer].  Chart creation errors have been sought, but may not always  have been  located. Such creation errors do not reflect on  the standard of medical care.

## 2021-03-04 ENCOUNTER — Ambulatory Visit: Payer: BLUE CROSS/BLUE SHIELD | Admitting: Internal Medicine

## 2021-03-05 ENCOUNTER — Other Ambulatory Visit: Payer: Self-pay

## 2021-03-05 ENCOUNTER — Encounter: Payer: Self-pay | Admitting: *Deleted

## 2021-03-05 ENCOUNTER — Ambulatory Visit: Payer: BLUE CROSS/BLUE SHIELD | Admitting: *Deleted

## 2021-03-05 ENCOUNTER — Ambulatory Visit: Payer: BLUE CROSS/BLUE SHIELD | Attending: Obstetrics

## 2021-03-05 VITALS — BP 117/68 | HR 72

## 2021-03-05 DIAGNOSIS — E669 Obesity, unspecified: Secondary | ICD-10-CM | POA: Diagnosis not present

## 2021-03-05 DIAGNOSIS — Z6841 Body Mass Index (BMI) 40.0 and over, adult: Secondary | ICD-10-CM

## 2021-03-05 DIAGNOSIS — O99210 Obesity complicating pregnancy, unspecified trimester: Secondary | ICD-10-CM

## 2021-03-05 DIAGNOSIS — E119 Type 2 diabetes mellitus without complications: Secondary | ICD-10-CM | POA: Diagnosis not present

## 2021-03-05 DIAGNOSIS — O99213 Obesity complicating pregnancy, third trimester: Secondary | ICD-10-CM

## 2021-03-05 DIAGNOSIS — Z3A35 35 weeks gestation of pregnancy: Secondary | ICD-10-CM

## 2021-03-05 DIAGNOSIS — O24113 Pre-existing diabetes mellitus, type 2, in pregnancy, third trimester: Secondary | ICD-10-CM

## 2021-03-06 ENCOUNTER — Other Ambulatory Visit (HOSPITAL_COMMUNITY)
Admission: RE | Admit: 2021-03-06 | Discharge: 2021-03-06 | Disposition: A | Discharge status: Home / Self Care | Payer: BLUE CROSS/BLUE SHIELD | Source: Ambulatory Visit | Attending: Obstetrics and Gynecology | Admitting: Obstetrics and Gynecology

## 2021-03-06 ENCOUNTER — Encounter: Payer: Self-pay | Admitting: Obstetrics and Gynecology

## 2021-03-06 ENCOUNTER — Ambulatory Visit (INDEPENDENT_AMBULATORY_CARE_PROVIDER_SITE_OTHER): Payer: BLUE CROSS/BLUE SHIELD | Admitting: Obstetrics and Gynecology

## 2021-03-06 VITALS — BP 108/74 | HR 92 | Wt 267.1 lb

## 2021-03-06 DIAGNOSIS — O0990 Supervision of high risk pregnancy, unspecified, unspecified trimester: Secondary | ICD-10-CM | POA: Diagnosis present

## 2021-03-06 DIAGNOSIS — R768 Other specified abnormal immunological findings in serum: Secondary | ICD-10-CM

## 2021-03-06 DIAGNOSIS — Z3A35 35 weeks gestation of pregnancy: Secondary | ICD-10-CM | POA: Insufficient documentation

## 2021-03-06 DIAGNOSIS — O24414 Gestational diabetes mellitus in pregnancy, insulin controlled: Secondary | ICD-10-CM

## 2021-03-06 MED ORDER — ONETOUCH VERIO W/DEVICE KIT: 1.0000 | PACK | Freq: Four times a day (QID) | 0 refills | Status: DC

## 2021-03-06 NOTE — Progress Notes (Signed)
Pt presents for ROB and GBS labs reports no complaints.  CBG readings available  Pt lost her glucose meter. She needs another one.

## 2021-03-06 NOTE — Progress Notes (Signed)
   PRENATAL VISIT NOTE  Subjective:  Rachael Patton is a 20 y.o. G1P0 at [redacted]w[redacted]d being seen today for ongoing prenatal care.  She is currently monitored for the following issues for this high-risk pregnancy and has Morbid obesity ([place]); Prolactin increased; Hypothyroidism; Prediabetes; Iron deficiency anemia; Tarsal coalition of left foot; High risk medication use; Myositis associated antibody positive; ILD (interstitial lung disease) ([place]); Polymyositis ([place]); Supervision of high risk pregnancy, antepartum; Maternal obesity affecting pregnancy, antepartum; SS-A antibody positive; Insulin controlled gestational diabetes mellitus (GDM) in third trimester; and [redacted] weeks gestation of pregnancy on their problem list.  Patient doing well with no acute concerns today. She reports no complaints.  Contractions: Not present. Vag. Bleeding: None.  Movement: Present. Denies leaking of fluid.   The following portions of the patient's history were reviewed and updated as appropriate: allergies, current medications, past family history, past medical history, past social history, past surgical history and problem list. Problem list updated.  Objective:   Vitals:   03/06/21 0843  BP: 108/74  Pulse: 92  Weight: 267 lb 1.6 oz (121.2 kg)    Fetal Status: Fetal Heart Rate (bpm): 141 Fundal Height: 36 cm Movement: Present     General:  Alert, oriented and cooperative. Patient is in no acute distress.  Skin: Skin is warm and dry. No rash noted.   Cardiovascular: Normal heart rate noted  Respiratory: Normal respiratory effort, no problems with respiration noted  Abdomen: Soft, gravid, appropriate for gestational age.  Pain/Pressure: Absent     Pelvic: Cervical exam deferred Dilation: 1 Effacement (%): 70 Station: -2  Extremities: Normal range of motion.  Edema: Trace  Mental Status:  Normal mood and affect. Normal behavior. Normal judgment and thought content.   Assessment and Plan:  Pregnancy: G1P0 at  [redacted]w[redacted]d  1. Supervision of high risk pregnancy, antepartum Continue routine care - Strep Gp B NAA - Cervicovaginal ancillary only( [place])  2. Insulin controlled gestational diabetes mellitus (GDM) in third trimester Moderate blood sugar control  FBS: 95-115 PPBS: 111-153 Most out of range, per MFM delivery at 37 weeks IOL scheduled  - Blood Glucose Monitoring Suppl (ONETOUCH VERIO) w/Device KIT; 1 Device by Does not apply route in the morning, at noon, in the evening, and at bedtime.  Dispense: 1 kit; Refill: 0  3. [redacted] weeks gestation of pregnancy   4. Morbid obesity ([place])   5. SS-A antibody positive   Preterm labor symptoms and general obstetric precautions including but not limited to vaginal bleeding, contractions, leaking of fluid and fetal movement were reviewed in detail with the patient.  Please refer to After Visit Summary for other counseling recommendations.   Return in about 1 week (around 03/13/2021) for [facility], in person.   Lynnda Shields, MD Faculty Attending Center for [facility]

## 2021-03-08 LAB — STREP GP B NAA: Strep Gp B NAA: POSITIVE — AB

## 2021-03-09 ENCOUNTER — Ambulatory Visit (INDEPENDENT_AMBULATORY_CARE_PROVIDER_SITE_OTHER): Payer: BLUE CROSS/BLUE SHIELD

## 2021-03-09 ENCOUNTER — Ambulatory Visit: Payer: BLUE CROSS/BLUE SHIELD | Admitting: *Deleted

## 2021-03-09 ENCOUNTER — Other Ambulatory Visit: Payer: Self-pay

## 2021-03-09 DIAGNOSIS — O24414 Gestational diabetes mellitus in pregnancy, insulin controlled: Secondary | ICD-10-CM

## 2021-03-09 DIAGNOSIS — O99210 Obesity complicating pregnancy, unspecified trimester: Secondary | ICD-10-CM

## 2021-03-09 LAB — CERVICOVAGINAL ANCILLARY ONLY
Chlamydia: NEGATIVE
Comment: NEGATIVE
Comment: NORMAL
Neisseria Gonorrhea: NEGATIVE

## 2021-03-11 ENCOUNTER — Other Ambulatory Visit: Payer: Self-pay | Admitting: Advanced Practice Midwife

## 2021-03-15 ENCOUNTER — Inpatient Hospital Stay (HOSPITAL_COMMUNITY)
Admission: AD | Admit: 2021-03-15 | Discharge: 2021-03-18 | DRG: 806 | Disposition: A | Discharge status: Home / Self Care | Payer: BLUE CROSS/BLUE SHIELD | Attending: Obstetrics & Gynecology | Admitting: Obstetrics & Gynecology

## 2021-03-15 ENCOUNTER — Inpatient Hospital Stay (HOSPITAL_COMMUNITY): Payer: BLUE CROSS/BLUE SHIELD | Attending: Obstetrics and Gynecology

## 2021-03-15 ENCOUNTER — Other Ambulatory Visit: Payer: Self-pay

## 2021-03-15 ENCOUNTER — Encounter (HOSPITAL_COMMUNITY): Payer: Self-pay | Admitting: Obstetrics and Gynecology

## 2021-03-15 DIAGNOSIS — E119 Type 2 diabetes mellitus without complications: Secondary | ICD-10-CM | POA: Diagnosis present

## 2021-03-15 DIAGNOSIS — Z20822 Contact with and (suspected) exposure to covid-19: Secondary | ICD-10-CM | POA: Diagnosis present

## 2021-03-15 DIAGNOSIS — O134 Gestational [pregnancy-induced] hypertension without significant proteinuria, complicating childbirth: Secondary | ICD-10-CM | POA: Diagnosis present

## 2021-03-15 DIAGNOSIS — O24414 Gestational diabetes mellitus in pregnancy, insulin controlled: Secondary | ICD-10-CM | POA: Diagnosis present

## 2021-03-15 DIAGNOSIS — M3320 Polymyositis, organ involvement unspecified: Secondary | ICD-10-CM | POA: Diagnosis present

## 2021-03-15 DIAGNOSIS — Z3A37 37 weeks gestation of pregnancy: Secondary | ICD-10-CM

## 2021-03-15 DIAGNOSIS — O99892 Other specified diseases and conditions complicating childbirth: Secondary | ICD-10-CM | POA: Diagnosis present

## 2021-03-15 DIAGNOSIS — O24113 Pre-existing diabetes mellitus, type 2, in pregnancy, third trimester: Secondary | ICD-10-CM | POA: Diagnosis present

## 2021-03-15 DIAGNOSIS — O2412 Pre-existing diabetes mellitus, type 2, in childbirth: Principal | ICD-10-CM | POA: Diagnosis present

## 2021-03-15 DIAGNOSIS — Z794 Long term (current) use of insulin: Secondary | ICD-10-CM | POA: Diagnosis not present

## 2021-03-15 DIAGNOSIS — Z23 Encounter for immunization: Secondary | ICD-10-CM

## 2021-03-15 DIAGNOSIS — O99214 Obesity complicating childbirth: Secondary | ICD-10-CM | POA: Diagnosis present

## 2021-03-15 DIAGNOSIS — O99824 Streptococcus B carrier state complicating childbirth: Secondary | ICD-10-CM | POA: Diagnosis present

## 2021-03-15 DIAGNOSIS — Z7984 Long term (current) use of oral hypoglycemic drugs: Secondary | ICD-10-CM | POA: Diagnosis not present

## 2021-03-15 DIAGNOSIS — O0990 Supervision of high risk pregnancy, unspecified, unspecified trimester: Secondary | ICD-10-CM

## 2021-03-15 LAB — COMPREHENSIVE METABOLIC PANEL
ALT: 14 U/L (ref 0–44)
AST: 32 U/L (ref 15–41)
Albumin: 2.7 g/dL — ABNORMAL LOW (ref 3.5–5.0)
Alkaline Phosphatase: 96 U/L (ref 38–126)
Anion gap: 7 (ref 5–15)
BUN: 8 mg/dL (ref 6–20)
CO2: 22 mmol/L (ref 22–32)
Calcium: 8.8 mg/dL — ABNORMAL LOW (ref 8.9–10.3)
Chloride: 103 mmol/L (ref 98–111)
Creatinine, Ser: 0.74 mg/dL (ref 0.44–1.00)
GFR, Estimated: >60 mL/min (ref >60)
Glucose, Bld: 111 mg/dL — ABNORMAL HIGH (ref 70–99)
Potassium: 3.9 mmol/L (ref 3.5–5.1)
Sodium: 132 mmol/L — ABNORMAL LOW (ref 135–145)
Total Bilirubin: 0.5 mg/dL (ref 0.3–1.2)
Total Protein: 7.5 g/dL (ref 6.5–8.1)

## 2021-03-15 LAB — CBC
HCT: 36.8 % (ref 36.0–46.0)
Hemoglobin: 11.6 g/dL — ABNORMAL LOW (ref 12.0–15.0)
MCH: 26.5 pg (ref 26.0–34.0)
MCHC: 31.5 g/dL (ref 30.0–36.0)
MCV: 84 fL (ref 80.0–100.0)
Platelets: 262 10*3/uL (ref 150–400)
RBC: 4.38 MIL/uL (ref 3.87–5.11)
RDW: 14.6 % (ref 11.5–15.5)
WBC: 10.1 10*3/uL (ref 4.0–10.5)
nRBC: 0 % (ref 0.0–0.2)

## 2021-03-15 LAB — TYPE AND SCREEN
ABO/RH(D): O POS
Antibody Screen: NEGATIVE

## 2021-03-15 LAB — RESP PANEL BY RT-PCR (FLU A&B, COVID) ARPGX2
Influenza A by PCR: NEGATIVE
Influenza B by PCR: NEGATIVE
SARS Coronavirus 2 by RT PCR: NEGATIVE

## 2021-03-15 LAB — GLUCOSE, CAPILLARY
Glucose-Capillary: 105 mg/dL — ABNORMAL HIGH (ref 70–99)
Glucose-Capillary: 87 mg/dL (ref 70–99)

## 2021-03-15 MED ORDER — PREDNISONE 5 MG PO TABS
5.0000 mg | ORAL_TABLET | Freq: Every day | ORAL | Status: DC
  Administered 2021-03-16: 08:00:00 5 mg via ORAL
  Filled 2021-03-15 (×2): qty 1

## 2021-03-15 MED ORDER — INSULIN REGULAR(HUMAN) IN NACL 100-0.9 UT/100ML-% IV SOLN
INTRAVENOUS | Status: DC
  Filled 2021-03-15: qty 100

## 2021-03-15 MED ORDER — DEXTROSE 50 % IV SOLN
0.0000 mL | INTRAVENOUS | Status: DC | PRN
Start: 2021-03-15 — End: 2021-03-15

## 2021-03-15 MED ORDER — MISOPROSTOL 25 MCG QUARTER TABLET: 25.0000 ug | ORAL_TABLET | ORAL | Status: DC | PRN

## 2021-03-15 MED ORDER — LIDOCAINE HCL (PF) 1 % IJ SOLN: 30.0000 mL | INTRAMUSCULAR | Status: DC | PRN

## 2021-03-15 MED ORDER — INSULIN NPH (HUMAN) (ISOPHANE) 100 UNIT/ML ~~LOC~~ SUSP
10.0000 [IU] | Freq: Every day | SUBCUTANEOUS | Status: DC
  Administered 2021-03-15: 22:00:00 10 [IU] via SUBCUTANEOUS
  Filled 2021-03-15: qty 10

## 2021-03-15 MED ORDER — INSULIN ASPART 100 UNIT/ML IJ SOLN: 0.0000 [IU] | INTRAMUSCULAR | Status: DC

## 2021-03-15 MED ORDER — TERBUTALINE SULFATE 1 MG/ML IJ SOLN: 0.2500 mg | Freq: Once | INTRAMUSCULAR | Status: DC | PRN

## 2021-03-15 MED ORDER — OXYTOCIN-SODIUM CHLORIDE 30-0.9 UT/500ML-% IV SOLN: 2.5000 [IU]/h | INTRAVENOUS | Status: DC

## 2021-03-15 MED ORDER — FENTANYL CITRATE (PF) 100 MCG/2ML IJ SOLN: 50.0000 ug | INTRAMUSCULAR | Status: DC | PRN

## 2021-03-15 MED ORDER — INSULIN NPH (HUMAN) (ISOPHANE) 100 UNIT/ML ~~LOC~~ SUSP: 10.0000 [IU] | Freq: Every day | SUBCUTANEOUS | Status: DC

## 2021-03-15 MED ORDER — OXYTOCIN-SODIUM CHLORIDE 30-0.9 UT/500ML-% IV SOLN
1.0000 m[IU]/min | INTRAVENOUS | Status: DC
Start: 2021-03-15 — End: 2021-03-16
  Administered 2021-03-16: 2 m[IU]/min via INTRAVENOUS
  Filled 2021-03-15 (×2): qty 500

## 2021-03-15 MED ORDER — ACETAMINOPHEN 325 MG PO TABS: 650.0000 mg | ORAL_TABLET | ORAL | Status: DC | PRN

## 2021-03-15 MED ORDER — LACTATED RINGERS IV SOLN
INTRAVENOUS | Status: DC
  Administered 2021-03-16 (×2): 125 mL/h via INTRAVENOUS

## 2021-03-15 MED ORDER — SODIUM CHLORIDE 0.9 % IV SOLN
5.0000 10*6.[IU] | Freq: Once | INTRAVENOUS | Status: AC
  Administered 2021-03-15: 22:00:00 5 10*6.[IU] via INTRAVENOUS
  Filled 2021-03-15: qty 5

## 2021-03-15 MED ORDER — PENICILLIN G POT IN DEXTROSE 60000 UNIT/ML IV SOLN
3.0000 10*6.[IU] | INTRAVENOUS | Status: DC
  Administered 2021-03-16 (×5): 3 10*6.[IU] via INTRAVENOUS
  Filled 2021-03-15 (×5): qty 50

## 2021-03-15 MED ORDER — LACTATED RINGERS IV SOLN
500.0000 mL | INTRAVENOUS | Status: DC | PRN
  Administered 2021-03-16: 14:00:00 500 mL via INTRAVENOUS

## 2021-03-15 MED ORDER — SOD CITRATE-CITRIC ACID 500-334 MG/5ML PO SOLN: 30.0000 mL | ORAL | Status: DC | PRN

## 2021-03-15 MED ORDER — OXYCODONE-ACETAMINOPHEN 5-325 MG PO TABS: 1.0000 | ORAL_TABLET | ORAL | Status: DC | PRN

## 2021-03-15 MED ORDER — MISOPROSTOL 50MCG HALF TABLET
50.0000 ug | ORAL_TABLET | ORAL | Status: DC | PRN
  Administered 2021-03-15: 19:00:00 50 ug via BUCCAL
  Filled 2021-03-15: qty 1

## 2021-03-15 MED ORDER — LACTATED RINGERS IV SOLN: INTRAVENOUS | Status: DC

## 2021-03-15 MED ORDER — AZATHIOPRINE 50 MG PO TABS
100.0000 mg | ORAL_TABLET | Freq: Every day | ORAL | Status: DC
  Administered 2021-03-16: 11:00:00 100 mg via ORAL
  Filled 2021-03-15 (×2): qty 2

## 2021-03-15 MED ORDER — ONDANSETRON HCL 4 MG/2ML IJ SOLN: 4.0000 mg | Freq: Four times a day (QID) | INTRAMUSCULAR | Status: DC | PRN

## 2021-03-15 MED ORDER — DEXTROSE IN LACTATED RINGERS 5 % IV SOLN: INTRAVENOUS | Status: DC

## 2021-03-15 MED ORDER — OXYTOCIN BOLUS FROM INFUSION
333.0000 mL | Freq: Once | INTRAVENOUS | Status: AC
  Administered 2021-03-16: 19:00:00 333 mL via INTRAVENOUS

## 2021-03-15 NOTE — Progress Notes (Signed)
Foley bulb out at 2236

## 2021-03-15 NOTE — Progress Notes (Signed)
Patient ID: Rachael Patton, female   DOB: 13-Apr-2000, 20 y.o.   MRN: [id number]   Rachael Patton is a 20 y.o. G1P0 at 100w0d  admitted for induction of labor due to Diabetes.  Subjective:   Objective: Vitals:   03/15/21 1802 03/15/21 1810  BP:  126/79  Pulse:  77  Resp: 17   Temp: 98.4 F (36.9 C)   TempSrc: Oral   Weight: 121.7 kg   Height: 5\' 5"  (1.651 m)    No intake/output data recorded.  FHT:  FHR: 125 bpm, variability: moderate,  accelerations:  Abscent,  decelerations:  Absent UC:   irratability, toco readjusted to better detect contractions SVE:   Dilation: 1.5 Effacement (%): 50 Station: -2 Exam by:: Leftwitch CNM   Labs: Lab Results  Component Value Date   WBC 10.1 03/15/2021   HGB 11.6 (L) 03/15/2021   HCT 36.8 03/15/2021   MCV 84.0 03/15/2021   PLT 262 03/15/2021    Assessment / Plan: Early labor , FB in place. Continue cytotec q 4 hours Patient can have diabetic diet Labor:  early labor Fetal Wellbeing:  Category I Pain Control:  Labor support without medications Anticipated MOD:  NSVD  03/17/2021 03/15/2021, 9:20 PM

## 2021-03-15 NOTE — H&P (Addendum)
OBSTETRIC ADMISSION HISTORY AND PHYSICAL  Vickye Astorino is a 20 y.o. female G1P0 with IUP at 67w0dby LMP presenting for IOL for T2DM on insulin and metformin. She reports +FMs, No LOF, no VB, no blurry vision, headaches or peripheral edema, and RUQ pain.  She plans on breast feeding.  [facility] --->  Estimated Date of Delivery: 04/05/21  Sono:    _0 , CWD, normal anatomy, vertex presentation,  18% EFW   Prenatal History/Complications: polymyositis--followed by rheumotology  Past Medical History: Past Medical History:  Diagnosis Date   Anemia    Asthma    As a child   Myositis    Pre-diabetes    Prediabetes    Rhabdomyolysis    Thyroid disease    found at age 20   Past Surgical History: Past Surgical History:  Procedure Laterality Date   FOOT SURGERY Left 06/2019   MUSCLE BIOPSY Right 05/14/2020   Procedure: RIGHT THIGH MUSCLE BIOPSY;  Surgeon: ADwan Bolt MD;  Location: [facility] OR;  Service: General;  Laterality: Right;  ROOM 3 STARTING AT 04:00PM FOR 45MIN    Obstetrical History: OB History     Gravida  1   Para      Term      Preterm      AB      Living         SAB      IAB      Ectopic      Multiple      Live Births              Social History Social History   Socioeconomic History   Marital status: Single    Spouse name: Not on file   Number of children: Not on file   Years of education: Not on file   Highest education level: Not on file  Occupational History   Not on file  Tobacco Use   Smoking status: Never   Smokeless tobacco: Never  Vaping Use   Vaping Use: Never used  Substance and Sexual Activity   Alcohol use: Not Currently   Drug use: Never   Sexual activity: Yes    Birth control/protection: Condom  Other Topics Concern   Not on file  Social History Narrative   Grad[facility] [employer]   Starting day care job soon   Lives with boyfriend    No children   Social Determinants of Health   Financial Resource Strain: Not on file  Food Insecurity: Not on file  Transportation Needs: Not on file  Physical Activity: Not on file  Stress: Not on file  Social Connections: Not on file    Family History: Family History  Problem Relation Age of Onset   Diabetes Mother    Diabetes Maternal Grandmother    Diabetes Paternal Grandmother    Stroke Paternal Grandmother    Stroke Paternal Grandfather    Diabetes Paternal Grandfather    Multiple sclerosis Cousin    Lupus Cousin     Allergies: No Known Allergies  Medications Prior to Admission  Medication Sig Dispense Refill Last Dose   azaTHIOprine (IMURAN) 50 MG tablet Take 2 tablets (100 mg total) by mouth daily. 60 tablet 2    Blood Glucose Monitoring Suppl (ONETOUCH VERIO) w/Device KIT 1 Device by Does not apply route in the morning, at noon, in the evening, and at bedtime. 1 kit  0    insulin NPH Human (NOVOLIN N) 100 UNIT/ML injection Inject 0.15 mLs (15 Units total) into the skin at bedtime. 10 mL 3    Insulin Syringe-Needle U-100 30G X 15/64" 0.5 ML MISC Use one syringe/needle to inject insulin at bedtime 100 each 3    Iron, Ferrous Sulfate, 325 (65 Fe) MG TABS Take one tablet every other day 90 tablet 1    metFORMIN (GLUCOPHAGE) 1000 MG tablet Take 1 tablet (1,000 mg total) by mouth 2 (two) times daily with a meal. 60 tablet 3    ONETOUCH VERIO test strip SMARTSIG:Via Meter 3-4 Times Daily 100 each 5    predniSONE (DELTASONE) 5 MG tablet Take 1 tablet (5 mg total) by mouth daily with breakfast. 30 tablet 2    Prenatal MV-Min-Fe Fum-FA-DHA (PRENATAL+DHA PO) Take 1 capsule by mouth daily.        Review of Systems   All systems reviewed and negative except as stated in HPI  Blood pressure 126/79, pulse 77, temperature 98.4 F (36.9 C), temperature source Oral, resp. rate 17, height 5' 5" (1.651 m), weight 121.7 kg, last menstrual period 07/03/2020. General appearance:  alert, cooperative, and no distress Lungs: clear to auscultation bilaterally Heart: regular rate and rhythm Abdomen: soft, non-tender; bowel sounds normal Pelvic: n/a Extremities: Homans sign is negative, no sign of DVT DTR's wnl Presentation: cephalic Fetal monitoringBaseline: 135 bpm Uterine activity rare Dilation: 1.5 Effacement (%): 50 Station: -2 Exam by:: Leftwitch CNM   Prenatal labs: ABO, Rh: --/--/O POS (12/18 1800) Antibody: NEG (12/18 1800) Rubella:   RPR: Non Reactive (10/10 1458)  HBsAg: NON-REACTIVE (02/10 1129)  HIV: Non Reactive (10/10 1458)  GBS: Positive/-- (12/09 1008)  T2DM pre pregnancy Genetic screening  wnl Anatomy US wnl  Prenatal Transfer Tool  Maternal Diabetes: Yes:  Diabetes Type:  Pre-pregnancy Genetic Screening: Normal Maternal Ultrasounds/Referrals: Normal Fetal Ultrasounds or other Referrals:  Fetal echo Maternal Substance Abuse:  No Significant Maternal Medications:  Meds include: Other: Prednisone, Imuran Significant Maternal Lab Results: Group B Strep positive   Results for orders placed or performed during the hospital encounter of 03/15/21 (from the past 24 hour(s))  CBC   Collection Time: 03/15/21  5:43 PM  Result Value Ref Range   WBC 10.1 4.0 - 10.5 K/uL   RBC 4.38 3.87 - 5.11 MIL/uL   Hemoglobin 11.6 (L) 12.0 - 15.0 g/dL   HCT 36.8 36.0 - 46.0 %   MCV 84.0 80.0 - 100.0 fL   MCH 26.5 26.0 - 34.0 pg   MCHC 31.5 30.0 - 36.0 g/dL   RDW 14.6 11.5 - 15.5 %   Platelets 262 150 - 400 K/uL   nRBC 0.0 0.0 - 0.2 %  Comprehensive metabolic panel   Collection Time: 03/15/21  5:43 PM  Result Value Ref Range   Sodium 132 (L) 135 - 145 mmol/L   Potassium 3.9 3.5 - 5.1 mmol/L   Chloride 103 98 - 111 mmol/L   CO2 22 22 - 32 mmol/L   Glucose, Bld 111 (H) 70 - 99 mg/dL   BUN 8 6 - 20 mg/dL   Creatinine, Ser 0.74 0.44 - 1.00 mg/dL   Calcium 8.8 (L) 8.9 - 10.3 mg/dL   Total Protein 7.5 6.5 - 8.1 g/dL   Albumin 2.7 (L) 3.5 - 5.0 g/dL    AST 32 15 - 41 U/L   ALT 14 0 - 44 U/L   Alkaline Phosphatase 96 38 - 126 U/L   Total  Bilirubin 0.5 0.3 - 1.2 mg/dL   GFR, Estimated >60 >60 mL/min   Anion gap 7 5 - 15  Type and screen   Collection Time: 03/15/21  6:00 PM  Result Value Ref Range   ABO/RH(D) O POS    Antibody Screen NEG    Sample Expiration      03/18/2021,2359 Performed at [facility] [address] [place],  [address]   Glucose, capillary   Collection Time: 03/15/21  6:02 PM  Result Value Ref Range   Glucose-Capillary 105 (H) 70 - 99 mg/dL    Patient Active Problem List   Diagnosis Date Noted   Type 2 diabetes mellitus complicating pregnancy in third trimester, antepartum 03/15/2021   [redacted] weeks gestation of pregnancy 03/06/2021   Insulin controlled gestational diabetes mellitus (GDM) in third trimester 01/07/2021   SS-A antibody positive 11/26/2020   Maternal obesity affecting pregnancy, antepartum 11/18/2020   Supervision of high risk pregnancy, antepartum 10/21/2020   Polymyositis ([place]) 07/18/2020   ILD (interstitial lung disease) ([place]) 06/25/2020   Myositis associated antibody positive 06/23/2020   High risk medication use 05/08/2020   Morbid obesity ([place]) 04/30/2020   Prolactin increased 04/30/2020   Hypothyroidism 04/30/2020   Prediabetes 04/30/2020   Iron deficiency anemia 04/30/2020   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    Assessment/Plan:  Roshini Fulwider is a 20 y.o. G1P0 at 80w0dhere for IOL for T2DM/insulin + oral FB placed and Cytotec buccal dose given  #Labor:Foley bulb and Cytotec #Pain: IV pain medication PRN #FWB: Category I #ID:  GBS positive, PCN #MOF: breast #MOC:unsure #Circ:  N/a  LFatima Blank CNM  03/15/2021, 8:09 PM

## 2021-03-16 ENCOUNTER — Inpatient Hospital Stay (HOSPITAL_COMMUNITY): Payer: BLUE CROSS/BLUE SHIELD | Admitting: Anesthesiology

## 2021-03-16 ENCOUNTER — Encounter (HOSPITAL_COMMUNITY): Payer: Self-pay | Admitting: Obstetrics and Gynecology

## 2021-03-16 ENCOUNTER — Other Ambulatory Visit: Payer: BLUE CROSS/BLUE SHIELD

## 2021-03-16 DIAGNOSIS — O99824 Streptococcus B carrier state complicating childbirth: Secondary | ICD-10-CM

## 2021-03-16 DIAGNOSIS — Z3A37 37 weeks gestation of pregnancy: Secondary | ICD-10-CM

## 2021-03-16 DIAGNOSIS — O2412 Pre-existing diabetes mellitus, type 2, in childbirth: Secondary | ICD-10-CM

## 2021-03-16 LAB — COMPREHENSIVE METABOLIC PANEL
ALT: 14 U/L (ref 0–44)
AST: 33 U/L (ref 15–41)
Albumin: 2.3 g/dL — ABNORMAL LOW (ref 3.5–5.0)
Alkaline Phosphatase: 87 U/L (ref 38–126)
Anion gap: 9 (ref 5–15)
BUN: 6 mg/dL (ref 6–20)
CO2: 21 mmol/L — ABNORMAL LOW (ref 22–32)
Calcium: 8.8 mg/dL — ABNORMAL LOW (ref 8.9–10.3)
Chloride: 103 mmol/L (ref 98–111)
Creatinine, Ser: 0.84 mg/dL (ref 0.44–1.00)
GFR, Estimated: >60 mL/min (ref >60)
Glucose, Bld: 86 mg/dL (ref 70–99)
Potassium: 4.3 mmol/L (ref 3.5–5.1)
Sodium: 133 mmol/L — ABNORMAL LOW (ref 135–145)
Total Bilirubin: 0.9 mg/dL (ref 0.3–1.2)
Total Protein: 7 g/dL (ref 6.5–8.1)

## 2021-03-16 LAB — CBC
HCT: 33.3 % — ABNORMAL LOW (ref 36.0–46.0)
Hemoglobin: 10.7 g/dL — ABNORMAL LOW (ref 12.0–15.0)
MCH: 26.6 pg (ref 26.0–34.0)
MCHC: 32.1 g/dL (ref 30.0–36.0)
MCV: 82.8 fL (ref 80.0–100.0)
Platelets: 239 10*3/uL (ref 150–400)
RBC: 4.02 MIL/uL (ref 3.87–5.11)
RDW: 14.6 % (ref 11.5–15.5)
WBC: 12.1 10*3/uL — ABNORMAL HIGH (ref 4.0–10.5)
nRBC: 0 % (ref 0.0–0.2)

## 2021-03-16 LAB — GLUCOSE, CAPILLARY
Glucose-Capillary: 81 mg/dL (ref 70–99)
Glucose-Capillary: 85 mg/dL (ref 70–99)
Glucose-Capillary: 87 mg/dL (ref 70–99)
Glucose-Capillary: 86 mg/dL (ref 70–99)
Glucose-Capillary: 75 mg/dL (ref 70–99)

## 2021-03-16 LAB — RPR: RPR Ser Ql: NONREACTIVE

## 2021-03-16 MED ORDER — EPHEDRINE 5 MG/ML INJ: 10.0000 mg | INTRAVENOUS | Status: DC | PRN

## 2021-03-16 MED ORDER — BENZOCAINE-MENTHOL 20-0.5 % EX AERO: 1.0000 "application " | INHALATION_SPRAY | CUTANEOUS | Status: DC | PRN

## 2021-03-16 MED ORDER — PHENYLEPHRINE 40 MCG/ML (10ML) SYRINGE FOR IV PUSH (FOR BLOOD PRESSURE SUPPORT)
80.0000 ug | PREFILLED_SYRINGE | INTRAVENOUS | Status: DC | PRN
  Filled 2021-03-16: qty 10

## 2021-03-16 MED ORDER — TETANUS-DIPHTH-ACELL PERTUSSIS 5-2.5-18.5 LF-MCG/0.5 IM SUSY
0.5000 mL | PREFILLED_SYRINGE | Freq: Once | INTRAMUSCULAR | Status: AC
  Administered 2021-03-17: 17:00:00 0.5 mL via INTRAMUSCULAR
  Filled 2021-03-16: qty 0.5

## 2021-03-16 MED ORDER — SENNOSIDES-DOCUSATE SODIUM 8.6-50 MG PO TABS
2.0000 | ORAL_TABLET | ORAL | Status: DC
  Administered 2021-03-16 – 2021-03-17 (×2): 2 via ORAL
  Filled 2021-03-16 (×2): qty 2

## 2021-03-16 MED ORDER — LACTATED RINGERS IV SOLN: 500.0000 mL | Freq: Once | INTRAVENOUS | Status: DC

## 2021-03-16 MED ORDER — WITCH HAZEL-GLYCERIN EX PADS: 1.0000 "application " | MEDICATED_PAD | CUTANEOUS | Status: DC | PRN

## 2021-03-16 MED ORDER — ONDANSETRON HCL 4 MG/2ML IJ SOLN: 4.0000 mg | INTRAMUSCULAR | Status: DC | PRN

## 2021-03-16 MED ORDER — DIPHENHYDRAMINE HCL 25 MG PO CAPS: 25.0000 mg | ORAL_CAPSULE | Freq: Four times a day (QID) | ORAL | Status: DC | PRN

## 2021-03-16 MED ORDER — ONDANSETRON HCL 4 MG PO TABS: 4.0000 mg | ORAL_TABLET | ORAL | Status: DC | PRN

## 2021-03-16 MED ORDER — DIBUCAINE (PERIANAL) 1 % EX OINT: 1.0000 "application " | TOPICAL_OINTMENT | CUTANEOUS | Status: DC | PRN

## 2021-03-16 MED ORDER — DIPHENHYDRAMINE HCL 50 MG/ML IJ SOLN: 12.5000 mg | INTRAMUSCULAR | Status: DC | PRN

## 2021-03-16 MED ORDER — IBUPROFEN 600 MG PO TABS
600.0000 mg | ORAL_TABLET | Freq: Four times a day (QID) | ORAL | Status: DC
  Administered 2021-03-16 – 2021-03-18 (×6): 600 mg via ORAL
  Filled 2021-03-16 (×6): qty 1

## 2021-03-16 MED ORDER — COCONUT OIL OIL: 1.0000 "application " | TOPICAL_OIL | Status: DC | PRN

## 2021-03-16 MED ORDER — PRENATAL MULTIVITAMIN CH
1.0000 | ORAL_TABLET | Freq: Every day | ORAL | Status: DC
  Administered 2021-03-17: 12:00:00 1 via ORAL
  Filled 2021-03-16: qty 1

## 2021-03-16 MED ORDER — SIMETHICONE 80 MG PO CHEW: 80.0000 mg | CHEWABLE_TABLET | ORAL | Status: DC | PRN

## 2021-03-16 MED ORDER — PHENYLEPHRINE 40 MCG/ML (10ML) SYRINGE FOR IV PUSH (FOR BLOOD PRESSURE SUPPORT): 80.0000 ug | PREFILLED_SYRINGE | INTRAVENOUS | Status: DC | PRN

## 2021-03-16 MED ORDER — LIDOCAINE-EPINEPHRINE (PF) 2 %-1:200000 IJ SOLN
INTRAMUSCULAR | Status: DC | PRN
  Administered 2021-03-16: 5 mL via EPIDURAL

## 2021-03-16 MED ORDER — FENTANYL-BUPIVACAINE-NACL 0.5-0.125-0.9 MG/250ML-% EP SOLN
12.0000 mL/h | EPIDURAL | Status: DC | PRN
  Administered 2021-03-16: 15:00:00 12 mL/h via EPIDURAL
  Filled 2021-03-16: qty 250

## 2021-03-16 MED ORDER — ACETAMINOPHEN 325 MG PO TABS: 650.0000 mg | ORAL_TABLET | ORAL | Status: DC | PRN

## 2021-03-16 NOTE — Progress Notes (Signed)
Inpatient Diabetes Program Recommendations  Diabetes Treatment Program Recommendations  ADA Standards of Care 2018 Diabetes in Pregnancy Target Glucose Ranges:  Fasting: 60 - 90 mg/dL Preprandial: 60 - 358 mg/dL 1 hr postprandial: Less than 140mg /dL (from first bite of meal) 2 hr postprandial: Less than 120 mg/dL (from first bite of meal)      Lab Results  Component Value Date   GLUCAP 85 03/16/2021   HGBA1C 6.2 (H) 10/21/2020    Review of Glycemic Control  Latest Reference Range & Units 03/15/21 22:25 03/16/21 02:02 03/16/21 05:53  Glucose-Capillary 70 - 99 mg/dL 87 81 85   Diabetes history: PreDM Outpatient Diabetes medications: NPH 15 units QHS, Metformin 1000 mg BID Prior to Pregnancy: Metformin 1000 mg BID Current orders for Inpatient glycemic control: NPH 10 units QHS, Novolog 0-14 units Q4H Prednisone 5 mg QAM  Inpatient Diabetes Program Recommendations:    With plan for IOL- Foley bulb/Pitocin consider discontinuing NPH.   Thanks, 03/18/21, MSN, RNC-OB Diabetes Coordinator [phone] (8a-5p)

## 2021-03-16 NOTE — Anesthesia Preprocedure Evaluation (Signed)
Anesthesia Evaluation  Patient identified by MRN, date of birth, ID band Patient awake    Reviewed: Allergy & Precautions, NPO status , Patient's Chart, lab work & pertinent test results  Airway Mallampati: III  TM Distance: >3 FB Neck ROM: Full    Dental no notable dental hx.    Pulmonary asthma ,    Pulmonary exam normal breath sounds clear to auscultation       Cardiovascular negative cardio ROS Normal cardiovascular exam Rhythm:Regular Rate:Normal     Neuro/Psych negative neurological ROS  negative psych ROS   GI/Hepatic negative GI ROS, Neg liver ROS,   Endo/Other  diabetes, Type 2, Insulin Dependent, Oral Hypoglycemic AgentsHypothyroidism Morbid obesity (BMI 45)  Renal/GU negative Renal ROS  negative genitourinary   Musculoskeletal negative musculoskeletal ROS (+)   Abdominal   Peds  Hematology negative hematology ROS (+)   Anesthesia Other Findings H/o polymyositis on prednisone   IOL for DMI   Reproductive/Obstetrics (+) Pregnancy                             Anesthesia Physical Anesthesia Plan  ASA: 3  Anesthesia Plan: Epidural   Post-op Pain Management:    Induction:   PONV Risk Score and Plan: Treatment may vary due to age or medical condition  Airway Management Planned: Natural Airway  Additional Equipment:   Intra-op Plan:   Post-operative Plan:   Informed Consent: I have reviewed the patients History and Physical, chart, labs and discussed the procedure including the risks, benefits and alternatives for the proposed anesthesia with the patient or authorized representative who has indicated his/her understanding and acceptance.     Dental advisory given  Plan Discussed with: CRNA  Anesthesia Plan Comments:         Anesthesia Quick Evaluation

## 2021-03-16 NOTE — Discharge Summary (Addendum)
Postpartum Discharge Summary     Patient Name: Rachael Patton DOB: March 26, 2001 MRN: [id number]  Date of admission: 03/15/2021 Delivery date:03/16/2021  Delivering provider: PRAY, Joycelyn Schmid E  Date of discharge: 03/18/2021  Admitting diagnosis: Type 2 diabetes mellitus complicating pregnancy in third trimester, antepartum [O24.113] Intrauterine pregnancy: [redacted]w[redacted]d     Secondary diagnosis:  Principal Problem:   Type 2 diabetes mellitus complicating pregnancy in third trimester, antepartum Active Problems:   Polymyositis ([place])   Supervision of high risk pregnancy, antepartum   Insulin controlled gestational diabetes mellitus (GDM) in third trimester  Additional problems: None    Discharge diagnosis: Term Pregnancy Delivered, Gestational Hypertension, and Type 2 DM                                              Post partum procedures: None Augmentation: Pitocin, Cytotec, and IP Foley Complications: None  Hospital course: Induction of Labor With Vaginal Delivery   20 y.o. yo G1P0 at [redacted]w[redacted]d was admitted to the hospital 03/15/2021 for induction of labor.  Indication for induction: TYPE 2 DM.  Patient had an uncomplicated labor course as follows: Membrane Rupture Time/Date: 2:27 PM ,03/16/2021   Delivery Method:Vaginal, Spontaneous  Episiotomy: None  Lacerations:  2nd degree;Perineal  Details of delivery can be found in separate delivery note.  Patient had a routine postpartum course. For her blood sugars: her post partum fasting BG was 92 and per patient she was not on anything pre pregnancy and therefore she was not continued on any medications post partum. She has an endocrinologist who she will follow up with outpatient for further management. Patient is discharged home 03/18/21.  Newborn Data: Birth date:03/16/2021  Birth time:6:49 PM  Gender:Female  Living status:Living  Apgars:9 ,9  Weight:2460 g   Magnesium Sulfate received: No BMZ received: No Rhophylac:N/A MMR:N/A T-DaP:  Given 12/20 Flu: N/A declined Transfusion:No  Physical exam  Vitals:   03/17/21 0930 03/17/21 1500 03/17/21 2004 03/18/21 0506  BP: 123/84 118/74 133/82 121/83  Pulse: 71 73 85 76  Resp: $Remo'18 18 20 18  'ukufB$ Temp: 98.3 F (36.8 C) 98 F (36.7 C) 98 F (36.7 C) 97.9 F (36.6 C)  TempSrc: Oral Oral Oral Oral  SpO2: 100% 100% 100% 100%  Weight:      Height:       General: alert, cooperative, and no distress Lochia: appropriate Uterine Fundus: firm DVT Evaluation: No evidence of DVT seen on physical exam. No significant calf/ankle edema. Calf/Ankle edema is present Labs: Lab Results  Component Value Date   WBC 12.1 (H) 03/16/2021   HGB 10.7 (L) 03/16/2021   HCT 33.3 (L) 03/16/2021   MCV 82.8 03/16/2021   PLT 239 03/16/2021   CMP Latest Ref Rng & Units 03/17/2021  Glucose 70 - 99 mg/dL 92  BUN 6 - 20 mg/dL -  Creatinine 0.44 - 1.00 mg/dL -  Sodium 135 - 145 mmol/L -  Potassium 3.5 - 5.1 mmol/L -  Chloride 98 - 111 mmol/L -  CO2 22 - 32 mmol/L -  Calcium 8.9 - 10.3 mg/dL -  Total Protein 6.5 - 8.1 g/dL -  Total Bilirubin 0.3 - 1.2 mg/dL -  Alkaline Phos 38 - 126 U/L -  AST 15 - 41 U/L -  ALT 0 - 44 U/L -   Edinburgh Score: Edinburgh Postnatal Depression Scale Screening Tool 03/17/2021  I have been able to laugh and see the funny side of things. 0  I have looked forward with enjoyment to things. 0  I have blamed myself unnecessarily when things went wrong. 1  I have been anxious or worried for no good reason. 1  I have felt scared or panicky for no good reason. 0  Things have been getting on top of me. 1  I have been so unhappy that I have had difficulty sleeping. 0  I have felt sad or miserable. 0  I have been so unhappy that I have been crying. 0  The thought of harming myself has occurred to me. 0  Edinburgh Postnatal Depression Scale Total 3     After visit meds:  Allergies as of 03/18/2021   No Known Allergies      Medication List     STOP taking these  medications    insulin NPH Human 100 UNIT/ML injection Commonly known as: NOVOLIN N   Insulin Syringe-Needle U-100 30G X 15/64" 0.5 ML Misc   metFORMIN 1000 MG tablet Commonly known as: GLUCOPHAGE   OneTouch Verio test strip Generic drug: glucose blood   OneTouch Verio w/Device Kit       TAKE these medications    acetaminophen 325 MG tablet Commonly known as: Tylenol Take 2 tablets (650 mg total) by mouth every 6 (six) hours as needed.   azaTHIOprine 50 MG tablet Commonly known as: IMURAN Take 2 tablets (100 mg total) by mouth daily.   ibuprofen 600 MG tablet Commonly known as: ADVIL Take 1 tablet (600 mg total) by mouth every 6 (six) hours.   Iron (Ferrous Sulfate) 325 (65 Fe) MG Tabs Take one tablet every other day   predniSONE 5 MG tablet Commonly known as: DELTASONE Take 1 tablet (5 mg total) by mouth daily with breakfast.   PRENATAL+DHA PO Take 1 capsule by mouth daily.         Discharge home in stable condition Infant Feeding: Bottle Infant Disposition:home with mother Discharge instruction: per After Visit Summary and Postpartum booklet. Activity: Advance as tolerated. Pelvic rest for 6 weeks.  Diet: routine diet Future Appointments: Future Appointments  Date Time Provider Drain  04/15/2021  2:00 PM Collier Salina, MD [facility] None  04/28/2021  2:10 PM Woodroe Mode, MD [place] None   Follow up Visit:  [place] Follow up.   Specialty: Obstetrics and Gynecology Contact information: [address] [place] [phone]                 Please schedule this patient for a In person postpartum visit in 6 weeks with the following provider: Any provider. Additional Postpartum F/U: None   High risk pregnancy complicated by:  J6BH , will follow up with endocrinologist. Delivery mode:  Vaginal, Spontaneous  Anticipated Birth  Control:  Nexplanon at [facility] follow-up appt  Message sent to schedulers on 12/19 by Dr Thompson Grayer  Bartholomew Crews PGY1  GME ATTESTATION:  I saw and evaluated the patient. I agree with the findings and the plan of care as documented in the residents note.  Renard Matter, MD, MPH OB Fellow, [place] [facility] 03/18/2021 9:12 AM

## 2021-03-16 NOTE — Progress Notes (Signed)
Labor Progress Note Rachael Patton is a 20 y.o. G1P0 at [redacted]w[redacted]d presented for IOL for T2DM. S: Patient is resting comfortably. Notes contractions are mild and has not required anything for pain.  O:  BP 131/90    Pulse 73    Temp 98 F (36.7 C) (Axillary)    Resp 16    Ht 5\' 5"  (1.651 m)    Wt 121.7 kg    LMP 07/03/2020 (LMP Unknown)    BMI 44.65 kg/m  EFM: 135 bpm baseline/moderate variability/+ accels, no decels Toco: contractions q2-4 minutes Pitocin: 10  CVE: Dilation: 4 Effacement (%): 70 Cervical Position: Posterior Station: -2 Presentation: Vertex Exam by:: MD Ayuub Penley   A&P: 20 y.o. G1P0 [redacted]w[redacted]d with IOL for T2DM on insulin. #Labor: Progressing well. On Pitocin at 10, SVE changed to 4/70/-2, head ballotable thus no AROM attempted. Consider AROM at next check if head well applied. #Pain: PRN #FWB: Cat I, reassuring #GBS positive - PCN given, now adequate  #T2DM- on NPH 10 qAM and 20 qHS at home. EFW 18th%ile (5lbs 5 oz on 03/05/21 at 35 WGA. Given NPH 10 U qHS last night. Blood glucoses have all been in 80s, consider 5 U NPH this AM if elevated on repeat check.SSI ordered.  #Polymyositis- continue home Imuran and prednisone 5mg  daily.  14/8/22, MD Center for [facility], [facility] 9:34 AM

## 2021-03-16 NOTE — Anesthesia Procedure Notes (Signed)

## 2021-03-16 NOTE — Progress Notes (Signed)
Labor Progress Note Rachael Patton is a 20 y.o. G1P0 at [redacted]w[redacted]d presented for IOL for T2DM. S: Patient is resting comfortably. Notes contractions are mild and has not required anything for pain.  O:  BP 135/76    Pulse 70    Temp 98 F (36.7 C) (Oral)    Resp 18    Ht 5\' 5"  (1.651 m)    Wt 121.7 kg    LMP 07/03/2020 (LMP Unknown)    BMI 44.65 kg/m  EFM: 120 bpm baseline/moderate variability/+ accels, no decels Toco: contractions q2 minutes Pitocin: 12  CVE: Dilation: 4 Effacement (%): 80 Cervical Position: Posterior Station: -2 Presentation: Vertex Exam by:: MD Delwin Raczkowski   A&P: 20 y.o. G1P0 [redacted]w[redacted]d with IOL for T2DM on insulin. #Labor: Progressing well. On Pitocin at 10, SVE slightly more effaced to 4/80/-2, head non-ballotable, discussed options including AROM, vs epidural then AROM. Discussed rare risk of cord prolapse with AROM. Shared decision making used to decide she will get epidural, then we will proceed with AROM. #Pain: PRN #FWB: Cat I, reassuring #GBS positive - PCN given, now adequate  #T2DM- on NPH 10 qAM and 20 qHS at home. EFW 18th%ile (5lbs 5 oz on 03/05/21 at 35 WGA. Given NPH 10 U qHS last night. Blood glucoses have all been in 80s, SSI ordered but none needed thus far.  #Polymyositis- continue home Imuran and prednisone 5mg  daily.  14/8/22, MD Center for [facility], [facility] 2:16 PM

## 2021-03-17 ENCOUNTER — Encounter: Payer: BLUE CROSS/BLUE SHIELD | Admitting: Obstetrics and Gynecology

## 2021-03-17 LAB — GLUCOSE, RANDOM: Glucose, Bld: 92 mg/dL (ref 70–99)

## 2021-03-17 NOTE — Progress Notes (Signed)
Rachael Patton  Post Partum Day One:S/P SVD with Repair of 2nd Degree Laceration  Subjective: Patient up ad lib, denies syncope or dizziness. Reports consuming regular diet without issues and denies N/V. Denies issues with urination and reports bleeding is "normal."  Patient is bottle feeding and reports going well.  She denies issues with her breast.  Desires Nexplanon for postpartum contraception.  Pain is being appropriately managed with use of ibuprofen.  Objective: Vitals:   03/16/21 2032 03/16/21 2133 03/16/21 2235 03/17/21 0233  BP: 138/75 123/73 128/76 (!) 108/58  Pulse: (!) 53 86 89 66  Resp:   20 19  Temp:  97.8 F (36.6 C) 98.1 F (36.7 C) 98 F (36.7 C)  TempSrc:  Oral Oral Oral  SpO2:  100% 100% 100%  Weight:      Height:       Recent Labs    03/15/21 1743 03/16/21 1925  HGB 11.6* 10.7*  HCT 36.8 33.3*    Physical Exam:  General: alert, cooperative, and no distress Mood/Affect: Appropriate/Flat Lungs: clear to auscultation, no wheezes, rales or rhonchi, symmetric air entry.  Heart: normal rate and regular rhythm. Breast: not examined. Abdomen:  + bowel sounds, Soft, NT Uterine Fundus: firm, U/-2 Lochia: appropriate Laceration: Not Assessed Skin: Warm, Dry DVT Evaluation: No evidence of DVT seen on physical exam. No significant calf/ankle edema.  Assessment S/P Vaginal Delivery-Day One Normal [employer] BottleFeeding DM-T2 on Insulin  Plan: -Fasting CBG 92 -Discussed potential discharge today contingent on infant status and discharge. -Desires Nexplanon at [facility]. -Discharge Education provided. -Continue current care. -L&D team to be updated on patient status.   Cherre Robins, MSN, CNM 03/17/2021, 5:43 AM

## 2021-03-17 NOTE — Anesthesia Postprocedure Evaluation (Signed)
Anesthesia Post Note  Patient: [name]  Procedure(s) Performed: AN AD HOC LABOR EPIDURAL     Patient location during evaluation: Mother Baby Anesthesia Type: Epidural Level of consciousness: awake and alert and oriented Pain management: satisfactory to patient Vital Signs Assessment: post-procedure vital signs reviewed and stable Respiratory status: respiratory function stable Cardiovascular status: stable Postop Assessment: no headache, no backache, epidural receding, patient able to bend at knees, no signs of nausea or vomiting, adequate PO intake and able to ambulate Anesthetic complications: no   No notable events documented.  Last Vitals:  Vitals:   03/17/21 0233 03/17/21 0627  BP: (!) 108/58 121/72  Pulse: 66 71  Resp: 19 20  Temp: 36.7 C (!) 36.4 C  SpO2: 100% 100%    Last Pain:  Vitals:   03/17/21 0627  TempSrc: Oral  PainSc: 2    Pain Goal:                   [place]

## 2021-03-18 ENCOUNTER — Ambulatory Visit: Payer: BLUE CROSS/BLUE SHIELD | Admitting: Internal Medicine

## 2021-03-18 LAB — RUBELLA SCREEN: Rubella: 3.78 index (ref >0.99)

## 2021-03-18 MED ORDER — IBUPROFEN 600 MG PO TABS: 600.0000 mg | ORAL_TABLET | Freq: Four times a day (QID) | ORAL | 0 refills | Status: AC

## 2021-03-18 MED ORDER — ACETAMINOPHEN 325 MG PO TABS: 650.0000 mg | ORAL_TABLET | Freq: Four times a day (QID) | ORAL | 0 refills | Status: AC | PRN

## 2021-03-27 ENCOUNTER — Telehealth (HOSPITAL_COMMUNITY): Payer: Self-pay

## 2021-03-27 NOTE — Telephone Encounter (Signed)
"  I'm good." Patient has no questions or concerns about her healing.  "She's doing good. Eating and gaining weight. She sleeps in her bassinet." RN reviewed ABC's of safe sleep with patient. Patient declines any questions or concerns about baby.  EPDS score is 3.  Rachael Patton [facility] 03/27/2021,1132

## 2021-04-15 ENCOUNTER — Ambulatory Visit: Payer: BLUE CROSS/BLUE SHIELD | Admitting: Internal Medicine

## 2021-04-16 NOTE — Progress Notes (Signed)
Office Visit Note  Patient: Rachael Patton             Date of Birth: 2000/06/30           MRN: [id number]             PCP: Inda Coke, PA Referring: Inda Coke, PA Visit Date: 04/17/2021   Subjective:   History of Present Illness: Tanyia Hyre is a 21 y.o. female here for follow up for polymyositis on azathioprine 100 mg PO daily and prednisone 5 mg daily. She deliver her daughter about a month ago without major complications. She slipped her left leg during laboring and felt pain in the anterior hip that has gradually improved over time.  Previous HPI 12/02/20 Aneri Debruhl is a 21 y.o. female here for follow up for polymyositis on azathioprine 50 mg PO daily and prednisone 10 mg PO daily. She was started on metformin for worsening of hyperglycemia. She did not return for labs after past visit so remained on current medication dose. She feels symptoms are doing well. Right knee area remains numb in a small distribution unchanged since her biopsy.   Previous HPI: 05/08/20 Elaria Suddeth is a 21 y.o. female with history of hypothyroidism, elevated prolactin here for follow up of hospitalization last week with proximal muscle weakness found to have highly elevated CK and myoglobinuria. This was treated with IV fluids and started steroid treatment with improvement in CK from 11,796 to 4,047. She was discharged on prednisone 80mg  per day dose. Prior to this she was ill with COVID a month ago. She was having some type of GI illness in December not clear if this was related to COVID infection or separate process. Workup with labs on 1/6 and 1/7 showing transaminitis also positive Ab tests for COVID19 and Hepatitis A. Since the hospital visit she feels her symptoms are partially improved. She notices continued leg swelling and has some dyspnea with lying supine and on exertion. Skin rash on the face is slightly less warm and severe than before. She thinks strength is slightly better but  not at her baseline at all.   Review of Systems  Constitutional:  Negative for fatigue.  HENT:  Negative for mouth sores, mouth dryness and nose dryness.   Eyes:  Positive for itching. Negative for pain and dryness.  Respiratory:  Negative for shortness of breath and difficulty breathing.   Cardiovascular:  Negative for chest pain and palpitations.  Gastrointestinal:  Negative for blood in stool, constipation and diarrhea.  Endocrine: Negative for increased urination.  Genitourinary:  Negative for difficulty urinating.  Musculoskeletal:  Negative for joint pain, joint pain, joint swelling, myalgias, morning stiffness, muscle tenderness and myalgias.  Skin:  Negative for color change, rash and redness.  Allergic/Immunologic: Negative for susceptible to infections.  Neurological:  Positive for numbness. Negative for dizziness, headaches, memory loss and weakness.  Hematological:  Negative for bruising/bleeding tendency.  Psychiatric/Behavioral:  Negative for confusion.    PMFS History:  Patient Active Problem List   Diagnosis Date Noted   Type 2 diabetes mellitus complicating pregnancy in third trimester, antepartum 03/15/2021   Insulin controlled gestational diabetes mellitus (GDM) in third trimester 01/07/2021   SS-A antibody positive 11/26/2020   Maternal obesity affecting pregnancy, antepartum 11/18/2020   Supervision of high risk pregnancy, antepartum 10/21/2020   Polymyositis ([place]) 07/18/2020   ILD (interstitial lung disease) ([place]) 06/25/2020   Myositis associated antibody positive 06/23/2020   High risk medication use 05/08/2020   Morbid obesity (  [place]) 04/30/2020   Prolactin increased 04/30/2020   Hypothyroidism 04/30/2020   Prediabetes 04/30/2020   Iron deficiency anemia 04/30/2020   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    Past Medical History:  Diagnosis Date   Anemia    Asthma    As a child   Myositis    Pre-diabetes    Prediabetes    Rhabdomyolysis     Thyroid disease    found at age 44    Family History  Problem Relation Age of Onset   Diabetes Mother    Diabetes Maternal Grandmother    Diabetes Paternal Grandmother    Stroke Paternal Grandmother    Stroke Paternal Grandfather    Diabetes Paternal Grandfather    Multiple sclerosis Cousin    Lupus Cousin    Past Surgical History:  Procedure Laterality Date   FOOT SURGERY Left 06/2019   MUSCLE BIOPSY Right 05/14/2020   Procedure: RIGHT THIGH MUSCLE BIOPSY;  Surgeon: Dwan Bolt, MD;  Location: [facility];  Service: General;  Laterality: Right;  ROOM 3 STARTING AT 04:00PM FOR 45MIN   Social History   Social History Narrative   Graduated from [employer] in sociology/toxicology   Starting day care job soon   Lives with boyfriend   No children   Immunization History  Administered Date(s) Administered   HPV 9-valent 01/31/2020   PFIZER(Purple Top)SARS-COV-2 Vaccination 04/17/2020   Tdap 03/17/2021     Objective: Vital Signs: BP 111/79 (BP Location: Left Arm, Patient Position: Sitting, Cuff Size: Large)    Pulse 84    Ht 5\' 5"  (1.651 m)    Wt 247 lb 12.8 oz (112.4 kg)    LMP 07/03/2020 (LMP Unknown)    BMI 41.24 kg/m    Physical Exam Constitutional:      Appearance: She is obese.  Cardiovascular:     Rate and Rhythm: Normal rate and regular rhythm.  Pulmonary:     Effort: Pulmonary effort is normal.     Breath sounds: Normal breath sounds.  Skin:    General: Skin is warm and dry.     Comments: Patchy areas on face with hyperpigmentation, erythematous patch on left side of nose  Neurological:     General: No focal deficit present.     Mental Status: She is alert.     Motor: No weakness.  Psychiatric:        Mood and Affect: Mood normal.     Musculoskeletal Exam:  Shoulders full ROM no tenderness or swelling Elbows full ROM no tenderness or swelling Wrists full ROM no tenderness or swelling Fingers full ROM no tenderness or swelling Knees full ROM no tenderness or  swelling   Investigation: No additional findings.  Imaging: No results found.  Recent Labs: Lab Results  Component Value Date   WBC 12.1 (H) 03/16/2021   HGB 10.7 (L) 03/16/2021   PLT 239 03/16/2021   NA 133 (L) 03/16/2021   K 4.3 03/16/2021   CL 103 03/16/2021   CO2 21 (L) 03/16/2021   GLUCOSE 92 03/17/2021   BUN 6 03/16/2021   CREATININE 0.84 03/16/2021   BILITOT 0.9 03/16/2021   ALKPHOS 87 03/16/2021   AST 33 03/16/2021   ALT 14 03/16/2021   PROT 7.0 03/16/2021   ALBUMIN 2.3 (L) 03/16/2021   CALCIUM 8.8 (L) 03/16/2021   GFRAA 153 07/30/2020    Speciality Comments: No specialty comments available.  Procedures:  No procedures performed Allergies: Patient has no known allergies.   Assessment /  Plan:     Visit Diagnoses: Polymyositis ([place]) - Plan: CK  Clinically doing well with no new symptoms or complaints and tolerating azathioprine and prednisone.  Recheck CK today if this continues downtrending we will plan to stop the prednisone and continue azathioprine 100 mg daily.  Left hip and thigh pain seems unrelated.  Recent CBC and CMP were checked due to her labor and delivery no problems for azathioprine tolerance.  Orders: Orders Placed This Encounter  Procedures   CK   No orders of the defined types were placed in this encounter.    Follow-Up Instructions: Return in about 3 months (around 07/16/2021) for PM on AZA f/u 46mos.   Collier Salina, MD  Note - This record has been created using [employer].  Chart creation errors have been sought, but may not always  have been located. Such creation errors do not reflect on  the standard of medical care.

## 2021-04-17 ENCOUNTER — Ambulatory Visit (INDEPENDENT_AMBULATORY_CARE_PROVIDER_SITE_OTHER): Payer: BLUE CROSS/BLUE SHIELD | Admitting: Internal Medicine

## 2021-04-17 ENCOUNTER — Encounter: Payer: Self-pay | Admitting: Internal Medicine

## 2021-04-17 ENCOUNTER — Other Ambulatory Visit: Payer: Self-pay

## 2021-04-17 VITALS — BP 111/79 | HR 84 | Ht 65.0 in | Wt 247.8 lb

## 2021-04-17 DIAGNOSIS — M3320 Polymyositis, organ involvement unspecified: Secondary | ICD-10-CM

## 2021-04-18 LAB — CK: Total CK: 1265 U/L — ABNORMAL HIGH (ref 29–143)

## 2021-04-28 ENCOUNTER — Ambulatory Visit (INDEPENDENT_AMBULATORY_CARE_PROVIDER_SITE_OTHER): Payer: BLUE CROSS/BLUE SHIELD | Admitting: Obstetrics & Gynecology

## 2021-04-28 ENCOUNTER — Other Ambulatory Visit: Payer: Self-pay

## 2021-04-28 DIAGNOSIS — Z30017 Encounter for initial prescription of implantable subdermal contraceptive: Secondary | ICD-10-CM

## 2021-04-28 DIAGNOSIS — R7303 Prediabetes: Secondary | ICD-10-CM

## 2021-04-28 LAB — POCT URINE PREGNANCY: Preg Test, Ur: NEGATIVE

## 2021-04-28 MED ORDER — ETONOGESTREL 68 MG ~~LOC~~ IMPL
68.0000 mg | DRUG_IMPLANT | Freq: Once | SUBCUTANEOUS | Status: AC
  Administered 2021-04-28: 68 mg via SUBCUTANEOUS

## 2021-04-28 NOTE — Progress Notes (Signed)
Patient ID: Rachael Patton, female   DOB: 2001/02/25, 21 y.o.   MRN: [id number] GYNECOLOGY OFFICE PROCEDURE NOTE  Rachael Patton is a 21 y.o. G1P1001 here for Nexplanon insertion.    Nexplanon Insertion Procedure Patient identified, informed consent performed, consent signed.   Patient does understand that irregular bleeding is a very common side effect of this medication. She was advised to have backup contraception for one week after placement. Pregnancy test in clinic today was negative.  Appropriate time out taken.  Patient's left arm was prepped and draped in the usual sterile fashion. The ruler used to measure and mark insertion area.  Patient was prepped with alcohol swab and then injected with 3 ml of 1% lidocaine.  She was prepped with betadine, Nexplanon removed from packaging,  Device confirmed in needle, then inserted full length of needle and withdrawn per handbook instructions. Nexplanon was able to palpated in the patient's arm; patient palpated the insert herself. There was minimal blood loss.  Patient insertion site covered with gauze and a pressure bandage to reduce any bruising.  The patient tolerated the procedure well and was given post procedure instructions.         Adam Phenix, MD Attending Obstetrician & Gynecologist, [facility] and Center for [facility]  04/28/2021

## 2021-04-28 NOTE — Progress Notes (Signed)
6 weeks PP, wants Nexplanon for [facility].  UPT today is Negative.  Administrations This Visit     etonogestrel (NEXPLANON) implant 68 mg     Admin Date 04/28/2021 Action Given Dose 68 mg Route Subdermal Administered By Maretta Bees, RMA                Post Partum Visit Note  Rachael Patton is a 21 y.o. G36P1001 female who presents for a postpartum visit. She is 6 weeks postpartum following a normal spontaneous vaginal delivery.  I have fully reviewed the prenatal and intrapartum course. The delivery was at 37.1 gestational weeks.  Anesthesia: epidural. Postpartum course has been unremarkable. Baby is doing well. Baby is feeding by bottle - Similac Neosure. Bleeding no bleeding. Bowel function is normal. Bladder function is normal. Patient is not sexually active. Contraception method is Nexplanon. Postpartum depression screening: negative.   Upstream - 04/28/21 1408       Pregnancy Intention Screening   Does the patient want to become pregnant in the next year? No    Does the patient's partner want to become pregnant in the next year? No    Would the patient like to discuss contraceptive options today? No      Contraception Wrap Up   Current Method Hormonal Implant    End Method Hormonal Implant            The pregnancy intention screening data noted above was reviewed. Potential methods of contraception were discussed. The patient elected to proceed with Hormonal Implant.   Edinburgh Postnatal Depression Scale - 04/28/21 1406       Edinburgh Postnatal Depression Scale:  In the Past 7 Days   I have been able to laugh and see the funny side of things. 0    I have looked forward with enjoyment to things. 0    I have blamed myself unnecessarily when things went wrong. 0    I have been anxious or worried for no good reason. 0    I have felt scared or panicky for no good reason. 0    Things have been getting on top of me. 0    I have been so unhappy that I have had  difficulty sleeping. 0    I have felt sad or miserable. 0    I have been so unhappy that I have been crying. 1    The thought of harming myself has occurred to me. 0    Edinburgh Postnatal Depression Scale Total 1             Health Maintenance Due  Topic Date Due   FOOT EXAM  Never done   OPHTHALMOLOGY EXAM  Never done   URINE MICROALBUMIN  Never done   HPV VACCINES (2 - Risk 3-dose series) 02/28/2020   COVID-19 Vaccine (2 - Pfizer risk series) 05/08/2020   HEMOGLOBIN A1C  04/23/2021    The following portions of the patient's history were reviewed and updated as appropriate: allergies, current medications, past family history, past medical history, past social history, past surgical history, and problem list.  Review of Systems Pertinent items are noted in HPI.  Objective:  LMP 07/03/2020 (LMP Unknown)    General:  alert, cooperative, and no distress   Breasts:    Lungs:   Heart:    Abdomen:    Wound N/a  GU exam:  not indicated       Assessment:   Postpartum state - Plan: POCT urine pregnancy,  etonogestrel (NEXPLANON) implant 68 mg  Nexplanon insertion  Prediabetes   Normal  postpartum exam.   Plan:   Essential components of care per ACOG recommendations:  1.  Mood and well being: Patient with negative depression screening today. Reviewed local resources for support.  - Patient tobacco use? No.   - hx of drug use? No.    2. Infant care and feeding:  -Patient currently breastmilk feeding? No.  -Social determinants of health (SDOH) reviewed in EPIC. No concerns  3. Sexuality, contraception and birth spacing - Patient does not want a pregnancy in the next year.  Desired family size is 1 children.  - Reviewed forms of contraception in tiered fashion. Patient desired  Nexplanon  today.   - Discussed birth spacing of 18 months  4. Sleep and fatigue -Encouraged family/partner/community support of 4 hrs of uninterrupted sleep to help with mood and  fatigue  5. Physical Recovery  - Discussed patients delivery and complications. She describes her labor as good. - Patient had a Vaginal, no problems at delivery. Patient had a 2nd degree laceration. Perineal healing reviewed. Patient expressed understanding - Patient has urinary incontinence? No. - Patient is safe to resume physical and sexual activity  6.  Health Maintenance - HM due items addressed Yes - Last pap smear No results found for: DIAGPAP Pap smear not done at today's visit.  -Breast Cancer screening indicated? No.   7. Chronic Disease/Pregnancy Condition follow up: GDM needs f/u 2 hr GTT  - PCP follow up See procedure note Adam Phenix, MD  Center for [facility], [facility]

## 2021-05-01 ENCOUNTER — Other Ambulatory Visit: Payer: Self-pay | Admitting: Internal Medicine

## 2021-05-01 DIAGNOSIS — M3320 Polymyositis, organ involvement unspecified: Secondary | ICD-10-CM

## 2021-05-19 ENCOUNTER — Other Ambulatory Visit: Payer: Self-pay | Admitting: Internal Medicine

## 2021-05-19 DIAGNOSIS — M3320 Polymyositis, organ involvement unspecified: Secondary | ICD-10-CM

## 2021-05-19 MED ORDER — PREDNISONE 5 MG PO TABS: 5.0000 mg | ORAL_TABLET | Freq: Every day | ORAL | 2 refills | Status: DC

## 2021-05-19 NOTE — Telephone Encounter (Signed)
Patient called the office requesting a refill of Prednisone 5mg  to be sent to [facility] on [address].

## 2021-05-19 NOTE — Telephone Encounter (Signed)
Next Visit: 07/17/2021  Last Visit: 04/17/2021  Last Fill: 12/03/2020  Dx: Polymyositis   Current Dose per office note on 04/17/2021: not discussed  Okay to refill Prednisone?

## 2021-06-08 ENCOUNTER — Other Ambulatory Visit: Payer: BLUE CROSS/BLUE SHIELD

## 2021-06-29 ENCOUNTER — Ambulatory Visit
Admission: RE | Admit: 2021-06-29 | Discharge: 2021-06-29 | Disposition: A | Discharge status: Home / Self Care | Payer: BLUE CROSS/BLUE SHIELD | Source: Ambulatory Visit | Attending: Pulmonary Disease | Admitting: Pulmonary Disease

## 2021-06-29 DIAGNOSIS — J849 Interstitial pulmonary disease, unspecified: Secondary | ICD-10-CM

## 2021-07-15 ENCOUNTER — Other Ambulatory Visit: Payer: Self-pay | Admitting: Physician Assistant

## 2021-07-16 NOTE — Progress Notes (Signed)
? ?Office Visit Note ? ?Patient: Rachael Patton             ?Date of Birth: 2000-07-15           ?MRN: [id number]             ?PCP: Inda Coke, PA ?Referring: Inda Coke, PA ?Visit Date: 07/17/2021 ? ? ?Subjective:  ?Follow-up (Cough) ? ? ?History of Present Illness: Rachael Patton is a 21 y.o. female here for follow up for polymyositis on azathioprine 100 mg daily and prednisone 5 mg daily. She has developed a persistent nonproductive cough again since our last visit. Not associated with shortness of breath or chest pain. She has eye dryness and itching. Facial hyperpigmented rashes are increased the areas on her arms are about the same. Rashes not particularly painful or itchy. She denies any weakness or mobility problem denies any swallowing problems. ? ?Previous HPI ?04/17/21 ?Rachael Patton is a 21 y.o. female here for follow up for polymyositis on azathioprine 100 mg PO daily and prednisone 5 mg daily. She deliver her daughter about a month ago without major complications. She slipped her left leg during laboring and felt pain in the anterior hip that has gradually improved over time. ?  ?Previous HPI: ?05/08/20 ?Rachael Patton is a 21 y.o. female with history of hypothyroidism, elevated prolactin here for follow up of hospitalization last week with proximal muscle weakness found to have highly elevated CK and myoglobinuria. This was treated with IV fluids and started steroid treatment with improvement in CK from 11,796 to 4,047. She was discharged on prednisone 80mg  per day dose. ?Prior to this she was ill with COVID a month ago. She was having some type of GI illness in December not clear if this was related to COVID infection or separate process. Workup with labs on 1/6 and 1/7 showing transaminitis also positive Ab tests for COVID19 and Hepatitis A. ?Since the hospital visit she feels her symptoms are partially improved. She notices continued leg swelling and has some dyspnea with lying supine and  on exertion. Skin rash on the face is slightly less warm and severe than before. She thinks strength is slightly better but not at her baseline at all. ? ? ?Review of Systems  ?Constitutional:  Negative for fatigue.  ?HENT:  Negative for mouth dryness.   ?Eyes:  Positive for dryness.  ?Respiratory:  Positive for cough.   ?Cardiovascular:  Negative for swelling in legs/feet.  ?Gastrointestinal:  Negative for constipation.  ?Endocrine: Negative for excessive thirst.  ?Genitourinary:  Negative for difficulty urinating.  ?Musculoskeletal:  Negative for morning stiffness.  ?Skin:  Positive for rash.  ?Allergic/Immunologic: Negative for susceptible to infections.  ?Neurological:  Negative for numbness.  ?Hematological:  Negative for bruising/bleeding tendency.  ?Psychiatric/Behavioral:  Negative for sleep disturbance.   ? ?PMFS History:  ?Patient Active Problem List  ? Diagnosis Date Noted  ? 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 Maternal obesity affecting pregnancy, antepartum 11/18/2020  ? Polymyositis ([place]) 07/18/2020  ? ILD (interstitial lung disease) ([place]) 06/25/2020  ? 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 High risk medication use 05/08/2020  ? Morbid obesity ([place]) 04/30/2020  ? Prolactin increased 04/30/2020  ? Hypothyroidism 04/30/2020  ? Prediabetes 04/30/2020  ? Iron deficiency anemia 04/30/2020  ? 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  ?Past Medical History:  ?Diagnosis Date  ? Anemia   ? Asthma   ? As a child  ? Myositis   ? Pre-diabetes   ?  Prediabetes   ? Rhabdomyolysis   ? Thyroid disease   ? found at age 1  ?  ?Family History  ?Problem Relation Age of Onset  ? Diabetes Mother   ? Diabetes Maternal Grandmother   ? Diabetes Paternal Grandmother   ? Stroke Paternal Grandmother   ? Stroke Paternal Grandfather   ? Diabetes Paternal Grandfather   ? Multiple sclerosis Cousin   ? Lupus Cousin   ? ?Past Surgical History:  ?Procedure Laterality Date  ? FOOT SURGERY Left 06/2019  ? MUSCLE BIOPSY  Right 05/14/2020  ? Procedure: RIGHT THIGH MUSCLE BIOPSY;  Surgeon: Dwan Bolt, MD;  Location: [place];  Service: General;  Laterality: Right;  ROOM 3 STARTING AT 04:00PM FOR 45MIN  ? ?Social History  ? ?Social History Narrative  ? Graduated from [employer] in sociology/toxicology  ? Starting day care job soon  ? Lives with boyfriend  ? No children  ? ?Immunization History  ?Administered Date(s) Administered  ? HPV 9-valent 01/31/2020  ? PFIZER(Purple Top)SARS-COV-2 Vaccination 04/17/2020  ? Tdap 03/17/2021  ?  ? ?Objective: ?Vital Signs: BP 106/74 (BP Location: Left Arm, Patient Position: Sitting, Cuff Size: Normal)   Pulse (!) 123   Resp 15   Ht 5\' 5"  (1.651 m)   Wt 243 lb (110.2 kg)   BMI 40.44 kg/m?   ? ?Physical Exam ?HENT:  ?   Mouth/Throat:  ?   Mouth: Mucous membranes are moist.  ?   Pharynx: Oropharynx is clear.  ?Eyes:  ?   Conjunctiva/sclera: Conjunctivae normal.  ?Cardiovascular:  ?   Rate and Rhythm: Regular rhythm. Tachycardia present.  ?Pulmonary:  ?   Effort: Pulmonary effort is normal.  ?   Breath sounds: Normal breath sounds.  ?Musculoskeletal:  ?   Right lower leg: No edema.  ?   Left lower leg: No edema.  ?Lymphadenopathy:  ?   Cervical: No cervical adenopathy.  ?Skin: ?   General: Skin is warm and dry.  ?   Comments: Flat hyperpigmented patches throughout face with some central clearing ?Chronic irregular hyperpigmented patches on bilateral arms  ?Neurological:  ?   Comments: Normal strength throughout  ?Psychiatric:     ?   Mood and Affect: Mood normal.  ?  ? ?Musculoskeletal Exam:  ?Shoulders full ROM no tenderness or swelling ?Elbows full ROM no tenderness or swelling ?Wrists full ROM no tenderness or swelling ?Fingers full ROM no tenderness or swelling ?Knees full ROM no tenderness or swelling ?Ankles full ROM no tenderness or swelling ? ? ?Investigation: ?No additional findings. ? ?Imaging: ?CT Chest High Resolution ? ?Result Date: 06/30/2021 ?CLINICAL DATA:  Interstitial lung disease EXAM:  CT CHEST WITHOUT CONTRAST TECHNIQUE: Multidetector CT imaging of the chest was performed following the standard protocol without intravenous contrast. High resolution imaging of the lungs, as well as inspiratory and expiratory imaging, was performed. RADIATION DOSE REDUCTION: This exam was performed according to the departmental dose-optimization program which includes automated exposure control, adjustment of the mA and/or kV according to patient size and/or use of iterative reconstruction technique. COMPARISON:  None. FINDINGS: Cardiovascular: Normal heart size. No pericardial effusion. No significant vascular findings. Mediastinum/Nodes: Thyroid and esophagus are unremarkable. Mildly enlarged left axillary nodes, likely reactive. Reference right axillary lymph node measuring 1.4 cm in short axis on series 2, image 40. Lungs/Pleura: Central airways are patent. No air trapping. Peribronchovascular ground-glass opacities which are most prominent in the mid and lower lungs with associated small thin-walled cysts. Mild traction bronchiolectasis  with no evidence honeycomb change. Upper Abdomen: No acute abnormality. Musculoskeletal: No chest wall mass or suspicious bone lesions identified. IMPRESSION: Peribronchovascular ground-glass opacities which are most prominent in the mid and lower lungs with associated small cysts. Favor lymphocytic interstitial pneumonia, NSIP is an additional consideration. Findings are suggestive of an alternative diagnosis (not UIP) per consensus guidelines: Diagnosis of Idiopathic Pulmonary Fibrosis: An Official ATS/ERS/JRS/ALAT Clinical Practice Guideline. [place], Iss 5, [phone], Nov 27 2016. Electronically Signed   By: Yetta Glassman M.D.   On: 06/30/2021 12:40   ? ?Recent Labs: ?Lab Results  ?Component Value Date  ? WBC 14.8 (H) 07/17/2021  ? HGB 12.4 07/17/2021  ? PLT 436 (H) 07/17/2021  ? NA 138 07/17/2021  ? K 4.4 07/17/2021  ? CL 102 07/17/2021  ? CO2  30 07/17/2021  ? GLUCOSE 123 (H) 07/17/2021  ? BUN 9 07/17/2021  ? CREATININE 0.80 07/17/2021  ? BILITOT 0.4 07/17/2021  ? ALKPHOS 87 03/16/2021  ? AST 36 (H) 07/17/2021  ? ALT 29 07/17/2021  ? PROT 8.3 (H)

## 2021-07-17 ENCOUNTER — Encounter: Payer: Self-pay | Admitting: Internal Medicine

## 2021-07-17 ENCOUNTER — Ambulatory Visit (INDEPENDENT_AMBULATORY_CARE_PROVIDER_SITE_OTHER): Payer: BLUE CROSS/BLUE SHIELD | Admitting: Internal Medicine

## 2021-07-17 VITALS — BP 106/74 | HR 123 | Resp 15 | Ht 65.0 in | Wt 243.0 lb

## 2021-07-17 DIAGNOSIS — Q998 Other specified chromosome abnormalities: Secondary | ICD-10-CM

## 2021-07-17 DIAGNOSIS — R768 Other specified abnormal immunological findings in serum: Secondary | ICD-10-CM

## 2021-07-17 DIAGNOSIS — M3320 Polymyositis, organ involvement unspecified: Secondary | ICD-10-CM | POA: Diagnosis not present

## 2021-07-17 DIAGNOSIS — J849 Interstitial pulmonary disease, unspecified: Secondary | ICD-10-CM

## 2021-07-17 DIAGNOSIS — R7689 Other specified abnormal immunological findings in serum: Secondary | ICD-10-CM

## 2021-07-17 DIAGNOSIS — Z79899 Other long term (current) drug therapy: Secondary | ICD-10-CM

## 2021-07-18 LAB — COMPLETE METABOLIC PANEL WITH GFR
AG Ratio: 0.8 (calc) — ABNORMAL LOW (ref 1.0–2.5)
ALT: 29 U/L (ref 6–29)
AST: 36 U/L — ABNORMAL HIGH (ref 10–30)
Albumin: 3.6 g/dL (ref 3.6–5.1)
Alkaline phosphatase (APISO): 100 U/L (ref 31–125)
BUN: 9 mg/dL (ref 7–25)
CO2: 30 mmol/L (ref 20–32)
Calcium: 9.2 mg/dL (ref 8.6–10.2)
Chloride: 102 mmol/L (ref 98–110)
Creat: 0.8 mg/dL (ref 0.50–0.96)
Globulin: 4.7 g/dL (calc) — ABNORMAL HIGH (ref 1.9–3.7)
Glucose, Bld: 123 mg/dL — ABNORMAL HIGH (ref 65–99)
Potassium: 4.4 mmol/L (ref 3.5–5.3)
Sodium: 138 mmol/L (ref 135–146)
Total Bilirubin: 0.4 mg/dL (ref 0.2–1.2)
Total Protein: 8.3 g/dL — ABNORMAL HIGH (ref 6.1–8.1)
eGFR: 107 mL/min/{1.73_m2} (ref >=60)

## 2021-07-18 LAB — CBC WITH DIFFERENTIAL/PLATELET
Absolute Monocytes: 1021 cells/uL — ABNORMAL HIGH (ref 200–950)
Basophils Absolute: 44 cells/uL (ref 0–200)
Basophils Relative: 0.3 %
Eosinophils Absolute: 266 cells/uL (ref 15–500)
Eosinophils Relative: 1.8 %
HCT: 39.4 % (ref 35.0–45.0)
Hemoglobin: 12.4 g/dL (ref 11.7–15.5)
Lymphs Abs: 962 cells/uL (ref 850–3900)
MCH: 25.6 pg — ABNORMAL LOW (ref 27.0–33.0)
MCHC: 31.5 g/dL — ABNORMAL LOW (ref 32.0–36.0)
MCV: 81.2 fL (ref 80.0–100.0)
MPV: 11.4 fL (ref 7.5–12.5)
Monocytes Relative: 6.9 %
Neutro Abs: 12506 cells/uL — ABNORMAL HIGH (ref 1500–7800)
Neutrophils Relative %: 84.5 %
Platelets: 436 10*3/uL — ABNORMAL HIGH (ref 140–400)
RBC: 4.85 10*6/uL (ref 3.80–5.10)
RDW: 15.8 % — ABNORMAL HIGH (ref 11.0–15.0)
Total Lymphocyte: 6.5 %
WBC: 14.8 10*3/uL — ABNORMAL HIGH (ref 3.8–10.8)

## 2021-07-18 LAB — CK: Total CK: 1226 U/L — ABNORMAL HIGH (ref 29–143)

## 2021-07-20 NOTE — Progress Notes (Signed)
CK test remains elevated about 1200, abnormal but similar to last time. I think we can keep the current medication dose until getting more information.

## 2021-07-22 ENCOUNTER — Other Ambulatory Visit: Payer: Self-pay | Admitting: *Deleted

## 2021-07-22 DIAGNOSIS — J849 Interstitial pulmonary disease, unspecified: Secondary | ICD-10-CM

## 2021-07-23 ENCOUNTER — Ambulatory Visit (INDEPENDENT_AMBULATORY_CARE_PROVIDER_SITE_OTHER): Payer: BLUE CROSS/BLUE SHIELD | Admitting: Pulmonary Disease

## 2021-07-23 DIAGNOSIS — J849 Interstitial pulmonary disease, unspecified: Secondary | ICD-10-CM | POA: Diagnosis not present

## 2021-07-23 LAB — PULMONARY FUNCTION TEST
DL/VA % pred: 99 %
DL/VA: 4.66 ml/min/mmHg/L
DLCO cor % pred: 47 %
DLCO cor: 11.36 ml/min/mmHg
DLCO unc % pred: 46 %
DLCO unc: 10.99 ml/min/mmHg
FEF 25-75 Post: 1.81 L/sec
FEF 25-75 Pre: 1.06 L/sec
FEF2575-%Change-Post: 70 %
FEF2575-%Pred-Post: 49 %
FEF2575-%Pred-Pre: 28 %
FEV1-%Change-Post: 14 %
FEV1-%Pred-Post: 48 %
FEV1-%Pred-Pre: 42 %
FEV1-Post: 1.49 L
FEV1-Pre: 1.3 L
FEV1FVC-%Change-Post: 16 %
FEV1FVC-%Pred-Pre: 86 %
FEV6-%Change-Post: -1 %
FEV6-%Pred-Post: 48 %
FEV6-%Pred-Pre: 49 %
FEV6-Post: 1.7 L
FEV6-Pre: 1.73 L
FEV6FVC-%Pred-Post: 100 %
FEV6FVC-%Pred-Pre: 100 %
FVC-%Change-Post: -1 %
FVC-%Pred-Post: 48 %
FVC-%Pred-Pre: 49 %
FVC-Post: 1.7 L
FVC-Pre: 1.73 L
Post FEV1/FVC ratio: 88 %
Post FEV6/FVC ratio: 100 %
Pre FEV1/FVC ratio: 75 %
Pre FEV6/FVC Ratio: 100 %
RV % pred: 61 %
RV: 0.82 L
TLC % pred: 49 %
TLC: 2.67 L

## 2021-07-23 NOTE — Patient Instructions (Signed)
Performed full PFT today. ?

## 2021-07-23 NOTE — Progress Notes (Signed)
Performed full PFT today. ?

## 2021-08-04 ENCOUNTER — Ambulatory Visit (INDEPENDENT_AMBULATORY_CARE_PROVIDER_SITE_OTHER): Payer: BLUE CROSS/BLUE SHIELD | Admitting: Pulmonary Disease

## 2021-08-04 ENCOUNTER — Encounter: Payer: Self-pay | Admitting: Pulmonary Disease

## 2021-08-04 DIAGNOSIS — M3320 Polymyositis, organ involvement unspecified: Secondary | ICD-10-CM | POA: Diagnosis not present

## 2021-08-04 DIAGNOSIS — J849 Interstitial pulmonary disease, unspecified: Secondary | ICD-10-CM | POA: Diagnosis not present

## 2021-08-04 MED ORDER — ALBUTEROL SULFATE HFA 108 (90 BASE) MCG/ACT IN AERS: 2.0000 | INHALATION_SPRAY | Freq: Four times a day (QID) | RESPIRATORY_TRACT | 2 refills | Status: DC | PRN

## 2021-08-04 NOTE — Progress Notes (Signed)
? ?  Subjective:  ? ? Patient ID: Rachael Patton, female    DOB: 2000-11-11, 21 y.o.   MRN: [id number] ? ?HPI ? ?21 yo never smoker with polymyositis and ILD ?SSA + ?  ?She presented with bilateral upper and lower extremity proximal muscle weakness for 3 months, hospitalized 04/2020 and was found to have elevated CK myoglobinuria.  She was treated with IV fluids and started on prednisone 80 mg/day.  CK improved from [id number].  She has established with rheumatology and prednisone has been gradually decreased to her current dose of 20 mg with a plan to decrease this by 5 mg every week ? 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  Anti-Jo antibody was negative ?  ?PMH -Covid infection January 2022 ? ?Chief Complaint  ?Patient presents with  ? Follow-up  ?  F/U with CT & PFT. Tightness in chest with pollen. Coughing on exertion.  ? ?She delivered at 37 weeks.  She has a 25-month-old baby now. ?Reviewed rheumatology consultation, she is maintained on 100 mg of Humira and 5 mg of prednisone.  Weight has come down to baseline to 48 pounds. ?CK was elevated at 1200 ?She reports dry cough and significant improvement after she took albuterol ?There is no skin rash, muscle aches have subsided ?She reports shortness of breath when walking long distances but is able to carry on routine activities.  She denies snoring or daytime somnolence. ?We reviewed HRCT and PFTs ? ?Significant tests/ events reviewed ?06/2021 HRCT Peribronchovascular ground-glass opacities which are most prominent in the mid and lower lungs with associated small cysts ? ?PFTs 06/2021 moderate to severe intraparenchymal restriction, ratio 75, FEV1 42%, 14% bronchodilator response, FVC 49%, TLC 49%, DLCO 10.99/46% ? ? ? ?Review of Systems ?neg for any significant sore throat, dysphagia, itching, sneezing, nasal congestion or excess/ purulent secretions,  fever, chills, sweats, unintended wt loss, pleuritic or exertional cp, hempoptysis, orthopnea pnd or change in chronic leg swelling. Also denies presyncope, palpitations, heartburn, abdominal pain, nausea, vomiting, diarrhea or change in bowel or urinary habits, dysuria,hematuria, rash, arthralgias, visual complaints, headache, numbness weakness or ataxia. ? ?   ?Objective:  ? Physical Exam ? ? ?Gen. Pleasant, obese, in no distress ?ENT - no lesions, no post nasal drip ?Neck: No JVD, no thyromegaly, no carotid bruits ?Lungs: no use of accessory muscles, no dullness to percussion, decreased without rales or rhonchi  ?Cardiovascular: Rhythm regular, heart sounds  normal, no murmurs or gallops, no peripheral edema ?Musculoskeletal: No deformities, no cyanosis or clubbing , no tremors ? ? ? ? ?   ?Assessment & Plan:  ? ? ?

## 2021-08-04 NOTE — Assessment & Plan Note (Signed)
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 ?She does have moderate intraparenchymal restriction on PFTs while TLC can be decreased due to obesity, DLCO is also significantly decreased.  We can follow this number for disease activity together with CK. ?Would need to increase Imuran and possibly consider alternative medication, will discuss with rheumatology ?Due to positive bronchodilator response we will provide her with albuterol ?

## 2021-08-04 NOTE — Patient Instructions (Addendum)
?  Inflammation is active in the lungs ? ?X Ambulatory sat ?

## 2021-08-04 NOTE — Assessment & Plan Note (Signed)
Per rheumatology, follow CK for disease activity while titrating medications. ?Periodically we will check DLCO ?

## 2021-08-04 NOTE — Addendum Note (Signed)
Addended by: Megan Salon C on: 08/04/2021 12:46 PM ? ? Modules accepted: Orders ? ?

## 2021-09-15 ENCOUNTER — Other Ambulatory Visit: Payer: Self-pay | Admitting: Internal Medicine

## 2021-09-15 DIAGNOSIS — M3320 Polymyositis, organ involvement unspecified: Secondary | ICD-10-CM

## 2021-09-16 NOTE — Telephone Encounter (Signed)
Next Visit: 11/06/2021  Last Visit: 07/17/2021  Last Fill:   DX: Polymyositis   Current Dose per office note 07/17/2021: azathioprine 100 mg daily and prednisone 5 mg daily   Labs: 07/17/2021 CK test remains elevated about 1200, abnormal but similar to last time. I think we can keep the current medication dose until getting more information.  Okay to refill Imuran and Prednisone?

## 2021-09-17 NOTE — Telephone Encounter (Signed)
I spoke with Rachael Patton about increasing azathioprine dose to 3 tablets daily keeping prednisone at 5 mg daily for now. Next follow up scheduled in August we can recheck after this adjustment.

## 2021-10-27 NOTE — Progress Notes (Unsigned)
Office Visit Note  Patient: Rachael Patton             Date of Birth: 06/26/2000           MRN: [id number]             PCP: [name], PA Referring: [name], PA Visit Date: 11/06/2021   Subjective:  Follow-up (Feeling better than last visit, eye swelling, left leg pain.)   History of Present Illness: Rachael Patton is a 21 y.o. female here for follow up for polymyositis.   Eyes ***  Previous HPI 07/17/2021 Rachael Patton is a 21 y.o. female here for follow up for polymyositis on azathioprine 100 mg daily and prednisone 5 mg daily. She has developed a persistent nonproductive cough again since our last visit. Not associated with shortness of breath or chest pain. She has eye dryness and itching. Facial hyperpigmented rashes are increased the areas on her arms are about the same. Rashes not particularly painful or itchy. She denies any weakness or mobility problem denies any swallowing problems.   Previous HPI 04/17/21 Rachael Patton is a 21 y.o. female here for follow up for polymyositis on azathioprine 100 mg PO daily and prednisone 5 mg daily. She deliver her daughter about a month ago without major complications. She slipped her left leg during laboring and felt pain in the anterior hip that has gradually improved over time.   Previous HPI: 05/08/20 Rachael Patton is a 21 y.o. female with history of hypothyroidism, elevated prolactin here for follow up of hospitalization last week with proximal muscle weakness found to have highly elevated CK and myoglobinuria. This was treated with IV fluids and started steroid treatment with improvement in CK from 11,796 to 4,047. She was discharged on prednisone 80mg  per day dose. Prior to this she was ill with COVID a month ago. She was having some type of GI illness in December not clear if this was related to COVID infection or separate process. Workup with labs on 1/6 and 1/7 showing transaminitis also positive Ab tests for  COVID19 and Hepatitis A. Since the hospital visit she feels her symptoms are partially improved. She notices continued leg swelling and has some dyspnea with lying supine and on exertion. Skin rash on the face is slightly less warm and severe than before. She thinks strength is slightly better but not at her baseline at all.     Review of Systems  Constitutional:  Positive for fatigue.  HENT:  Negative for mouth sores and mouth dryness.   Eyes:  Negative for dryness.  Respiratory:  Positive for shortness of breath.   Cardiovascular:  Negative for chest pain and palpitations.  Gastrointestinal:  Negative for blood in stool, constipation and diarrhea.  Endocrine: Negative for increased urination.  Genitourinary:  Negative for involuntary urination.  Musculoskeletal:  Positive for joint pain and joint pain. Negative for joint swelling, myalgias, muscle weakness, morning stiffness, muscle tenderness and myalgias.  Skin:  Positive for color change and rash. Negative for hair loss and sensitivity to sunlight.  Allergic/Immunologic: Negative for susceptible to infections.  Neurological:  Negative for dizziness and headaches.  Hematological:  Negative for swollen glands.  Psychiatric/Behavioral:  Positive for sleep disturbance. Negative for depressed mood. The patient is not nervous/anxious.     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    Past Medical History:  Diagnosis Date   Anemia    Asthma     As a child   Myositis    Pre-diabetes    Prediabetes    Rhabdomyolysis    Thyroid disease    found at age 67    Family History  Problem Relation Age of Onset   Multiple sclerosis Mother    Diabetes Mother    Diabetes Maternal Grandmother    Diabetes Paternal Grandmother    Stroke Paternal Grandmother    Stroke Paternal Grandfather    Diabetes Paternal Grandfather    Multiple sclerosis Cousin    Lupus Cousin    Past Surgical History:  Procedure Laterality Date   FOOT SURGERY Left 06/2019   MUSCLE BIOPSY Right 05/14/2020   Procedure: RIGHT THIGH MUSCLE BIOPSY;  Surgeon: Fritzi Mandes, MD;  Location: [facility] OR;  Service: General;  Laterality: Right;  ROOM 3 STARTING AT 04:00PM FOR   Social History   Social History Narrative   Graduated from [employer] in sociology/toxicology   Starting day care job soon   Lives with boyfriend   No children   Immunization History  Administered Date(s) Administered   HPV 9-valent 01/31/2020   PFIZER(Purple Top)SARS-COV-2 Vaccination 04/17/2020   Tdap 03/17/2021     Objective: Vital Signs: BP 106/72 (BP Location: Left Arm, Patient Position: Sitting, Cuff Size: Large)   Pulse 96   Resp 15   Ht 5\' 5"  (1.651 m)   Wt 245 lb 3.2 oz (111.2 kg)   LMP  (Within Months)   BMI 40.80 kg/m    Physical Exam   Musculoskeletal Exam: ***  CDAI Exam: CDAI Score: -- Patient Global: --; Provider Global: -- Swollen: --; Tender: -- Joint Exam 11/06/2021   No joint exam has been documented for this visit   There is currently no information documented on the homunculus. Go to the Rheumatology activity and complete the homunculus joint exam.  Investigation: No additional findings.  Imaging: No results found.  Recent Labs: Lab Results  Component Value Date   WBC 14.8 (H) 07/17/2021   HGB 12.4 07/17/2021   PLT 436 (H) 07/17/2021   NA 138 07/17/2021   K 4.4 07/17/2021   CL 102 07/17/2021   CO2 30 07/17/2021   GLUCOSE 123 (H) 07/17/2021    BUN 9 07/17/2021   CREATININE 0.80 07/17/2021   BILITOT 0.4 07/17/2021   ALKPHOS 87 03/16/2021   AST 36 (H) 07/17/2021   ALT 29 07/17/2021   PROT 8.3 (H) 07/17/2021   ALBUMIN 2.3 (L) 03/16/2021   CALCIUM 9.2 07/17/2021   GFRAA 153 07/30/2020    Speciality Comments: No specialty comments available.  Procedures:  No procedures performed Allergies: Patient has no known allergies.   Assessment / Plan:     Visit Diagnoses: Polymyositis (HCC)  Myositis associated antibody positive  SS-A antibody positive  ILD (interstitial lung disease) (HCC)  High risk medication use -  azathioprine 150 mg daily and prednisone 5 mg daily  ***  Orders: No orders of the defined types were placed in this encounter.  No orders of the defined types were placed in this encounter.    Follow-Up Instructions:  No follow-ups on file.   Fuller Plan, MD  Note - This record has been created using [employer].  Chart creation errors have been sought, but may not always  have been located. Such creation errors do not reflect on  the standard of medical care.

## 2021-11-06 ENCOUNTER — Encounter: Payer: Self-pay | Admitting: Internal Medicine

## 2021-11-06 ENCOUNTER — Ambulatory Visit: Payer: BLUE CROSS/BLUE SHIELD | Attending: Internal Medicine | Admitting: Internal Medicine

## 2021-11-06 VITALS — BP 106/72 | HR 96 | Resp 15 | Ht 65.0 in | Wt 245.2 lb

## 2021-11-06 DIAGNOSIS — R768 Other specified abnormal immunological findings in serum: Secondary | ICD-10-CM

## 2021-11-06 DIAGNOSIS — J849 Interstitial pulmonary disease, unspecified: Secondary | ICD-10-CM | POA: Diagnosis not present

## 2021-11-06 DIAGNOSIS — Z79899 Other long term (current) drug therapy: Secondary | ICD-10-CM

## 2021-11-06 DIAGNOSIS — M3320 Polymyositis, organ involvement unspecified: Secondary | ICD-10-CM | POA: Diagnosis not present

## 2021-11-06 DIAGNOSIS — Q998 Other specified chromosome abnormalities: Secondary | ICD-10-CM | POA: Diagnosis not present

## 2021-11-10 LAB — QUANTIFERON-TB GOLD PLUS
Mitogen-NIL: 0.04 IU/mL
NIL: 0.01 IU/mL
QuantiFERON-TB Gold Plus: UNDETERMINED — AB
TB1-NIL: 0 IU/mL
TB2-NIL: 0 IU/mL

## 2021-11-10 LAB — COMPLETE METABOLIC PANEL WITH GFR
AG Ratio: 0.8 (calc) — ABNORMAL LOW (ref 1.0–2.5)
ALT: 27 U/L (ref 6–29)
AST: 40 U/L — ABNORMAL HIGH (ref 10–30)
Albumin: 3.8 g/dL (ref 3.6–5.1)
Alkaline phosphatase (APISO): 96 U/L (ref 31–125)
BUN: 9 mg/dL (ref 7–25)
CO2: 29 mmol/L (ref 20–32)
Calcium: 8.9 mg/dL (ref 8.6–10.2)
Chloride: 104 mmol/L (ref 98–110)
Creat: 0.76 mg/dL (ref 0.50–0.96)
Globulin: 4.6 g/dL (calc) — ABNORMAL HIGH (ref 1.9–3.7)
Glucose, Bld: 114 mg/dL — ABNORMAL HIGH (ref 65–99)
Potassium: 4.6 mmol/L (ref 3.5–5.3)
Sodium: 138 mmol/L (ref 135–146)
Total Bilirubin: 0.3 mg/dL (ref 0.2–1.2)
Total Protein: 8.4 g/dL — ABNORMAL HIGH (ref 6.1–8.1)
eGFR: 113 mL/min/{1.73_m2} (ref >=60)

## 2021-11-10 LAB — CBC WITH DIFFERENTIAL/PLATELET
Absolute Monocytes: 495 cells/uL (ref 200–950)
Basophils Absolute: 64 cells/uL (ref 0–200)
Basophils Relative: 0.5 %
Eosinophils Absolute: 203 cells/uL (ref 15–500)
Eosinophils Relative: 1.6 %
HCT: 38.7 % (ref 35.0–45.0)
Hemoglobin: 12.4 g/dL (ref 11.7–15.5)
Lymphs Abs: 546 cells/uL — ABNORMAL LOW (ref 850–3900)
MCH: 26.9 pg — ABNORMAL LOW (ref 27.0–33.0)
MCHC: 32 g/dL (ref 32.0–36.0)
MCV: 83.9 fL (ref 80.0–100.0)
MPV: 11.5 fL (ref 7.5–12.5)
Monocytes Relative: 3.9 %
Neutro Abs: 11392 cells/uL — ABNORMAL HIGH (ref 1500–7800)
Neutrophils Relative %: 89.7 %
Platelets: 393 10*3/uL (ref 140–400)
RBC: 4.61 10*6/uL (ref 3.80–5.10)
RDW: 16 % — ABNORMAL HIGH (ref 11.0–15.0)
Total Lymphocyte: 4.3 %
WBC: 12.7 10*3/uL — ABNORMAL HIGH (ref 3.8–10.8)

## 2021-11-10 LAB — IGG, IGA, IGM
IgG (Immunoglobin G), Serum: 2623 mg/dL — ABNORMAL HIGH (ref 600–1640)
IgM, Serum: 165 mg/dL (ref 50–300)
Immunoglobulin A: 833 mg/dL — ABNORMAL HIGH (ref 47–310)

## 2021-11-10 LAB — CK: Total CK: 1442 U/L — ABNORMAL HIGH (ref 29–143)

## 2021-11-19 ENCOUNTER — Telehealth: Payer: Self-pay | Admitting: Internal Medicine

## 2021-11-19 DIAGNOSIS — M3320 Polymyositis, organ involvement unspecified: Secondary | ICD-10-CM

## 2021-11-19 MED ORDER — AZATHIOPRINE 50 MG PO TABS: 150.0000 mg | ORAL_TABLET | Freq: Every day | ORAL | 0 refills | Status: DC

## 2021-11-19 MED ORDER — PREDNISONE 20 MG PO TABS: 20.0000 mg | ORAL_TABLET | Freq: Every day | ORAL | 1 refills | Status: DC

## 2021-11-19 NOTE — Telephone Encounter (Signed)
Conversation documented in result note.

## 2021-11-19 NOTE — Progress Notes (Signed)
I spoke with Ms. Hedding results are consistent with continued myositis activity. The quantiferon is indeterminate probably because of immunosuppression due to negative control. Her AST is also slightly increased but may be from the elevated CK. I recommended increasing prednisone to 20 mg daily for now and planning to start rituximab treatment for longer term control.

## 2021-11-20 ENCOUNTER — Telehealth: Payer: Self-pay | Admitting: Pharmacist

## 2021-11-20 ENCOUNTER — Encounter: Payer: Self-pay | Admitting: Internal Medicine

## 2021-11-20 ENCOUNTER — Encounter: Payer: Self-pay | Admitting: Pharmacist

## 2021-11-20 DIAGNOSIS — Z111 Encounter for screening for respiratory tuberculosis: Secondary | ICD-10-CM

## 2021-11-20 NOTE — Telephone Encounter (Signed)
-----   Message from Fuller Plan, MD sent at 11/20/2021 12:33 PM EDT ----- Regarding: RE: Rituximab Yes to the 1000 mg x2 2 weeks apart, premedication 100 mg solumedrol, she can skin home prednisone on infusion date. Thanks. ----- Message ----- From: Murrell Redden, RPH-CPP Sent: 11/20/2021  12:03 PM EDT To: Fuller Plan, MD Subject: RE: Rituximab                                  Wanted to confirm dosing - we will do  1000mg  once every 2 weeks for 2 doses and repeat cycle every 6 months?  Pre-meds APAP 650mg  PO, Benadryl 25mg  PO. Do you want to pre-med with Solu-Medrol?  Thanks! Param Capri ----- Message ----- From: , MD Sent: 11/19/2021   4:21 PM EDT To: , RPH-CPP Subject: Rituximab                                      Recommending Ms. 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 Inadequate response to azathioprine for months. Dr. Fuller Plan agrees with treatment choice for her ILD. I briefly reviewed medication information and side effects but not a formal consent/document. Thanks.

## 2021-11-20 NOTE — Telephone Encounter (Addendum)
Error

## 2021-11-20 NOTE — Telephone Encounter (Addendum)
Referral placed to [facility] [address] [facility] for RITUXAN infusion  Dose: 1000mg  at Day 0 then Day 14. Repeat cycle every 6 months  Pre-medications: acetaminophen 650mg  tab and diphenhydramine 50mg  tab and Solu-Medrol 100mg  30-45 minutes prior to infusion  Labs: CBC w diff, CMP w GFR with every visit. TB gold once yearly  Dx: dermatomyositis with myositis, ILD  Patient had Nexplanon placed earlier this year and is not planning to become pregnant (added to med list). She is aware that Rituxan is not studied in pregnancy and she needs to notify providers if she is planning to become pregnant or becomes pregnant. Would require provider-patient joint decision on continuation of therapy.   Reviewed rituximab in detail - risk for immunosuppression, infusion-reaction, post-infusion symptoms, rare risk of PML. Reviewed that she should contact clinic if she has signs or symptoms of infection or is on antibiotics or has surgery scheduled when she is due for infusion. Reviewed the cycles and dosing. Reviewed importance of pre-medications for minimizing risk for infusion reaction. Discussed that infusion reactions are still possible and she should seek care for signs/symptoms of anaphylaxis. Reviewed possibility for arthralgia, tiredness, flu-like symptoms after infusions.   She confirms that she has BCBS and Medicaid as secondary.  Rituximab information sent to patient via MyChart  Patient verbalized understanding to all of the above. Questions encouraged and answered.  , PharmD, MPH, BCPS, CPP Clinical Pharmacist (Rheumatology and Pulmonology)

## 2021-11-20 NOTE — Telephone Encounter (Signed)
Error

## 2021-11-23 ENCOUNTER — Telehealth: Payer: Self-pay | Admitting: Pharmacy Technician

## 2021-11-23 NOTE — Telephone Encounter (Signed)
Auth Submission: PENDING Payer: BCBC Medication & CPT/J Code(s) submitted: Rituxan (Rituximab) [id number] Route of submission (phone, fax, portal):  Phone #[phone] Fax # Auth type: Buy/Bill Units/visits requested: X2 DOSES Reference number:  Approval from:  to  at [facility] as

## 2021-11-25 ENCOUNTER — Telehealth: Payer: Self-pay | Admitting: Pharmacy Technician

## 2021-11-25 NOTE — Telephone Encounter (Signed)
error 

## 2021-11-25 NOTE — Telephone Encounter (Addendum)
Auth Submission:APPROVED - Appeal has been approved.  Payer: [employer] Medication & CPT/J Code(s) submitted: Rituxan (Rituximab) [id number] Route of submission (phone, fax, portal): PHONE Phone #[phone] Fax #[phone] Auth type: Buy/Bill Units/visits requested:  Reference number: [id number] Approval from: 01/05/22 to 07/07/22  at [facility]    Appeal has been over turned (approved)   DX codes Q99.8, J84.9 and M33.20 are considered investigational or experimental.  Please call [phone] for appeal or peer to peer.

## 2021-11-26 NOTE — Telephone Encounter (Signed)
Will work on Rituxan appeal  Chesley Mires, PharmD, MPH, BCPS, CPP Clinical Pharmacist (Rheumatology and Pulmonology)

## 2021-12-01 NOTE — Telephone Encounter (Signed)
Spoke with patient regarding rituximab denial. She states we can complete member appeal authorization form on her behalf. Advised that we will submit as urgent but cannot guarantee that insurance will review urgently. Advised that we will reach out with update once received from insurance. She verbalized understanding to all of the above  Chesley Mires, PharmD, MPH, BCPS, CPP Clinical Pharmacist (Rheumatology and Pulmonology)

## 2021-12-17 ENCOUNTER — Other Ambulatory Visit: Payer: Self-pay | Admitting: Internal Medicine

## 2021-12-17 DIAGNOSIS — M3320 Polymyositis, organ involvement unspecified: Secondary | ICD-10-CM

## 2021-12-17 NOTE — Telephone Encounter (Signed)
Next Visit: 01/08/2022  Last Visit: 11/06/2021  Last Fill: 11/19/2021  Dx: Polymitosis  Current Dose per office note on 11/06/2021:  prednisone 5 mg daily   Okay to refill prednisone?

## 2021-12-18 ENCOUNTER — Encounter: Payer: Self-pay | Admitting: Internal Medicine

## 2021-12-22 NOTE — Telephone Encounter (Signed)
Submitted an URGENT appeal to  [employer]  for  RITUXIMAB .  Reference # [id number] Phone: [phone] Fax: [phone]  Knox Saliva, PharmD, MPH, BCPS, CPP Clinical Pharmacist (Rheumatology and Pulmonology)

## 2021-12-22 NOTE — Telephone Encounter (Signed)
Submitted an URGENT appeal to  [employer]  for  RITUXIMAB .  Reference # [id number] Phone: [phone] Fax: [phone]  Lenton Gendreau, PharmD, MPH, BCPS, CPP Clinical Pharmacist (Rheumatology and Pulmonology)  

## 2021-12-30 NOTE — Progress Notes (Addendum)
Office Visit Note  Patient: Rachael Patton             Date of Birth: 16-Jan-2001           MRN: [id number]             PCP: Inda Coke, PA Referring: Inda Coke, PA Visit Date: 01/08/2022   Subjective:  Follow-up (Doing good)   History of Present Illness: Rachael Patton is a 21 y.o. female here for follow up for polymyositis with ILD on azathioprine 150 mg daily and prednisone 20 mg daily. Since increasing the steroid dose her facial rashes are improving. She is gaining some weight on this. No particular coughing or shortness of breath complaint currently. No weakness or trouble walking or stairs.  Previous HPI 11/06/2021  Rachael Patton is a 21 y.o. female here for follow up for polymyositis on azathioprine 150 mg daily and prednisone 5 mg daily.  We increased her azathioprine dose after last visit due to elevated CK levels she had some generalized fatigue but no focal changes in muscle function at that time.  Pulmonology clinic follow-up concerning for deficits.  She thinks her fatigue or energy may be a little bit better but there is some increase in hyperpigmented facial rashes and some swelling around both eyes is new.  No visual changes or pain just notices the swelling   Previous HPI 07/17/2021 Rachael Patton is a 21 y.o. female here for follow up for polymyositis on azathioprine 100 mg daily and prednisone 5 mg daily. She has developed a persistent nonproductive cough again since our last visit. Not associated with shortness of breath or chest pain. She has eye dryness and itching. Facial hyperpigmented rashes are increased the areas on her arms are about the same. Rashes not particularly painful or itchy. She denies any weakness or mobility problem denies any swallowing problems.   Previous HPI 04/17/21 Rachael Patton is a 21 y.o. female here for follow up for polymyositis on azathioprine 100 mg PO daily and prednisone 5 mg daily. She deliver her daughter about a month  ago without major complications. She slipped her left leg during laboring and felt pain in the anterior hip that has gradually improved over time.   Previous HPI: 05/08/20 Rachael Patton is a 21 y.o. female with history of hypothyroidism, elevated prolactin here for follow up of hospitalization last week with proximal muscle weakness found to have highly elevated CK and myoglobinuria. This was treated with IV fluids and started steroid treatment with improvement in CK from 11,796 to 4,047. She was discharged on prednisone 80mg  per day dose. Prior to this she was ill with COVID a month ago. She was having some type of GI illness in December not clear if this was related to COVID infection or separate process. Workup with labs on 1/6 and 1/7 showing transaminitis also positive Ab tests for COVID19 and Hepatitis A. Since the hospital visit she feels her symptoms are partially improved. She notices continued leg swelling and has some dyspnea with lying supine and on exertion. Skin rash on the face is slightly less warm and severe than before. She thinks strength is slightly better but not at her baseline at all.   Review of Systems  Constitutional:  Negative for fatigue.  HENT:  Negative for mouth sores and mouth dryness.   Eyes:  Positive for dryness.  Respiratory:  Negative for shortness of breath.   Cardiovascular:  Negative for chest pain and palpitations.  Gastrointestinal:  Negative for  blood in stool, constipation and diarrhea.  Endocrine: Negative for increased urination.  Genitourinary:  Negative for involuntary urination.  Musculoskeletal:  Negative for joint pain, gait problem, joint pain, joint swelling, myalgias, muscle weakness, morning stiffness, muscle tenderness and myalgias.  Skin:  Negative for color change, rash, hair loss and sensitivity to sunlight.  Allergic/Immunologic: Positive for susceptible to infections.  Neurological:  Negative for dizziness and headaches.   Hematological:  Negative for swollen glands.  Psychiatric/Behavioral:  Negative for depressed mood and sleep disturbance. The patient is not nervous/anxious.     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    Past Medical History:  Diagnosis Date   Anemia    Asthma    As a child   Myositis    Pre-diabetes    Prediabetes    Rhabdomyolysis    Thyroid disease    found at age 48    Family History  Problem Relation Age of Onset   Multiple sclerosis Mother    Diabetes Mother    Diabetes Maternal Grandmother    Diabetes Paternal Grandmother    Stroke Paternal Grandmother    Stroke Paternal Grandfather    Diabetes Paternal Grandfather    Multiple sclerosis Cousin    Lupus Cousin    Past Surgical History:  Procedure Laterality Date   FOOT SURGERY Left 06/2019   MUSCLE BIOPSY Right 05/14/2020   Procedure: RIGHT THIGH MUSCLE BIOPSY;  Surgeon: Dwan Bolt, MD;  Location: [place];  Service: General;  Laterality: Right;  ROOM 3 STARTING AT 04:00PM FOR 45MIN   Social History   Social History Narrative   Graduated from [employer] in sociology/toxicology   Starting day care job soon   Lives with boyfriend   No children   Immunization History  Administered Date(s) Administered   HPV 9-valent 01/31/2020   PFIZER(Purple Top)SARS-COV-2 Vaccination 04/17/2020   Tdap 03/17/2021     Objective: Vital Signs: BP 113/77 (BP Location: Right Arm, Patient  Position: Sitting, Cuff Size: Normal)   Pulse (!) 108   Resp 15   Ht 5\' 5"  (1.651 m)   Wt 251 lb (113.9 kg)   BMI 41.77 kg/m    Physical Exam Constitutional:      Appearance: She is obese.  HENT:     Mouth/Throat:     Mouth: Mucous membranes are moist.     Pharynx: Oropharynx is clear.  Eyes:     Conjunctiva/sclera: Conjunctivae normal.  Cardiovascular:     Rate and Rhythm: Normal rate and regular rhythm.  Pulmonary:     Effort: Pulmonary effort is normal.     Breath sounds: Normal breath sounds.  Musculoskeletal:     Right lower leg: No edema.     Left lower leg: No edema.  Lymphadenopathy:     Cervical: No cervical adenopathy.  Skin:    General: Skin is warm and dry.     Findings: Rash present.  Neurological:     Mental Status: She is alert.  Psychiatric:  Mood and Affect: Mood normal.      Musculoskeletal Exam:  Elbows full ROM no tenderness or swelling Wrists full ROM no tenderness or swelling Fingers full ROM no tenderness or swelling Knees full ROM no tenderness or swelling Ankles full ROM no tenderness or swelling MTPs full ROM no tenderness or swelling   Investigation: No additional findings.  Imaging: No results found.  Recent Labs: Lab Results  Component Value Date   WBC 12.7 (H) 11/06/2021   HGB 12.4 11/06/2021   PLT 393 11/06/2021   NA 138 11/06/2021   K 4.6 11/06/2021   CL 104 11/06/2021   CO2 29 11/06/2021   GLUCOSE 114 (H) 11/06/2021   BUN 9 11/06/2021   CREATININE 0.76 11/06/2021   BILITOT 0.3 11/06/2021   ALKPHOS 87 03/16/2021   AST 40 (H) 11/06/2021   ALT 27 11/06/2021   PROT 8.4 (H) 11/06/2021   ALBUMIN 2.3 (L) 03/16/2021   CALCIUM 8.9 11/06/2021   GFRAA 153 07/30/2020   QFTBGOLDPLUS INDETERMINATE (A) 11/06/2021    Speciality Comments: No specialty comments available.  Procedures:  No procedures performed Allergies: Patient has no known allergies.   Assessment / Plan:     Visit Diagnoses: Polymyositis ([place]) -  Plan: CK  Symptoms appear improved today with 20 mg daily prednisone. Continuing azathioprine 150 mg daily as well. Will recheck CK this has been trending up along with increased symptoms before now. Rituximab appeal has been approved so hopefully can schedule infusion soon. Review medication risks and plan again today including infusion site reactions, infections and loss of vaccine efficacy, reactivation of chronic infection. Continuing at higher dose steroids until new treatment started.  ILD (interstitial lung disease) ([place])  Symptoms stable now, most recent pulmonology findings concerning for disease activity and this is more concerning findings versus skin and muscle which are mild.  High risk medication use - azathioprine 150 mg daily and prednisone 5 mg daily  - Plan: QuantiFERON-TB Gold Plus, COMPLETE METABOLIC PANEL WITH GFR, CBC with Differential/Platelet  Checking CBC and CMP for azathioprine and prednisone medication monitoring. Rechecking the indeterminate quantiferon, although may still be indeterminate on the higher prednisone. Is having some more unintentional weight gain with prednisone dose increased.  Orders: Orders Placed This Encounter  Procedures   CK   QuantiFERON-TB Gold Plus   COMPLETE METABOLIC PANEL WITH GFR   CBC with Differential/Platelet   Meds ordered this encounter  Medications   azaTHIOprine (IMURAN) 50 MG tablet    Sig: Take 3 tablets (150 mg total) by mouth daily.    Dispense:  270 tablet    Refill:  0   predniSONE (DELTASONE) 20 MG tablet    Sig: Take 1 tablet (20 mg total) by mouth daily with breakfast.    Dispense:  30 tablet    Refill:  1     Follow-Up Instructions: Return in about 3 months (around 04/10/2022) for PM/ILD RTX f/u 63mos.   Collier Salina, MD  Note - This record has been created using [employer].  Chart creation errors have been sought, but may not always  have been located. Such creation errors do not reflect on   the standard of medical care.

## 2022-01-07 NOTE — Telephone Encounter (Signed)
Called [employer] for update on RITUXIMAB appeal. Per rep, letter was sent out. Denial was OVERTURNED. Rituximab is APPROVED. Rep unable to provide approval dates. Letter was sent to office on 01/05/22 with standard mail, so many be receiving by next week. Will await physical copy of approval letter before scheduling patient at infusion center.  Case # [id number]  Call ref # [id number]  Phone: [phone], option 2  Knox Saliva, PharmD, MPH, BCPS, CPP Clinical Pharmacist (Rheumatology and Pulmonology)

## 2022-01-07 NOTE — Telephone Encounter (Signed)
Called [employer] for update on RITUXIMAB appeal. Per rep, letter was sent out. Denial was OVERTURNED. Rituximab is APPROVED. Rep unable to provide approval dates. Letter was sent to office on 01/05/22 with standard mail, so many be receiving by next week. Will await physical copy of approval letter before scheduling patient at infusion center. Once received, please fax to Obie Dredge at [facility] [employer] center [phone])  Case # [id number]  Call ref # [id number]  Phone: [phone], option 2  Knox Saliva, PharmD, MPH, BCPS, CPP Clinical Pharmacist (Rheumatology and Pulmonology)

## 2022-01-08 ENCOUNTER — Ambulatory Visit: Payer: BLUE CROSS/BLUE SHIELD | Attending: Internal Medicine | Admitting: Internal Medicine

## 2022-01-08 ENCOUNTER — Encounter: Payer: Self-pay | Admitting: Internal Medicine

## 2022-01-08 VITALS — BP 113/77 | HR 108 | Resp 15 | Ht 65.0 in | Wt 251.0 lb

## 2022-01-08 DIAGNOSIS — M3320 Polymyositis, organ involvement unspecified: Secondary | ICD-10-CM

## 2022-01-08 DIAGNOSIS — J849 Interstitial pulmonary disease, unspecified: Secondary | ICD-10-CM

## 2022-01-08 DIAGNOSIS — Z79899 Other long term (current) drug therapy: Secondary | ICD-10-CM | POA: Diagnosis not present

## 2022-01-08 MED ORDER — PREDNISONE 20 MG PO TABS: 20.0000 mg | ORAL_TABLET | Freq: Every day | ORAL | 1 refills | Status: DC

## 2022-01-08 MED ORDER — AZATHIOPRINE 50 MG PO TABS: 150.0000 mg | ORAL_TABLET | Freq: Every day | ORAL | 0 refills | Status: DC

## 2022-01-08 NOTE — Addendum Note (Signed)
Addended by: Collier Salina on: 01/08/2022 12:32 PM   Modules accepted: Orders

## 2022-01-13 LAB — COMPLETE METABOLIC PANEL WITH GFR
AG Ratio: 0.7 (calc) — ABNORMAL LOW (ref 1.0–2.5)
ALT: 11 U/L (ref 6–29)
AST: 15 U/L (ref 10–30)
Albumin: 3.5 g/dL — ABNORMAL LOW (ref 3.6–5.1)
Alkaline phosphatase (APISO): 91 U/L (ref 31–125)
BUN: 8 mg/dL (ref 7–25)
CO2: 26 mmol/L (ref 20–32)
Calcium: 9.1 mg/dL (ref 8.6–10.2)
Chloride: 104 mmol/L (ref 98–110)
Creat: 0.73 mg/dL (ref 0.50–0.96)
Globulin: 4.7 g/dL (calc) — ABNORMAL HIGH (ref 1.9–3.7)
Glucose, Bld: 131 mg/dL — ABNORMAL HIGH (ref 65–99)
Potassium: 4.3 mmol/L (ref 3.5–5.3)
Sodium: 139 mmol/L (ref 135–146)
Total Bilirubin: 0.2 mg/dL (ref 0.2–1.2)
Total Protein: 8.2 g/dL — ABNORMAL HIGH (ref 6.1–8.1)
eGFR: 118 mL/min/{1.73_m2} (ref >=60)

## 2022-01-13 LAB — QUANTIFERON-TB GOLD PLUS
Mitogen-NIL: 0.02 IU/mL
NIL: 0.06 IU/mL
QuantiFERON-TB Gold Plus: UNDETERMINED — AB
TB1-NIL: 0 IU/mL
TB2-NIL: <0 IU/mL

## 2022-01-13 LAB — CBC WITH DIFFERENTIAL/PLATELET
Absolute Monocytes: 830 cells/uL (ref 200–950)
Basophils Absolute: 97 cells/uL (ref 0–200)
Basophils Relative: 0.5 %
Eosinophils Absolute: 58 cells/uL (ref 15–500)
Eosinophils Relative: 0.3 %
HCT: 39.3 % (ref 35.0–45.0)
Hemoglobin: 13 g/dL (ref 11.7–15.5)
Lymphs Abs: 1042 cells/uL (ref 850–3900)
MCH: 29 pg (ref 27.0–33.0)
MCHC: 33.1 g/dL (ref 32.0–36.0)
MCV: 87.5 fL (ref 80.0–100.0)
MPV: 10.4 fL (ref 7.5–12.5)
Monocytes Relative: 4.3 %
Neutro Abs: 17274 cells/uL — ABNORMAL HIGH (ref 1500–7800)
Neutrophils Relative %: 89.5 %
Platelets: 469 10*3/uL — ABNORMAL HIGH (ref 140–400)
RBC: 4.49 10*6/uL (ref 3.80–5.10)
RDW: 16.6 % — ABNORMAL HIGH (ref 11.0–15.0)
Total Lymphocyte: 5.4 %
WBC: 19.3 10*3/uL — ABNORMAL HIGH (ref 3.8–10.8)

## 2022-01-13 LAB — CK: Total CK: 166 U/L — ABNORMAL HIGH (ref 29–143)

## 2022-01-14 NOTE — Telephone Encounter (Signed)
Received letter from [facility]. Rituximab is approved from 01/05/22 through 07/07/2022  Case # [id number]  Approval letter sent to media tab of patient's chart. Will notify Kim C with infusion center so she can complete her part of process so patient can be scheduled  Knox Saliva, PharmD, MPH, BCPS, CPP Clinical Pharmacist (Rheumatology and Pulmonology)

## 2022-01-18 NOTE — Telephone Encounter (Signed)
Notified patient of rituximab infusion approval. Advised her that [facility] will be reaching out to her this week to schedule infusion   Knox Saliva, PharmD, MPH, BCPS, CPP Clinical Pharmacist (Rheumatology and Pulmonology)

## 2022-01-20 ENCOUNTER — Ambulatory Visit (INDEPENDENT_AMBULATORY_CARE_PROVIDER_SITE_OTHER): Payer: BLUE CROSS/BLUE SHIELD | Admitting: Pulmonary Disease

## 2022-01-20 ENCOUNTER — Encounter: Payer: Self-pay | Admitting: Pulmonary Disease

## 2022-01-20 VITALS — BP 126/80 | HR 98 | Temp 98.1°F | Ht 65.0 in | Wt 256.4 lb

## 2022-01-20 DIAGNOSIS — Z23 Encounter for immunization: Secondary | ICD-10-CM

## 2022-01-20 DIAGNOSIS — J849 Interstitial pulmonary disease, unspecified: Secondary | ICD-10-CM | POA: Diagnosis not present

## 2022-01-20 NOTE — Progress Notes (Signed)
   Subjective:    Patient ID: Rachael Patton, female    DOB: [birth year], 21 y.o.   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  She was treated with IV fluids and started on prednisone 80 mg/day.  CK improved from [phone].  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  Anti-Jo antibody was negative   PMH -Covid infection January 2022  23-month follow-up visit. Reviewed rheumatology consultation, plan is to start Rituxan. She has gained weight with prednisone and developed puffiness around her eyes.  She reports an early morning cough that spontaneously resolves.  She reports shortness of breath only on coming stairs, can walk on level ground. Her baby is now 21 months old  Reviewed labs from October, with WBC count up to 19 K, CK decreased to 143. Denies sputum production or fevers or muscle aches  Significant tests/ events reviewed  06/2021 HRCT Peribronchovascular ground-glass opacities which are most prominent in the mid and lower lungs with associated small cysts   PFTs 06/2021 moderate to severe intraparenchymal restriction, ratio 75, FEV1 42%, 14% bronchodilator response, FVC 49%, TLC 49%, DLCO 10.99/46%  Review of Systems neg for any significant sore throat, dysphagia, itching, sneezing, nasal congestion or excess/ purulent secretions, fever, chills, sweats, unintended wt loss, pleuritic or exertional cp, hempoptysis, orthopnea pnd or change in chronic leg swelling. Also denies presyncope, palpitations, heartburn, abdominal pain, nausea, vomiting, diarrhea or change in  bowel or urinary habits, dysuria,hematuria, rash, arthralgias, visual complaints, headache, numbness weakness or ataxia.     Objective:   Physical Exam  Gen. Pleasant, obese, in no distress ENT - no lesions, no post nasal drip Neck: No JVD, no thyromegaly, no carotid bruits Lungs: no use of accessory muscles, no dullness to percussion, decreased without rales or rhonchi  Cardiovascular: Rhythm regular, heart sounds  normal, no murmurs or gallops, no peripheral edema Musculoskeletal: No deformities, no cyanosis or clubbing , no tremors       Assessment & Plan:

## 2022-01-20 NOTE — Assessment & Plan Note (Addendum)
Appears stable by symptoms. Rituxan is to be started and hopeful that after starting this, we can decrease her dose of prednisone. We will schedule high-resolution CT chest and PFTs in 6 months for disease progression Flu shot recommended today

## 2022-01-20 NOTE — Telephone Encounter (Signed)
First Rituxan infusion scheduled for 116/23. Will f/u to ensure completed  Knox Saliva, PharmD, MPH, BCPS, CPP Clinical Pharmacist (Rheumatology and Pulmonology)

## 2022-01-20 NOTE — Patient Instructions (Addendum)
X HRCT chest in April 2024  X PFTs in April 2024  X flu shot today

## 2022-01-25 ENCOUNTER — Other Ambulatory Visit: Payer: Self-pay | Admitting: Pharmacist

## 2022-01-25 DIAGNOSIS — M3320 Polymyositis, organ involvement unspecified: Secondary | ICD-10-CM

## 2022-01-25 DIAGNOSIS — J849 Interstitial pulmonary disease, unspecified: Secondary | ICD-10-CM

## 2022-01-25 DIAGNOSIS — Z79899 Other long term (current) drug therapy: Secondary | ICD-10-CM

## 2022-01-25 NOTE — Telephone Encounter (Signed)
Referral to ophthalmology placed today  Knox Saliva, PharmD, MPH, BCPS, CPP Clinical Pharmacist (Rheumatology and Pulmonology)

## 2022-01-25 NOTE — Telephone Encounter (Signed)
Yes I would like to place a referral. I think I may have forgotten her asking about that. Problem is swelling on the lateral upper sides of both eyes. Not urgent as she did not have visual changes or pain from this.

## 2022-01-25 NOTE — Progress Notes (Signed)
Referral to ophthalmology placed.  Reason for referral: Problem is swelling on the lateral upper sides of both eyes  Knox Saliva, PharmD, MPH, BCPS, CPP Clinical Pharmacist (Rheumatology and Pulmonology)

## 2022-01-25 NOTE — Progress Notes (Signed)
Lab results show a great improvement in her muscle inflammation on the higher medication doses. Hopefully we can decrease the prednisone once she starts the rituximab and still keep this good response. The repeated TB test is still indeterminate this is from the medications I am still not concerned about infection.

## 2022-01-25 NOTE — Telephone Encounter (Signed)
Called patient and advised her to skip prednisone on 02/01/22 since she will be premedicated with Solumedrol. She verbalized understanding  She inquired if Dr. Benjamine Mola had placed referral to ophthalmology as discussed as last office visit. Routing to Dr. Benjamine Mola - I don't see any referral in place but can place  Knox Saliva, PharmD, MPH, BCPS, CPP Clinical Pharmacist (Rheumatology and Pulmonology)

## 2022-02-01 ENCOUNTER — Ambulatory Visit (INDEPENDENT_AMBULATORY_CARE_PROVIDER_SITE_OTHER): Payer: BLUE CROSS/BLUE SHIELD

## 2022-02-01 VITALS — BP 104/72 | HR 81 | Temp 98.3°F | Resp 18 | Ht 65.0 in | Wt 258.0 lb

## 2022-02-01 DIAGNOSIS — Q998 Other specified chromosome abnormalities: Secondary | ICD-10-CM

## 2022-02-01 DIAGNOSIS — J849 Interstitial pulmonary disease, unspecified: Secondary | ICD-10-CM

## 2022-02-01 DIAGNOSIS — M3320 Polymyositis, organ involvement unspecified: Secondary | ICD-10-CM

## 2022-02-01 DIAGNOSIS — Z79899 Other long term (current) drug therapy: Secondary | ICD-10-CM | POA: Diagnosis not present

## 2022-02-01 MED ORDER — HEPARIN SOD (PORK) LOCK FLUSH 100 UNIT/ML IV SOLN: 250.0000 [IU] | Freq: Once | INTRAVENOUS | Status: DC | PRN

## 2022-02-01 MED ORDER — ANTICOAGULANT SODIUM CITRATE 4% (200MG/5ML) IV SOLN
5.0000 mL | Freq: Once | Status: DC | PRN
  Filled 2022-02-01: qty 5

## 2022-02-01 MED ORDER — ALTEPLASE 2 MG IJ SOLR: 2.0000 mg | Freq: Once | INTRAMUSCULAR | Status: DC | PRN

## 2022-02-01 MED ORDER — HEPARIN SOD (PORK) LOCK FLUSH 100 UNIT/ML IV SOLN: 500.0000 [IU] | Freq: Once | INTRAVENOUS | Status: DC | PRN

## 2022-02-01 MED ORDER — DIPHENHYDRAMINE HCL 25 MG PO CAPS
50.0000 mg | ORAL_CAPSULE | Freq: Once | ORAL | Status: AC
  Administered 2022-02-01: 50 mg via ORAL
  Filled 2022-02-01: qty 2

## 2022-02-01 MED ORDER — SODIUM CHLORIDE 0.9 % IV SOLN
1000.0000 mg | Freq: Once | INTRAVENOUS | Status: AC
  Administered 2022-02-01: 1000 mg via INTRAVENOUS
  Filled 2022-02-01: qty 100

## 2022-02-01 MED ORDER — ACETAMINOPHEN 325 MG PO TABS
650.0000 mg | ORAL_TABLET | Freq: Once | ORAL | Status: AC
  Administered 2022-02-01: 650 mg via ORAL
  Filled 2022-02-01: qty 2

## 2022-02-01 MED ORDER — SODIUM CHLORIDE 0.9% FLUSH: 3.0000 mL | Freq: Once | INTRAVENOUS | Status: DC | PRN

## 2022-02-01 MED ORDER — METHYLPREDNISOLONE SODIUM SUCC 125 MG IJ SOLR
125.0000 mg | Freq: Once | INTRAMUSCULAR | Status: AC
  Administered 2022-02-01: 125 mg via INTRAVENOUS
  Filled 2022-02-01: qty 2

## 2022-02-01 MED ORDER — SODIUM CHLORIDE 0.9% FLUSH: 10.0000 mL | Freq: Once | INTRAVENOUS | Status: DC | PRN

## 2022-02-01 NOTE — Progress Notes (Signed)
Diagnosis: Interstital Lung Disease  Provider:  Marshell Garfinkel MD  Procedure: Infusion  IV Type: Peripheral, IV Location: L Forearm  Rituxan (Rituximab), Dose: 1000 mg  Infusion Start Time: 9179  Infusion Stop Time: 1505  Post Infusion IV Care: Observation period completed and Peripheral IV Discontinued  Discharge: Condition: Good, Destination: Home . AVS provided to patient.   Performed by:  Arnoldo Morale, RN

## 2022-02-15 ENCOUNTER — Other Ambulatory Visit (INDEPENDENT_AMBULATORY_CARE_PROVIDER_SITE_OTHER): Payer: BLUE CROSS/BLUE SHIELD

## 2022-02-15 ENCOUNTER — Ambulatory Visit (INDEPENDENT_AMBULATORY_CARE_PROVIDER_SITE_OTHER): Payer: BLUE CROSS/BLUE SHIELD

## 2022-02-15 VITALS — BP 117/65 | HR 65 | Temp 98.0°F | Resp 18 | Ht 65.0 in | Wt 259.8 lb

## 2022-02-15 DIAGNOSIS — Q998 Other specified chromosome abnormalities: Secondary | ICD-10-CM | POA: Diagnosis not present

## 2022-02-15 DIAGNOSIS — M3320 Polymyositis, organ involvement unspecified: Secondary | ICD-10-CM | POA: Diagnosis not present

## 2022-02-15 DIAGNOSIS — J849 Interstitial pulmonary disease, unspecified: Secondary | ICD-10-CM

## 2022-02-15 DIAGNOSIS — Z79899 Other long term (current) drug therapy: Secondary | ICD-10-CM

## 2022-02-15 LAB — CBC WITH DIFFERENTIAL/PLATELET
Basophils Absolute: 0.1 10*3/uL (ref 0.0–0.1)
Basophils Relative: 0.5 % (ref 0.0–3.0)
Eosinophils Absolute: 0.1 10*3/uL (ref 0.0–0.7)
Eosinophils Relative: 0.8 % (ref 0.0–5.0)
HCT: 39.5 % (ref 36.0–46.0)
Hemoglobin: 12.6 g/dL (ref 12.0–15.0)
Lymphocytes Relative: 10.6 % — ABNORMAL LOW (ref 12.0–46.0)
Lymphs Abs: 1.2 10*3/uL (ref 0.7–4.0)
MCHC: 32 g/dL (ref 30.0–36.0)
MCV: 90.5 fl (ref 78.0–100.0)
Monocytes Absolute: 0.9 10*3/uL (ref 0.1–1.0)
Monocytes Relative: 8.3 % (ref 3.0–12.0)
Neutro Abs: 9.1 10*3/uL — ABNORMAL HIGH (ref 1.4–7.7)
Neutrophils Relative %: 79.8 % — ABNORMAL HIGH (ref 43.0–77.0)
Platelets: 340 10*3/uL (ref 150.0–400.0)
RBC: 4.36 Mil/uL (ref 3.87–5.11)
RDW: 16.5 % — ABNORMAL HIGH (ref 11.5–15.5)
WBC: 11.4 10*3/uL — ABNORMAL HIGH (ref 4.0–10.5)

## 2022-02-15 LAB — COMPREHENSIVE METABOLIC PANEL
ALT: 15 U/L (ref 0–35)
AST: 16 U/L (ref 0–37)
Albumin: 3.4 g/dL — ABNORMAL LOW (ref 3.5–5.2)
Alkaline Phosphatase: 97 U/L (ref 39–117)
BUN: 11 mg/dL (ref 6–23)
CO2: 29 mEq/L (ref 19–32)
Calcium: 8.8 mg/dL (ref 8.4–10.5)
Chloride: 103 mEq/L (ref 96–112)
Creatinine, Ser: 0.89 mg/dL (ref 0.40–1.20)
GFR: 91.32 mL/min (ref >60.00)
Glucose, Bld: 126 mg/dL — ABNORMAL HIGH (ref 70–99)
Potassium: 3.9 mEq/L (ref 3.5–5.1)
Sodium: 137 mEq/L (ref 135–145)
Total Bilirubin: 0.3 mg/dL (ref 0.2–1.2)
Total Protein: 7.5 g/dL (ref 6.0–8.3)

## 2022-02-15 MED ORDER — ACETAMINOPHEN 325 MG PO TABS
650.0000 mg | ORAL_TABLET | Freq: Once | ORAL | Status: AC
  Administered 2022-02-15: 650 mg via ORAL
  Filled 2022-02-15: qty 2

## 2022-02-15 MED ORDER — HEPARIN SOD (PORK) LOCK FLUSH 100 UNIT/ML IV SOLN: 500.0000 [IU] | Freq: Once | INTRAVENOUS | Status: DC | PRN

## 2022-02-15 MED ORDER — SODIUM CHLORIDE 0.9% FLUSH: 3.0000 mL | Freq: Once | INTRAVENOUS | Status: DC | PRN

## 2022-02-15 MED ORDER — SODIUM CHLORIDE 0.9% FLUSH: 10.0000 mL | Freq: Once | INTRAVENOUS | Status: DC | PRN

## 2022-02-15 MED ORDER — ALTEPLASE 2 MG IJ SOLR: 2.0000 mg | Freq: Once | INTRAMUSCULAR | Status: DC | PRN

## 2022-02-15 MED ORDER — ANTICOAGULANT SODIUM CITRATE 4% (200MG/5ML) IV SOLN
5.0000 mL | Freq: Once | Status: DC | PRN
  Filled 2022-02-15: qty 5

## 2022-02-15 MED ORDER — HEPARIN SOD (PORK) LOCK FLUSH 100 UNIT/ML IV SOLN: 250.0000 [IU] | Freq: Once | INTRAVENOUS | Status: DC | PRN

## 2022-02-15 MED ORDER — SODIUM CHLORIDE 0.9 % IV SOLN
1000.0000 mg | Freq: Once | INTRAVENOUS | Status: AC
  Administered 2022-02-15: 1000 mg via INTRAVENOUS
  Filled 2022-02-15: qty 100

## 2022-02-15 MED ORDER — METHYLPREDNISOLONE SODIUM SUCC 125 MG IJ SOLR
125.0000 mg | Freq: Once | INTRAMUSCULAR | Status: AC
  Administered 2022-02-15: 125 mg via INTRAVENOUS
  Filled 2022-02-15: qty 2

## 2022-02-15 MED ORDER — DIPHENHYDRAMINE HCL 25 MG PO CAPS
50.0000 mg | ORAL_CAPSULE | Freq: Once | ORAL | Status: AC
  Administered 2022-02-15: 50 mg via ORAL
  Filled 2022-02-15: qty 2

## 2022-02-15 NOTE — Progress Notes (Signed)
Diagnosis: Interstital Lung Disease  Provider:  Chilton Greathouse MD  Procedure: Infusion  IV Type: Peripheral, IV Location: R Antecubital  Rituxan (Rituximab), Dose: 1000 mg  Infusion Start Time: 1102  Infusion Stop Time: 1541  Post Infusion IV Care: Peripheral IV Discontinued  Discharge: Condition: Good, Destination: Home . AVS provided to patient.   Performed by:  Adriana Mccallum, RN

## 2022-03-31 ENCOUNTER — Encounter: Payer: Self-pay | Admitting: Internal Medicine

## 2022-03-31 ENCOUNTER — Emergency Department (HOSPITAL_BASED_OUTPATIENT_CLINIC_OR_DEPARTMENT_OTHER): Payer: Medicaid Other | Admitting: Radiology

## 2022-03-31 ENCOUNTER — Other Ambulatory Visit: Payer: Self-pay

## 2022-03-31 ENCOUNTER — Encounter (HOSPITAL_BASED_OUTPATIENT_CLINIC_OR_DEPARTMENT_OTHER): Payer: Self-pay

## 2022-03-31 DIAGNOSIS — R053 Chronic cough: Secondary | ICD-10-CM | POA: Diagnosis not present

## 2022-03-31 DIAGNOSIS — D72829 Elevated white blood cell count, unspecified: Secondary | ICD-10-CM | POA: Diagnosis not present

## 2022-03-31 DIAGNOSIS — R091 Pleurisy: Secondary | ICD-10-CM | POA: Diagnosis not present

## 2022-03-31 DIAGNOSIS — R079 Chest pain, unspecified: Secondary | ICD-10-CM | POA: Diagnosis present

## 2022-03-31 LAB — BASIC METABOLIC PANEL
Anion gap: 10 (ref 5–15)
BUN: 11 mg/dL (ref 6–20)
CO2: 25 mmol/L (ref 22–32)
Calcium: 9.4 mg/dL (ref 8.9–10.3)
Chloride: 102 mmol/L (ref 98–111)
Creatinine, Ser: 0.81 mg/dL (ref 0.44–1.00)
GFR, Estimated: >60 mL/min (ref >60)
Glucose, Bld: 212 mg/dL — ABNORMAL HIGH (ref 70–99)
Potassium: 4.3 mmol/L (ref 3.5–5.1)
Sodium: 137 mmol/L (ref 135–145)

## 2022-03-31 LAB — CBC
HCT: 38.9 % (ref 36.0–46.0)
Hemoglobin: 12.4 g/dL (ref 12.0–15.0)
MCH: 27.9 pg (ref 26.0–34.0)
MCHC: 31.9 g/dL (ref 30.0–36.0)
MCV: 87.6 fL (ref 80.0–100.0)
Platelets: 384 10*3/uL (ref 150–400)
RBC: 4.44 MIL/uL (ref 3.87–5.11)
RDW: 13.8 % (ref 11.5–15.5)
WBC: 14.8 10*3/uL — ABNORMAL HIGH (ref 4.0–10.5)
nRBC: 0 % (ref 0.0–0.2)

## 2022-03-31 LAB — TROPONIN I (HIGH SENSITIVITY): Troponin I (High Sensitivity): <2 ng/L (ref <18)

## 2022-03-31 NOTE — ED Triage Notes (Signed)
Patient here POV from Home.  Endorses mostly constant Left Sided CP that began 2 Days ago. Worsened since. Worse when coughing but no New cough. No Fevers. No N/V/D. No SOB.  NAD Noted during Triage. A&Ox4. Gcs 15. Ambulatory.

## 2022-04-01 ENCOUNTER — Emergency Department (HOSPITAL_BASED_OUTPATIENT_CLINIC_OR_DEPARTMENT_OTHER): Payer: Medicaid Other

## 2022-04-01 ENCOUNTER — Emergency Department (HOSPITAL_BASED_OUTPATIENT_CLINIC_OR_DEPARTMENT_OTHER)
Admission: EM | Admit: 2022-04-01 | Discharge: 2022-04-01 | Disposition: A | Discharge status: Home / Self Care | Payer: Medicaid Other | Attending: Emergency Medicine | Admitting: Emergency Medicine

## 2022-04-01 DIAGNOSIS — R091 Pleurisy: Secondary | ICD-10-CM

## 2022-04-01 LAB — HCG, SERUM, QUALITATIVE: Preg, Serum: NEGATIVE

## 2022-04-01 MED ORDER — IOHEXOL 350 MG/ML SOLN
100.0000 mL | Freq: Once | INTRAVENOUS | Status: AC | PRN
  Administered 2022-04-01: 100 mL via INTRAVENOUS

## 2022-04-01 MED ORDER — FENTANYL CITRATE PF 50 MCG/ML IJ SOSY
100.0000 ug | PREFILLED_SYRINGE | Freq: Once | INTRAMUSCULAR | Status: AC
  Administered 2022-04-01: 100 ug via INTRAVENOUS
  Filled 2022-04-01: qty 2

## 2022-04-01 NOTE — ED Provider Notes (Signed)
[facility] [place] EMERGENCY DEPT Provider Note   CSN: [id number] Arrival date & time: 03/31/22  2211     History  Chief Complaint  Patient presents with   Chest Pain    Rachael Patton is a 22 y.o. female.   Chest Pain Associated symptoms: no fever    Patient with history of interstitial lung disease not on chronic oxygen presents with left-sided chest pain.  Patient reports for the past 2 days she has have left lower chest pain worse with cough, movement and deep breathing.  When she lays flat the pain worsens throughout her chest and back.  No fevers or vomiting.  No increase shortness of breath.  She has a chronic cough due to her ILD, but this has not increased.  No hemoptysis.  No recent falls or trauma.  No flulike illness.  She is a non-smoker. Patient is controlled on Imuran, prednisone and Rituxan infusions    Home Medications Prior to Admission medications   Medication Sig Start Date End Date Taking? Authorizing Provider  acetaminophen (TYLENOL) 325 MG tablet Take 2 tablets (650 mg total) by mouth every 6 (six) hours as needed. 03/18/21   Dameron, Nolberto Hanlon, DO  albuterol (VENTOLIN HFA) 108 (90 Base) MCG/ACT inhaler Inhale 2 puffs into the lungs every 6 (six) hours as needed for wheezing or shortness of breath. 08/04/21   Oretha Milch, MD  azaTHIOprine (IMURAN) 50 MG tablet Take 3 tablets (150 mg total) by mouth daily. 01/08/22   Rice, Jamesetta Orleans, MD  Etonogestrel [facility]) Inject into the skin.    [provider]  ibuprofen (ADVIL) 600 MG tablet Take 1 tablet (600 mg total) by mouth every 6 (six) hours. Patient taking differently: Take 600 mg by mouth as needed. 03/18/21   Dameron, Nolberto Hanlon, DO  Iron, Ferrous Sulfate, 325 (65 Fe) MG TABS Take one tablet every other day 07/18/20   [name], PA  predniSONE (DELTASONE) 20 MG tablet Take 1 tablet (20 mg total) by mouth daily with breakfast. 01/08/22   Rice, Jamesetta Orleans, MD      Allergies     Patient has no known allergies.    Review of Systems   Review of Systems  Constitutional:  Negative for fever.  Cardiovascular:  Positive for chest pain.    Physical Exam Updated Vital Signs BP 132/70   Pulse (!) 52   Temp 97.6 F (36.4 C)   Resp (!) 25   Ht 1.651 m (5\' 5" )   Wt 117.8 kg   SpO2 100%   BMI 43.22 kg/m  Physical Exam CONSTITUTIONAL: Well developed/well nourished, no distress HEAD: Normocephalic/atraumatic EYES: EOMI/PERRL ENMT: Mucous membranes moist NECK: supple no meningeal signs SPINE/BACK:entire spine nontender CV: S1/S2 noted, no murmurs/rubs/gallops noted LUNGS: Lungs are clear to auscultation bilaterally, no apparent distress Chest-tenderness noted along the left lower costal margin, no erythema, no crepitus Family at bedside at patient request ABDOMEN: soft, nontender, no LUQ tenderness GU:no cva tenderness NEURO: Pt is awake/alert/appropriate, moves all extremitiesx4.  No facial droop.   EXTREMITIES: pulses normal/equal, full ROM SKIN: warm, color normal PSYCH: no abnormalities of mood noted, alert and oriented to situation  ED Results / Procedures / Treatments   Labs (all labs ordered are listed, but only abnormal results are displayed) Labs Reviewed  BASIC METABOLIC PANEL - Abnormal; Notable for the following components:      Result Value   Glucose, Bld 212 (*)    All other components within normal limits  CBC -  Abnormal; Notable for the following components:   WBC 14.8 (*)    All other components within normal limits  HCG, SERUM, QUALITATIVE  TROPONIN I (HIGH SENSITIVITY)    EKG EKG Interpretation  Date/Time:  Wednesday March 31 2022 22:25:49 EST Ventricular Rate:  76 PR Interval:  138 QRS Duration: 74 QT Interval:  386 QTC Calculation: 434 R Axis:   81 Text Interpretation: Sinus rhythm with marked sinus arrhythmia Cannot rule out Anterior infarct , age undetermined Abnormal ECG No previous ECGs available Confirmed by  Ripley Fraise [phone]) on 04/01/2022 4:14:00 AM  Radiology CT Angio Chest PE W and/or Wo Contrast  Result Date: 04/01/2022 CLINICAL DATA:  Left-sided chest pain began 2 days ago with worsening. Clinical concern for pulmonary embolus. EXAM: CT ANGIOGRAPHY CHEST WITH CONTRAST TECHNIQUE: Multidetector CT imaging of the chest was performed using the standard protocol during bolus administration of intravenous contrast. Multiplanar CT image reconstructions and MIPs were obtained to evaluate the vascular anatomy. RADIATION DOSE REDUCTION: This exam was performed according to the departmental dose-optimization program which includes automated exposure control, adjustment of the mA and/or kV according to patient size and/or use of iterative reconstruction technique. CONTRAST:  113mL OMNIPAQUE IOHEXOL 350 MG/ML SOLN COMPARISON:  High-resolution chest CT 06/29/2021 FINDINGS: Cardiovascular: The heart size is normal. No substantial pericardial effusion. No thoracic aortic aneurysm. No substantial atherosclerosis of the thoracic aorta. Aberrant origin right subclavian artery noted. There is no filling defect within the opacified pulmonary arteries to suggest the presence of an acute pulmonary embolus. Mediastinum/Nodes: No mediastinal lymphadenopathy. There is no hilar lymphadenopathy. The esophagus has normal imaging features. There is no axillary lymphadenopathy. Lungs/Pleura: As noted on the prior study, there are clustered/regional areas of thin walled air cysts with patchy ground-glass opacity bilaterally most notably in the mid and lower lungs. The ground-glass disease appears more confluent in the interval, notably in the right lower lobe. No findings suggest pulmonary edema or pleural effusion. Upper Abdomen: Unremarkable. Musculoskeletal: No worrisome lytic or sclerotic osseous abnormality. Review of the MIP images confirms the above findings. IMPRESSION: 1. No CT evidence for acute pulmonary embolus. 2.  Clustered/regional areas of thin walled air cysts with patchy ground-glass opacity bilaterally most notably in the mid and lower lungs. The ground-glass disease appears more confluent in the interval, notably in the right lower lobe. Imaging features are most suggestive of an infectious/inflammatory etiology with lymphocytic interstitial pneumonia and NSIP raised as considerations on previous high-resolution imaging. Electronically Signed   By: Misty Stanley M.D.   On: 04/01/2022 05:40   DG Chest 2 View  Result Date: 03/31/2022 CLINICAL DATA:  Chest pain.  Left-sided pain. EXAM: CHEST - 2 VIEW COMPARISON:  05/01/2020, chest CT 06/29/2021 FINDINGS: Persistent low lung volumes. Stable heart size and mediastinal contours. Subtle interstitial thickening in the lung bases corresponding to ground-glass opacities on prior CT. No confluent consolidation. No pneumothorax or pleural effusion. No pulmonary edema. No acute osseous findings. IMPRESSION: Persistent low lung volumes. Subtle interstitial thickening in the lung bases corresponding to ground-glass opacities/interstitial lung disease on prior CT. Electronically Signed   By: Keith Rake M.D.   On: 03/31/2022 22:42    Procedures Procedures    Medications Ordered in ED Medications  fentaNYL (SUBLIMAZE) injection 100 mcg (100 mcg Intravenous Given 04/01/22 0440)  iohexol (OMNIPAQUE) 350 MG/ML injection 100 mL (100 mLs Intravenous Contrast Given 04/01/22 0515)    ED Course/ Medical Decision Making/ A&P Clinical Course as of 04/01/22 8850  Thu Apr 01, 2022  0412 WBC(!): 14.8 Leukocytosis [DW]  0413 Glucose(!): 212 Hyperglycemia [DW]  0440 Patient is a poor historian, presents with left lower chest pain with cough and movement, but reports it worsens with lying flat and deep breathing into her back.  Will obtain CT chest to rule out PE or other acute findings.  I have low suspicion for ACS at this time [DW]  0612 Patient improved.  CT imaging not  reveal any rib fracture or PE.  She feels safe for discharge home.  CT did reveal findings of ILD which is already known. [DW]    Clinical Course User Index [DW] Ripley Fraise, MD                           Medical Decision Making Amount and/or Complexity of Data Reviewed Labs: ordered. Decision-making details documented in ED Course. Radiology: ordered.  Risk Prescription drug management.   This patient presents to the ED for concern of chest pain, this involves an extensive number of treatment options, and is a complaint that carries with it a high risk of complications and morbidity.  The differential diagnosis includes but is not limited to acute coronary syndrome, aortic dissection, pulmonary embolism, pericarditis, pneumothorax, pneumonia, myocarditis, pleurisy, esophageal rupture   Comorbidities that complicate the patient evaluation: Patient's presentation is complicated by their history of interstitial lung disease  Additional history obtained: Records reviewed  pulmonology notes reviewed  Lab Tests: I Ordered, and personally interpreted labs.  The pertinent results include: Leukocytosis and hyperglycemia  Imaging Studies ordered: I ordered imaging studies including X-ray chest   I independently visualized and interpreted imaging which showed chronic findings I agree with the radiologist interpretation  Cardiac Monitoring: The patient was maintained on a cardiac monitor.  I personally viewed and interpreted the cardiac monitor which showed an underlying rhythm of:  sinus rhythm  Medicines ordered and prescription drug management: I ordered medication including fentanyl for pain Reevaluation of the patient after these medicines showed that the patient    improved   Reevaluation: After the interventions noted above, I reevaluated the patient and found that they have :improved  Complexity of problems addressed: Patient's presentation is most consistent with  acute  presentation with potential threat to life or bodily function  Disposition: After consideration of the diagnostic results and the patient's response to treatment,  I feel that the patent would benefit from discharge   .           Final Clinical Impression(s) / ED Diagnoses Final diagnoses:  Pleurisy    Rx / DC Orders ED Discharge Orders     None         Ripley Fraise, MD 04/01/22 [phone]

## 2022-04-13 ENCOUNTER — Encounter: Payer: Self-pay | Admitting: Internal Medicine

## 2022-04-13 ENCOUNTER — Ambulatory Visit: Payer: Medicaid Other | Attending: Internal Medicine | Admitting: Internal Medicine

## 2022-04-13 VITALS — BP 109/70 | HR 64 | Resp 15 | Ht 65.0 in | Wt 265.0 lb

## 2022-04-13 DIAGNOSIS — Z79899 Other long term (current) drug therapy: Secondary | ICD-10-CM | POA: Diagnosis not present

## 2022-04-13 DIAGNOSIS — J849 Interstitial pulmonary disease, unspecified: Secondary | ICD-10-CM | POA: Diagnosis not present

## 2022-04-13 DIAGNOSIS — M3320 Polymyositis, organ involvement unspecified: Secondary | ICD-10-CM

## 2022-04-13 NOTE — Progress Notes (Signed)
Office Visit Note  Patient: Rachael Patton             Date of Birth: 18-May-2000           MRN: [id number]             PCP: [name], PA Referring: [name], PA Visit Date: 04/13/2022   Subjective:  Follow-up (Doing good)   History of Present Illness: Rachael Patton is a 22 y.o. 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 She had no complications with the treatment. No change of dyspnea or cough symptoms. She had a visit to the ED with some left sided chest pain and workup including CT at that time looked fine. She has noticed some more facial rash with skin peeling and dark areas under her eyes.   Previous HPI 01/08/22 Rachael Patton is a 22 y.o. female here for follow up for polymyositis with ILD on azathioprine 150 mg daily and prednisone 20 mg daily. Since increasing the steroid dose her facial rashes are improving. She is gaining some weight on this. No particular coughing or shortness of breath complaint currently. No weakness or trouble walking or stairs.   Previous HPI 11/06/2021  Rachael Patton is a 22 y.o. female here for follow up for polymyositis on azathioprine 150 mg daily and prednisone 5 mg daily.  We increased her azathioprine dose after last visit due to elevated CK levels she had some generalized fatigue but no focal changes in muscle function at that time.  Pulmonology clinic follow-up concerning for deficits.  She thinks her fatigue or energy may be a little bit better but there is some increase in hyperpigmented facial rashes and some swelling around both eyes is new.  No visual changes or pain just notices the swelling   Previous HPI 07/17/2021 Rachael Patton is a 22 y.o. female here for follow up for polymyositis on azathioprine 100 mg daily and prednisone 5 mg daily. She has developed a persistent nonproductive cough again since our last  visit. Not associated with shortness of breath or chest pain. She has eye dryness and itching. Facial hyperpigmented rashes are increased the areas on her arms are about the same. Rashes not particularly painful or itchy. She denies any weakness or mobility problem denies any swallowing problems.   Previous HPI 04/17/21 Rachael Patton is a 22 y.o. female here for follow up for polymyositis on azathioprine 100 mg PO daily and prednisone 5 mg daily. She deliver her daughter about a month ago without major complications. She slipped her left leg during laboring and felt pain in the anterior hip that has gradually improved over time.   Previous HPI: 05/08/20 Rachael Patton is a 22 y.o. female with history of hypothyroidism, elevated prolactin here for follow up of hospitalization last week with proximal muscle weakness found to have highly elevated CK and myoglobinuria. This was treated with IV fluids and started steroid treatment with improvement in CK from 11,796 to 4,047. She was discharged on prednisone 80mg  per day dose. Prior to this she was ill with COVID a month ago. She was having some type of GI illness in December not clear if this was related to COVID infection or separate process. Workup with labs on 1/6 and 1/7 showing transaminitis also positive Ab tests for COVID19 and Hepatitis A. Since the hospital visit she feels her symptoms are partially improved. She notices continued  leg swelling and has some dyspnea with lying supine and on exertion. Skin rash on the face is slightly less warm and severe than before. She thinks strength is slightly better but not at her baseline at all.     Review of Systems  Constitutional:  Negative for fatigue.  HENT:  Negative for mouth sores and mouth dryness.   Eyes:  Positive for dryness.  Respiratory:  Negative for shortness of breath.   Cardiovascular:  Negative for chest pain and palpitations.  Gastrointestinal:  Negative for blood in stool,  constipation and diarrhea.  Endocrine: Negative for increased urination.  Genitourinary:  Negative for involuntary urination.  Musculoskeletal:  Negative for joint pain, gait problem, joint pain, joint swelling, myalgias, muscle weakness, morning stiffness, muscle tenderness and myalgias.  Skin:  Positive for color change. Negative for rash, hair loss and sensitivity to sunlight.  Allergic/Immunologic: Positive for susceptible to infections.  Neurological:  Negative for dizziness and headaches.  Hematological:  Negative for swollen glands.  Psychiatric/Behavioral:  Negative for depressed mood and sleep disturbance. The patient is not nervous/anxious.     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    Past Medical History:  Diagnosis Date   Anemia    Asthma    As a child   Myositis    Pre-diabetes    Prediabetes    Rhabdomyolysis    Thyroid disease    found at age 54    Family History  Problem Relation Age of Onset   Multiple sclerosis Mother    Diabetes Mother    Diabetes Maternal Grandmother    Diabetes Paternal Grandmother    Stroke Paternal Grandmother    Stroke Paternal Grandfather    Diabetes Paternal Grandfather    Multiple sclerosis Cousin    Lupus Cousin    Past Surgical History:  Procedure Laterality Date   FOOT SURGERY Left 06/2019   MUSCLE BIOPSY Right 05/14/2020   Procedure: RIGHT THIGH MUSCLE BIOPSY;  Surgeon: Dwan Bolt,  MD;  Location: [place];  Service: General;  Laterality: Right;  ROOM 3 STARTING AT 04:00PM FOR 45MIN   Social History   Social History Narrative   Graduated from [employer] in sociology/toxicology   Starting day care job soon   Lives with boyfriend   No children   Immunization History  Administered Date(s) Administered   HPV 9-valent 01/31/2020   Influenza,inj,Quad PF,6+ Mos 01/20/2022   PFIZER(Purple Top)SARS-COV-2 Vaccination 04/17/2020   Tdap 03/17/2021     Objective: Vital Signs: BP 109/70 (BP Location: Left Arm, Patient Position: Sitting, Cuff Size: Normal)   Pulse 64   Resp 15   Ht 5\' 5"  (1.651 m)   Wt 265 lb (120.2 kg)   BMI 44.10 kg/m    Physical Exam Constitutional:      Appearance: She is obese.  Eyes:     Conjunctiva/sclera: Conjunctivae normal.  Cardiovascular:     Rate and Rhythm: Normal rate and regular rhythm.  Pulmonary:     Effort: Pulmonary  effort is normal.     Breath sounds: Normal breath sounds.  Lymphadenopathy:     Cervical: No cervical adenopathy.  Skin:    General: Skin is warm and dry.     Findings: Rash present.     Comments: Skin peeling changes on face, scalp, and ears Hyperpigmented flat patchy areas below eyes on both sides, no periorbital edema  Neurological:     Mental Status: She is alert.  Psychiatric:        Mood and Affect: Mood normal.      Musculoskeletal Exam:  Elbows full ROM no tenderness or swelling Wrists full ROM no tenderness or swelling Fingers full ROM no tenderness or swelling Knees full ROM no tenderness or swelling   Investigation: No additional findings.  Imaging: CT Angio Chest PE W and/or Wo Contrast  Result Date: 04/01/2022 CLINICAL DATA:  Left-sided chest pain began 2 days ago with worsening. Clinical concern for pulmonary embolus. EXAM: CT ANGIOGRAPHY CHEST WITH CONTRAST TECHNIQUE: Multidetector CT imaging of the chest was performed using the standard protocol during bolus administration of intravenous  contrast. Multiplanar CT image reconstructions and MIPs were obtained to evaluate the vascular anatomy. RADIATION DOSE REDUCTION: This exam was performed according to the departmental dose-optimization program which includes automated exposure control, adjustment of the mA and/or kV according to patient size and/or use of iterative reconstruction technique. CONTRAST:  165mL OMNIPAQUE IOHEXOL 350 MG/ML SOLN COMPARISON:  High-resolution chest CT 06/29/2021 FINDINGS: Cardiovascular: The heart size is normal. No substantial pericardial effusion. No thoracic aortic aneurysm. No substantial atherosclerosis of the thoracic aorta. Aberrant origin right subclavian artery noted. There is no filling defect within the opacified pulmonary arteries to suggest the presence of an acute pulmonary embolus. Mediastinum/Nodes: No mediastinal lymphadenopathy. There is no hilar lymphadenopathy. The esophagus has normal imaging features. There is no axillary lymphadenopathy. Lungs/Pleura: As noted on the prior study, there are clustered/regional areas of thin walled air cysts with patchy ground-glass opacity bilaterally most notably in the mid and lower lungs. The ground-glass disease appears more confluent in the interval, notably in the right lower lobe. No findings suggest pulmonary edema or pleural effusion. Upper Abdomen: Unremarkable. Musculoskeletal: No worrisome lytic or sclerotic osseous abnormality. Review of the MIP images confirms the above findings. IMPRESSION: 1. No CT evidence for acute pulmonary embolus. 2. Clustered/regional areas of thin walled air cysts with patchy ground-glass opacity bilaterally most notably in the mid and lower lungs. The ground-glass disease appears more confluent in the interval, notably in the right lower lobe. Imaging features are most suggestive of an infectious/inflammatory etiology with lymphocytic interstitial pneumonia and NSIP raised as considerations on previous high-resolution imaging.  Electronically Signed   By: Misty Stanley M.D.   On: 04/01/2022 05:40   DG Chest 2 View  Result Date: 03/31/2022 CLINICAL DATA:  Chest pain.  Left-sided pain. EXAM: CHEST - 2 VIEW COMPARISON:  05/01/2020, chest CT 06/29/2021 FINDINGS: Persistent low lung volumes. Stable heart size and mediastinal contours. Subtle interstitial thickening in the lung bases corresponding to ground-glass opacities on prior CT. No confluent consolidation. No pneumothorax or pleural effusion. No pulmonary edema. No acute osseous findings. IMPRESSION: Persistent low lung volumes. Subtle interstitial thickening in the lung bases corresponding to ground-glass opacities/interstitial lung disease on prior CT. Electronically Signed   By: Keith Rake M.D.   On: 03/31/2022 22:42    Recent Labs: Lab Results  Component Value Date   WBC 14.8 (H) 03/31/2022   HGB 12.4 03/31/2022   PLT  384 03/31/2022   NA 137 03/31/2022   K 4.3 03/31/2022   CL 102 03/31/2022   CO2 25 03/31/2022   GLUCOSE 212 (H) 03/31/2022   BUN 11 03/31/2022   CREATININE 0.81 03/31/2022   BILITOT 0.3 02/15/2022   ALKPHOS 97 02/15/2022   AST 16 02/15/2022   ALT 15 02/15/2022   PROT 7.5 02/15/2022   ALBUMIN 3.4 (L) 02/15/2022   CALCIUM 9.4 03/31/2022   GFRAA 153 07/30/2020   QFTBGOLDPLUS INDETERMINATE (A) 01/08/2022    Speciality Comments: No specialty comments available.  Procedures:  No procedures performed Allergies: Patient has no known allergies.   Assessment / Plan:     Visit Diagnoses: Polymyositis (HCC) - Plan: CK  No weakness or myalgia complaints. I am not sure if skin rash is related to underlying disease process, would be more concerned if labs indicate activity. Does not look typical for drug eruption. Checking CK level today. If improved next step will be aiming to taper prednisone to 5 mg daily continue azathioprine at 150 mg and rituximab for now.  ILD (interstitial lung disease) ([facility])  No new symptom complaints and  interval chest CT re demonstrates known changes. She has ongoing follow up with pulmonology clinic will need ongoing imaging or PFTs to guide management with not much symptom complaint.  High risk medication use - Plan: CBC with Differential/Platelet, COMPLETE METABOLIC PANEL WITH GFR  Checking CBC and CMP for medication monitoring ongoing rituximab and azathioprine. No significant interval infections. Prednisone tapering should hopefully help with weight going forward.  Orders: Orders Placed This Encounter  Procedures   CK   CBC with Differential/Platelet   COMPLETE METABOLIC PANEL WITH GFR   No orders of the defined types were placed in this encounter.    Follow-Up Instructions: Return in about 3 months (around 07/13/2022) for PM/ILD on RTX/AZA f/u 58mos.   Fuller Plan, MD  Note - This record has been created using [employer].  Chart creation errors have been sought, but may not always  have been located. Such creation errors do not reflect on  the standard of medical care.

## 2022-04-14 LAB — CBC WITH DIFFERENTIAL/PLATELET
Absolute Monocytes: 876 cells/uL (ref 200–950)
Basophils Absolute: 51 cells/uL (ref 0–200)
Basophils Relative: 0.4 %
Eosinophils Absolute: 102 cells/uL (ref 15–500)
Eosinophils Relative: 0.8 %
HCT: 37 % (ref 35.0–45.0)
Hemoglobin: 12.2 g/dL (ref 11.7–15.5)
Lymphs Abs: 800 cells/uL — ABNORMAL LOW (ref 850–3900)
MCH: 28.2 pg (ref 27.0–33.0)
MCHC: 33 g/dL (ref 32.0–36.0)
MCV: 85.6 fL (ref 80.0–100.0)
MPV: 11.8 fL (ref 7.5–12.5)
Monocytes Relative: 6.9 %
Neutro Abs: 10871 cells/uL — ABNORMAL HIGH (ref 1500–7800)
Neutrophils Relative %: 85.6 %
Platelets: 315 10*3/uL (ref 140–400)
RBC: 4.32 10*6/uL (ref 3.80–5.10)
RDW: 13.4 % (ref 11.0–15.0)
Total Lymphocyte: 6.3 %
WBC: 12.7 10*3/uL — ABNORMAL HIGH (ref 3.8–10.8)

## 2022-04-14 LAB — COMPLETE METABOLIC PANEL WITH GFR
AG Ratio: 1 (calc) (ref 1.0–2.5)
ALT: 6 U/L (ref 6–29)
AST: 10 U/L (ref 10–30)
Albumin: 3.7 g/dL (ref 3.6–5.1)
Alkaline phosphatase (APISO): 96 U/L (ref 31–125)
BUN: 9 mg/dL (ref 7–25)
CO2: 29 mmol/L (ref 20–32)
Calcium: 8.8 mg/dL (ref 8.6–10.2)
Chloride: 103 mmol/L (ref 98–110)
Creat: 0.77 mg/dL (ref 0.50–0.96)
Globulin: 3.8 g/dL (calc) — ABNORMAL HIGH (ref 1.9–3.7)
Glucose, Bld: 143 mg/dL — ABNORMAL HIGH (ref 65–99)
Potassium: 4.5 mmol/L (ref 3.5–5.3)
Sodium: 138 mmol/L (ref 135–146)
Total Bilirubin: 0.3 mg/dL (ref 0.2–1.2)
Total Protein: 7.5 g/dL (ref 6.1–8.1)
eGFR: 111 mL/min/{1.73_m2} (ref >=60)

## 2022-04-14 LAB — CK: Total CK: 122 U/L (ref 29–143)

## 2022-04-14 MED ORDER — PREDNISONE 5 MG PO TABS: ORAL_TABLET | ORAL | 0 refills | Status: DC

## 2022-04-14 MED ORDER — AZATHIOPRINE 50 MG PO TABS: 150.0000 mg | ORAL_TABLET | Freq: Every day | ORAL | 0 refills | Status: DC

## 2022-04-14 NOTE — Progress Notes (Signed)
Her CK level is down to 122 now which Is completely normal. I recommend she stay on the azathioprine and start decreasing prednisone. Go down to 15 mg for 1 week then 10 mg for 1 week then down to 5 mg and stay at that dose for now.  We can recheck to monitor for this at our next follow up, or she should let us know if having more symptoms in the meanwhile.  I am sending a new prescription for 5 mg tablets with these instructions.

## 2022-04-27 ENCOUNTER — Encounter: Payer: Self-pay | Admitting: Physician Assistant

## 2022-04-27 ENCOUNTER — Ambulatory Visit (INDEPENDENT_AMBULATORY_CARE_PROVIDER_SITE_OTHER): Payer: BC Managed Care – PPO | Admitting: Physician Assistant

## 2022-04-27 ENCOUNTER — Encounter: Payer: Self-pay | Admitting: Internal Medicine

## 2022-04-27 VITALS — BP 120/72 | HR 89 | Temp 97.7°F | Ht 65.0 in | Wt 263.0 lb

## 2022-04-27 DIAGNOSIS — R0781 Pleurodynia: Secondary | ICD-10-CM | POA: Diagnosis not present

## 2022-04-27 DIAGNOSIS — R21 Rash and other nonspecific skin eruption: Secondary | ICD-10-CM | POA: Diagnosis not present

## 2022-04-27 MED ORDER — CELECOXIB 50 MG PO CAPS: 50.0000 mg | ORAL_CAPSULE | Freq: Two times a day (BID) | ORAL | 1 refills | Status: AC | PRN

## 2022-04-27 MED ORDER — OMEPRAZOLE 20 MG PO CPDR: 20.0000 mg | DELAYED_RELEASE_CAPSULE | Freq: Every day | ORAL | 1 refills | Status: DC

## 2022-04-27 MED ORDER — GABAPENTIN 100 MG PO CAPS: ORAL_CAPSULE | ORAL | 1 refills | Status: AC

## 2022-04-27 NOTE — Patient Instructions (Addendum)
It was great to see you!  For your pain, trial as needed celebrex for your pain If you take celebrex, also take an omeprazole that day (omeprazole if also available over the counter if this is too expensive through your insurance)  If no improvement, trial the gabapentin Gabapentin can make you sleepy, so take at night Start 100 mg nightly and then increase by 100 mg up to 300 mg  Dermatology referral  Follow-up in 3-6 months for a physical  Take care,  Inda Coke PA-C

## 2022-04-27 NOTE — Progress Notes (Signed)
Rachael Patton is a 22 y.o. female here for a follow up of a pre-existing problem.  History of Present Illness:   Chief Complaint  Patient presents with   c/o left rib pain    Pt c/o left sided rib pain, went to UC on 1/3. Pt said when she coughs and takes a deep breath it hurts. Pt has been taking Tylenol with no relief. Denies fever or chills.    HPI  Left-sided rib pain Went to ER on 04/01/22 for pleurisy x 2 days. Had CXR --   IMPRESSION: Persistent low lung volumes. Subtle interstitial thickening in the lung bases corresponding to ground-glass opacities/interstitial lung disease on prior CT.  Had CTA,-- IMPRESSION: 1. No CT evidence for acute pulmonary embolus. 2. Clustered/regional areas of thin walled air cysts with patchy ground-glass opacity bilaterally most notably in the mid and lower lungs. The ground-glass disease appears more confluent in the interval, notably in the right lower lobe. Imaging features are most suggestive of an infectious/inflammatory etiology with lymphocytic interstitial pneumonia and NSIP raised as considerations on previous high-resolution imaging.  She has known ILD -- sees Dr Elsworth Soho. Last seen on 01/20/22 -- she was told to follow-up for CT and PFT in April 2024 to assess diease progression  She also sees Dr. Benjamine Mola with rheumatology -- currently prescribed 15 mg prednisone x 7 days and then 10 mg prednisone x 7 days, then 5 mg prednisone daily. She is taking this as prescribed.  Denies worsening sx. She has tried tylenol without relief of sx.   Rash on face Has ongoing rash on her face for several months. Has tried OTC cerave without relief of symptoms.  She is uncertain if this could be related to her underlying autoimmune disorder.    Past Medical History:  Diagnosis Date   Anemia    Asthma    As a child   Myositis    Prediabetes    Rhabdomyolysis    Thyroid disease    found at age 36     Social History   Tobacco Use    Smoking status: Never    Passive exposure: Never   Smokeless tobacco: Never  Vaping Use   Vaping Use: Never used  Substance Use Topics   Alcohol use: Yes    Comment: occasionaly   Drug use: Never    Past Surgical History:  Procedure Laterality Date   FOOT SURGERY Left 06/2019   MUSCLE BIOPSY Right 05/14/2020   Procedure: RIGHT THIGH MUSCLE BIOPSY;  Surgeon: Dwan Bolt, MD;  Location: [facility];  Service: General;  Laterality: Right;  ROOM 3 STARTING AT 04:00PM FOR 45MIN    Family History  Problem Relation Age of Onset   Multiple sclerosis Mother    Diabetes Mother    Diabetes Maternal Grandmother    Diabetes Paternal Grandmother    Stroke Paternal Grandmother    Stroke Paternal Grandfather    Diabetes Paternal Grandfather    Multiple sclerosis Cousin    Lupus Cousin     No Known Allergies  Current Medications:   Current Outpatient Medications:    acetaminophen (TYLENOL) 325 MG tablet, Take 2 tablets (650 mg total) by mouth every 6 (six) hours as needed., Disp: 30 tablet, Rfl: 0   albuterol (VENTOLIN HFA) 108 (90 Base) MCG/ACT inhaler, Inhale 2 puffs into the lungs every 6 (six) hours as needed for wheezing or shortness of breath., Disp: 8 g, Rfl: 2   azaTHIOprine (IMURAN) 50 MG tablet, Take 3  tablets (150 mg total) by mouth daily., Disp: 270 tablet, Rfl: 0   celecoxib (CELEBREX) 50 MG capsule, Take 1 capsule (50 mg total) by mouth 2 (two) times daily as needed for pain., Disp: 30 capsule, Rfl: 1   Etonogestrel (NEXPLANON [place]), Inject into the skin., Disp: , Rfl:    gabapentin (NEURONTIN) 100 MG capsule, Take 100 mg nightly, may increase by 100 mg increments up to 300 mg nightly., Disp: 30 capsule, Rfl: 1   ibuprofen (ADVIL) 600 MG tablet, Take 1 tablet (600 mg total) by mouth every 6 (six) hours. (Patient taking differently: Take 600 mg by mouth as needed.), Disp: 30 tablet, Rfl: 0   omeprazole (PRILOSEC) 20 MG capsule, Take 1 capsule (20 mg total) by mouth daily., Disp: 30  capsule, Rfl: 1   predniSONE (DELTASONE) 5 MG tablet, Take 15 mg daily for 7 days then 10 mg daily for 7 days then 5 mg daily, Disp: 90 tablet, Rfl: 0   Iron, Ferrous Sulfate, 325 (65 Fe) MG TABS, Take one tablet every other day (Patient not taking: Reported on 04/13/2022), Disp: 90 tablet, Rfl: 1   Review of Systems:   ROS Negative unless otherwise specified per HPI.  Vitals:   Vitals:   04/27/22 0946  BP: 120/72  Pulse: 89  Temp: 97.7 F (36.5 C)  TempSrc: Temporal  SpO2: 98%  Weight: 263 lb (119.3 kg)  Height: 5\' 5"  (1.651 m)     Body mass index is 43.77 kg/m.  Physical Exam:   Physical Exam Vitals and nursing note reviewed.  Constitutional:      General: She is not in acute distress.    Appearance: She is well-developed. She is not ill-appearing or toxic-appearing.  Cardiovascular:     Rate and Rhythm: Normal rate and regular rhythm.     Pulses: Normal pulses.     Heart sounds: Normal heart sounds, S1 normal and S2 normal.  Pulmonary:     Effort: Pulmonary effort is normal.     Breath sounds: Normal breath sounds.  Chest:     Comments: TTP under left breast on ribcage - no obvious deformity palpated Skin:    General: Skin is warm and dry.  Neurological:     Mental Status: She is alert.     GCS: GCS eye subscore is 4. GCS verbal subscore is 5. GCS motor subscore is 6.  Psychiatric:        Speech: Speech normal.        Behavior: Behavior normal. Behavior is cooperative.     Assessment and Plan:   Rash and nonspecific skin eruption Possibly related to underlying autoimmune disorders Referral to dermatology  Rib pain on left side No red flags on exam Reviewed ER work-up Will trial prn celebrex, discussed taking this sparingly given current prednisone use Discussed taking omeprazole when taking NSAID + steroid If this does not help, I also provided short course of gabapentin for her to trial -- start at 100 mg nightly and increase by 100 mg to goal of 300  mg nightly If lack of improvement or worsening, will refer to sports medicine  Follow-up in 3-6 months for CPE.     Inda Coke, PA-C

## 2022-05-12 LAB — HEMOGLOBIN A1C: A1c: 6.6

## 2022-05-19 ENCOUNTER — Other Ambulatory Visit: Payer: Self-pay | Admitting: Physician Assistant

## 2022-06-17 ENCOUNTER — Other Ambulatory Visit: Payer: Self-pay | Admitting: Pharmacy Technician

## 2022-06-17 ENCOUNTER — Other Ambulatory Visit: Payer: Self-pay | Admitting: Pharmacist

## 2022-06-17 NOTE — Progress Notes (Signed)
Orders renewed for [facility] for RITUXAN infusion. She is due on 08/02/2022 for next cycle of infusions   Dose: 1000mg  at Day 0 then Day 14. Repeat cycle every 6 months  Last doses: 02/01/2022 and 02/15/2022   Pre-medications: acetaminophen 650mg  tab and diphenhydramine 50mg  tab and Solu-Medrol 100mg  30-45 minutes prior to infusion   Labs: CBC w diff, CMP w GFR with every visit. TB gold once yearly   Dx: dermatomyositis with myositis, ILD  Knox Saliva, PharmD, MPH, BCPS, CPP Clinical Pharmacist (Rheumatology and Pulmonology)

## 2022-07-06 ENCOUNTER — Telehealth: Payer: Self-pay | Admitting: Pharmacy Technician

## 2022-07-06 NOTE — Telephone Encounter (Signed)
[employer] note:  Auth Submission: APPROVED Site of care: Site of care: [facility] Payer: [employer] Medication & CPT/J Code(s) submitted: Rituxan (Rituximab) [id number] Route of submission (phone, fax, portal):  Phone # [phone] Fax #[phone] Auth type: Buy/Bill Units/visits requested: 2 DOSES Reference number: [id number] Approval from: 07/06/22 - 03/29/23

## 2022-07-13 ENCOUNTER — Encounter: Payer: Self-pay | Admitting: Internal Medicine

## 2022-07-13 ENCOUNTER — Ambulatory Visit: Payer: BC Managed Care – PPO | Attending: Internal Medicine | Admitting: Internal Medicine

## 2022-07-13 VITALS — BP 107/73 | HR 75 | Resp 16 | Ht 65.0 in | Wt 263.0 lb

## 2022-07-13 DIAGNOSIS — M3320 Polymyositis, organ involvement unspecified: Secondary | ICD-10-CM | POA: Diagnosis not present

## 2022-07-13 DIAGNOSIS — R768 Other specified abnormal immunological findings in serum: Secondary | ICD-10-CM

## 2022-07-13 DIAGNOSIS — Z79899 Other long term (current) drug therapy: Secondary | ICD-10-CM | POA: Diagnosis not present

## 2022-07-13 DIAGNOSIS — J849 Interstitial pulmonary disease, unspecified: Secondary | ICD-10-CM

## 2022-07-13 LAB — CBC WITH DIFFERENTIAL/PLATELET
Basophils Absolute: 32 cells/uL (ref 0–200)
Eosinophils Absolute: 84 cells/uL (ref 15–500)
HCT: 36.4 % (ref 35.0–45.0)
Lymphs Abs: 515 cells/uL — ABNORMAL LOW (ref 850–3900)
MCH: 27.7 pg (ref 27.0–33.0)
MCHC: 33 g/dL (ref 32.0–36.0)
Neutrophils Relative %: 89.5 %
RBC: 4.33 10*6/uL (ref 3.80–5.10)
RDW: 14.7 % (ref 11.0–15.0)
Total Lymphocyte: 4.9 %

## 2022-07-13 NOTE — Progress Notes (Unsigned)
Office Visit Note  Patient: Rachael Patton             Date of Birth: Jan 29, 2001           MRN: [id number]             PCP: [name], PA Referring: [name], PA Visit Date: 07/13/2022   Subjective:  Follow-up   History of Present Illness: Rachael Patton is a 22 y.o. female here for follow up for myositis with associated ILD on azathioprine 150 mg daily prednisone 5 mg daily on rituximab infusion 1 g x 2 doses started in November.  Overall she feels well has not noticed any significant difference tapering the prednisone from 20 mg down to 5 mg daily.  No weakness.  No coughing or shortness of breath.  Skin rashes remain a problem particularly on the face and neck and she has appointment scheduled with dermatology coming up this week.  Previous HPI 04/13/22 Rachael Patton is a 22 y.o. 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 She had no complications with the treatment. No change of dyspnea or cough symptoms. She had a visit to the ED with some left sided chest pain and workup including CT at that time looked fine. She has noticed some more facial rash with skin peeling and dark areas under her eyes.     Previous HPI 01/08/22 Rachael Patton is a 22 y.o. female here for follow up for polymyositis with ILD on azathioprine 150 mg daily and prednisone 20 mg daily. Since increasing the steroid dose her facial rashes are improving. She is gaining some weight on this. No particular coughing or shortness of breath complaint currently. No weakness or trouble walking or stairs.   Previous HPI 11/06/2021  Rachael Patton is a 22 y.o. female here for follow up for polymyositis on azathioprine 150 mg daily and prednisone 5 mg daily.  We increased her azathioprine dose after last visit due to elevated CK levels she had some generalized fatigue but no focal changes in muscle  function at that time.  Pulmonology clinic follow-up concerning for deficits.  She thinks her fatigue or energy may be a little bit better but there is some increase in hyperpigmented facial rashes and some swelling around both eyes is new.  No visual changes or pain just notices the swelling   Previous HPI 07/17/2021 Rachael Patton is a 22 y.o. female here for follow up for polymyositis on azathioprine 100 mg daily and prednisone 5 mg daily. She has developed a persistent nonproductive cough again since our last visit. Not associated with shortness of breath or chest pain. She has eye dryness and itching. Facial hyperpigmented rashes are increased the areas on her arms are about the same. Rashes not particularly painful or itchy. She denies any weakness or mobility problem denies any swallowing problems.   Previous HPI 04/17/21 Rachael Patton is a 22 y.o. female here for follow up for polymyositis on azathioprine 100 mg PO daily and prednisone 5 mg daily. She deliver her daughter about a month ago without major complications. She slipped her left leg during laboring and felt pain in the anterior hip that has gradually improved over time.   Previous HPI: 05/08/20 Rachael Patton is a 22 y.o. female with history of hypothyroidism, elevated prolactin here for follow up of hospitalization last week with proximal muscle weakness found to have highly elevated  CK and myoglobinuria. This was treated with IV fluids and started steroid treatment with improvement in CK from 11,796 to 4,047. She was discharged on prednisone 80mg  per day dose. Prior to this she was ill with COVID a month ago. She was having some type of GI illness in December not clear if this was related to COVID infection or separate process. Workup with labs on 1/6 and 1/7 showing transaminitis also positive Ab tests for COVID19 and Hepatitis A. Since the hospital visit she feels her symptoms are partially improved. She notices continued leg  swelling and has some dyspnea with lying supine and on exertion. Skin rash on the face is slightly less warm and severe than before. She thinks strength is slightly better but not at her baseline at all.   Review of Systems  Constitutional:  Positive for fatigue.  HENT:  Negative for mouth sores and mouth dryness.   Eyes:  Positive for dryness.  Respiratory:  Negative for shortness of breath.   Cardiovascular:  Negative for chest pain and palpitations.  Gastrointestinal:  Negative for blood in stool, constipation and diarrhea.  Endocrine: Negative for increased urination.  Genitourinary:  Negative for involuntary urination.  Musculoskeletal:  Negative for joint pain, gait problem, joint pain, joint swelling, myalgias, muscle weakness, morning stiffness, muscle tenderness and myalgias.  Skin:  Positive for rash. Negative for color change, hair loss and sensitivity to sunlight.  Allergic/Immunologic: Positive for susceptible to infections.  Neurological:  Negative for dizziness and headaches.  Hematological:  Negative for swollen glands.  Psychiatric/Behavioral:  Negative for depressed mood and sleep disturbance. The patient is not nervous/anxious.     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    Past Medical History:  Diagnosis Date   Anemia    Asthma    As a child   Myositis    Prediabetes    Rhabdomyolysis    Thyroid disease    found at age 62    Family History  Problem Relation Age of Onset   Multiple sclerosis Mother     Diabetes Mother    Diabetes Maternal Grandmother    Diabetes Paternal Grandmother    Stroke Paternal Grandmother    Stroke Paternal Grandfather    Diabetes Paternal Grandfather    Multiple sclerosis Cousin    Lupus Cousin    Past Surgical History:  Procedure Laterality Date   FOOT SURGERY Left 06/2019   MUSCLE BIOPSY Right 05/14/2020   Procedure: RIGHT THIGH MUSCLE BIOPSY;  Surgeon: Fritzi Mandes, MD;  Location: [facility] OR;  Service: General;  Laterality: Right;  ROOM 3 STARTING AT 04:00PM FOR   Social History   Social History Narrative   Graduated from [employer] in sociology/toxicology   Starting day care job soon   Lives with boyfriend   No children   Immunization History  Administered Date(s) Administered   HPV 9-valent 01/31/2020   Influenza,inj,Quad PF,6+ Mos 01/20/2022   PFIZER(Purple Top)SARS-COV-2 Vaccination 04/17/2020   Tdap 03/17/2021     Objective: Vital Signs: BP 107/73 (BP Location: Right Arm, Patient  Position: Sitting, Cuff Size: Normal)   Pulse 75   Resp 16   Ht  (1.651 m)   Wt 263 lb (119.3 kg)   Breastfeeding No   BMI 43.77 kg/m    Physical Exam Constitutional:      Appearance: She is obese.  Cardiovascular:     Rate and Rhythm: Normal rate and regular rhythm.  Pulmonary:     Effort: Pulmonary effort is normal.     Breath sounds: Normal breath sounds.  Musculoskeletal:     Right lower leg: No edema.     Left lower leg: No edema.  Skin:    General: Skin is warm and dry.     Findings: Rash present.     Comments: Hyperpigmented skin rash throughout the face and along left side of the neck, mildly rough and shiny appearance without scaling  Neurological:     Mental Status: She is alert.  Psychiatric:        Mood and Affect: Mood normal.      Musculoskeletal Exam:  Shoulders full ROM no tenderness or swelling Elbows full ROM no tenderness or swelling Wrists full ROM no tenderness or swelling Fingers full ROM no tenderness or  swelling Knees full ROM no tenderness or swelling   Investigation: No additional findings.  Imaging: No results found.  Recent Labs: Lab Results  Component Value Date   WBC 10.5 07/13/2022   HGB 12.0 07/13/2022   PLT 389 07/13/2022   NA 137 07/13/2022   K 4.5 07/13/2022   CL 101 07/13/2022   CO2 30 07/13/2022   GLUCOSE 119 07/13/2022   BUN 11 07/13/2022   CREATININE 0.74 07/13/2022   BILITOT 0.4 07/13/2022   ALKPHOS 97 02/15/2022   AST 22 07/13/2022   ALT 10 07/13/2022   PROT 8.7 (H) 07/13/2022   ALBUMIN 3.4 (L) 02/15/2022   CALCIUM 9.5 07/13/2022   GFRAA 153 07/30/2020   QFTBGOLDPLUS INDETERMINATE (A) 01/08/2022    Speciality Comments: No specialty comments available.  Procedures:  No procedures performed Allergies: Patient has no known allergies.   Assessment / Plan:     Visit Diagnoses: Polymyositis - Plan: CK  No clinically apparent disease activity rechecking CK level today.  If this remains in normal range we will completely discontinue prednisone.  If inflammation increased after prednisone withdrawal consider switching azathioprine for mycophenolate.  Plan to continue azathioprine 150 mg daily and rituximab treatment at current dose.  ILD (interstitial lung disease)  Currently without symptomatic complaints but has mild radiographic changes and ongoing follow-up with Dr. Vassie Loll.  High risk medication use - Plan: CBC with Differential/Platelet, COMPLETE METABOLIC PANEL WITH GFR  Checking CBC and CMP for medication monitoring continuing on azathioprine.  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  Although skin changes have not not corresponded well with other disease activity or treatment.  Recommended her to ask upcoming dermatology appointment.   Orders: Orders Placed This Encounter  Procedures   CK   CBC with Differential/Platelet    COMPLETE METABOLIC PANEL WITH GFR   No orders of the defined types were placed in this encounter.    Follow-Up Instructions: Return in about 3 months (around 10/12/2022) for DM on RTX/AZA/GC f/u 3mos.   Fuller Plan, MD  Note - This record has been created using [employer].  Chart creation errors have been sought, but may not always  have  been located. Such creation errors do not reflect on  the standard of medical care.

## 2022-07-14 LAB — COMPLETE METABOLIC PANEL WITH GFR
AG Ratio: 0.8 (calc) — ABNORMAL LOW (ref 1.0–2.5)
ALT: 10 U/L (ref 6–29)
AST: 22 U/L (ref 10–30)
Albumin: 3.9 g/dL (ref 3.6–5.1)
Alkaline phosphatase (APISO): 100 U/L (ref 31–125)
BUN: 11 mg/dL (ref 7–25)
CO2: 30 mmol/L (ref 20–32)
Calcium: 9.5 mg/dL (ref 8.6–10.2)
Chloride: 101 mmol/L (ref 98–110)
Creat: 0.74 mg/dL (ref 0.50–0.96)
Globulin: 4.8 g/dL (calc) — ABNORMAL HIGH (ref 1.9–3.7)
Glucose, Bld: 119 mg/dL (ref 65–139)
Potassium: 4.5 mmol/L (ref 3.5–5.3)
Sodium: 137 mmol/L (ref 135–146)
Total Bilirubin: 0.4 mg/dL (ref 0.2–1.2)
Total Protein: 8.7 g/dL — ABNORMAL HIGH (ref 6.1–8.1)
eGFR: 117 mL/min/{1.73_m2} (ref >=60)

## 2022-07-14 LAB — CBC WITH DIFFERENTIAL/PLATELET
Absolute Monocytes: 473 cells/uL (ref 200–950)
Basophils Relative: 0.3 %
Eosinophils Relative: 0.8 %
Hemoglobin: 12 g/dL (ref 11.7–15.5)
MCV: 84.1 fL (ref 80.0–100.0)
MPV: 11.4 fL (ref 7.5–12.5)
Monocytes Relative: 4.5 %
Neutro Abs: 9398 cells/uL — ABNORMAL HIGH (ref 1500–7800)
Platelets: 389 10*3/uL (ref 140–400)
WBC: 10.5 10*3/uL (ref 3.8–10.8)

## 2022-07-14 LAB — CK: Total CK: 488 U/L — ABNORMAL HIGH (ref 29–143)

## 2022-07-26 NOTE — Progress Notes (Signed)
Subjective:    Rachael Patton is a 22 y.o. female and is here for a comprehensive physical exam.  HPI  Health Maintenance Due  Topic Date Due   OPHTHALMOLOGY EXAM  Never done   Diabetic kidney evaluation - Urine ACR  Never done   PAP-Cervical Cytology Screening  07/09/2022   PAP SMEAR-Modifier  07/09/2022    Acute Concerns: None.   Chronic Issues: Lung disease: She complains of central chest tightness and cough attacks with exertion. She reports these episodes tend to occur when cleaning or walking up the stairs to her apartment (lives on 2nd floor). It tends to take her a couple of minutes to rest before symptoms resolve.  She has been trying to stay more active and get out the house more. She is taking albuterol inhaler when needed, which has been helping.  She has been following up with Dr. Vassie Loll and has an appointment scheduled on 5/7.  She is compliant with 5 mg Prednisone.  Diabetes: Her last A1C was elevated. She has also gained weight and her BMI has increased.  She is receptive to managing with medications.  She states she was taking insulin during her pregnancy.  Lab Results  Component Value Date   HGBA1C 6.2 (H) 10/21/2020   Wt Readings from Last 3 Encounters:  07/27/22 267 lb 6.1 oz (121.3 kg)  07/13/22 263 lb (119.3 kg)  04/27/22 263 lb (119.3 kg)   Hypothyroidism Hx of hypothyroidism treated with levothyroxine in the past  Suture removal Has two sutures at posterior neck that need to be removed Saw dermatologist who did bx to determine lesion -- said results were benign  Health Maintenance: Immunizations -- UTD Colonoscopy -- N/A Mammogram -- N/A PAP -- UTD, requesting records Bone Density -- N/A Diet -- endorses poor diet Exercise -- Limitations when active due to lung disease, see HPI.   Sleep habits -- No concerns Mood -- Stable  UTD with dentist? - UTD UTD with eye doctor? - UTD  Weight history: Wt Readings from Last 10 Encounters:   07/27/22 267 lb 6.1 oz (121.3 kg)  07/13/22 263 lb (119.3 kg)  04/27/22 263 lb (119.3 kg)  04/13/22 265 lb (120.2 kg)  03/31/22 259 lb 11.2 oz (117.8 kg)  02/15/22 259 lb 12.8 oz (117.8 kg)  02/01/22 258 lb (117 kg)  01/20/22 256 lb 6.4 oz (116.3 kg)  01/08/22 251 lb (113.9 kg)  11/06/21 245 lb 3.2 oz (111.2 kg)   Body mass index is 44.49 kg/m. No LMP recorded. Patient has had an implant.  Alcohol use:  reports current alcohol use.  Tobacco use:  Tobacco Use: Low Risk  (07/27/2022)   Patient History    Smoking Tobacco Use: Never    Smokeless Tobacco Use: Never    Passive Exposure: Never   Eligible for lung cancer screening? no     07/27/2022    9:14 AM  Depression screen PHQ 2/9  Decreased Interest 0  Down, Depressed, Hopeless 0  PHQ - 2 Score 0  Altered sleeping 0  Tired, decreased energy 2  Change in appetite 0  Feeling bad or failure about yourself  0  Trouble concentrating 0  Moving slowly or fidgety/restless 0  Suicidal thoughts 0  PHQ-9 Score 2  Difficult doing work/chores Somewhat difficult     Other providers/specialists: Patient Care Team: [name], [place] as PCP - General (Physician Assistant) Ginette Otto, [facility] as Consulting Physician (Obstetrics and Gynecology) [facility], [facility] as Consulting  Physician (Rheumatology)    PMHx, SurgHx, SocialHx, Medications, and Allergies were reviewed in the Visit Navigator and updated as appropriate.   Past Medical History:  Diagnosis Date   Anemia    Asthma    As a child   Myositis    Prediabetes    Rhabdomyolysis    Thyroid disease    found at age 89     Past Surgical History:  Procedure Laterality Date   FOOT SURGERY Left 06/2019   MUSCLE BIOPSY Right 05/14/2020   Procedure: RIGHT THIGH MUSCLE BIOPSY;  Surgeon: Fritzi Mandes, MD;  Location: [facility] OR;  Service: General;  Laterality: Right;  ROOM 3 STARTING AT 04:00PM FOR     Family History  Problem Relation  Age of Onset   Multiple sclerosis Mother    Diabetes Mother    Diabetes Maternal Grandmother    Diabetes Paternal Grandmother    Stroke Paternal Grandmother    Stroke Paternal Grandfather    Diabetes Paternal Grandfather    Multiple sclerosis Cousin    Lupus Cousin     Social History   Tobacco Use   Smoking status: Never    Passive exposure: Never   Smokeless tobacco: Never  Vaping Use   Vaping Use: Never used  Substance Use Topics   Alcohol use: Yes    Comment: occasionaly   Drug use: Never    Review of Systems:   Review of Systems  Constitutional:  Negative for chills, fever, malaise/fatigue and weight loss.  HENT:  Negative for hearing loss, sinus pain and sore throat.   Respiratory:  Positive for cough (cough attacks with exertion). Negative for hemoptysis.   Cardiovascular:  Negative for chest pain, palpitations, leg swelling and PND.       (+) exertional chest tightness  Gastrointestinal:  Negative for abdominal pain, constipation, diarrhea, heartburn, nausea and vomiting.  Genitourinary:  Negative for dysuria, frequency and urgency.  Musculoskeletal:  Negative for back pain, myalgias and neck pain.  Skin:  Negative for itching and rash.  Neurological:  Negative for dizziness, tingling, seizures and headaches.  Endo/Heme/Allergies:  Negative for polydipsia.  Psychiatric/Behavioral:  Negative for depression. The patient is not nervous/anxious.     Objective:   BP 114/70 (BP Location: Right Arm, Patient Position: Sitting, Cuff Size: Large)   Pulse 91   Temp 98 F (36.7 C) (Temporal)   Ht 5\' 5"  (1.651 m)   Wt 267 lb 6.1 oz (121.3 kg)   SpO2 100%   Breastfeeding No   BMI 44.49 kg/m  Body mass index is 44.49 kg/m.   General Appearance:    Alert, cooperative, no distress, appears stated age  Head:    Normocephalic, without obvious abnormality, atraumatic  Eyes:    PERRL, conjunctiva/corneas clear, EOM's intact, fundi    benign, both eyes  Ears:    Normal  TM's and external ear canals, both ears  Nose:   Nares normal, septum midline, mucosa normal, no drainage    or sinus tenderness  Throat:   Lips, mucosa, and tongue normal; teeth and gums normal  Neck:   Supple, symmetrical, trachea midline, no adenopathy;    thyroid:  no enlargement/tenderness/nodules; no carotid   bruit or JVD  Back:     Symmetric, no curvature, ROM normal, no CVA tenderness  Lungs:     Clear to auscultation bilaterally, respirations unlabored  Chest Wall:    No tenderness or deformity   Heart:    Regular rate and rhythm, S1  and S2 normal, no murmur, rub or gallop  Breast Exam:    Deferred  Abdomen:     Soft, non-tender, bowel sounds active all four quadrants,    no masses, no organomegaly  Genitalia:    Deferred  Extremities:   Extremities normal, atraumatic, no cyanosis or edema  Pulses:   2+ and symmetric all extremities  Skin:   Skin color, texture, turgor normal, no rashes or lesions  Lymph nodes:   Cervical, supraclavicular, and axillary nodes normal  Neurologic:   CNII-XII intact, normal strength, sensation and reflexes    throughout    Patient presents for suture removal. The wound is well healed without signs of infection.  The two sutures are removed. Wound care and activity instructions given. Return prn.   Assessment/Plan:   Routine physical examination Today patient counseled on age appropriate routine health concerns for screening and prevention, each reviewed and up to date or declined. Immunizations reviewed and up to date or declined. Labs ordered and reviewed. Risk factors for depression reviewed and negative. Hearing function and visual acuity are intact. ADLs screened and addressed as needed. Functional ability and level of safety reviewed and appropriate. Education, counseling and referrals performed based on assessed risks today. Patient provided with a copy of personalized plan for preventive services.  Diabetes mellitus without complication  (HCC) New dx Recommend Mounjaro -- she is agreeable --  I will reach out to her rheumatologist to ensure that this is safe given her current medication regimen  Hypothyroidism, unspecified type Update TSH   Obesity Continue efforts at healthy lifestyle  ILD Recommend close follow-up with Dr Vassie Loll concerning her symptoms  Polymyositis Overall well controlled per patient report  Suture removal Tolerated well  I,Rachel Rivera,acting as a scribe for [employer], PA.,have documented all relevant documentation on the behalf of [name], PA,as directed by  [name], PA while in the presence of [name], [place].  I, [name], [place], have reviewed all documentation for this visit. The documentation on 07/27/22 for the exam, diagnosis, procedures, and orders are all accurate and complete.   [name], PA-C [facility]

## 2022-07-27 ENCOUNTER — Encounter: Payer: Self-pay | Admitting: Physician Assistant

## 2022-07-27 ENCOUNTER — Ambulatory Visit (INDEPENDENT_AMBULATORY_CARE_PROVIDER_SITE_OTHER): Payer: BC Managed Care – PPO | Admitting: Physician Assistant

## 2022-07-27 VITALS — BP 114/70 | HR 91 | Temp 98.0°F | Ht 65.0 in | Wt 267.4 lb

## 2022-07-27 DIAGNOSIS — Z4802 Encounter for removal of sutures: Secondary | ICD-10-CM

## 2022-07-27 DIAGNOSIS — J849 Interstitial pulmonary disease, unspecified: Secondary | ICD-10-CM

## 2022-07-27 DIAGNOSIS — M3320 Polymyositis, organ involvement unspecified: Secondary | ICD-10-CM | POA: Diagnosis not present

## 2022-07-27 DIAGNOSIS — E119 Type 2 diabetes mellitus without complications: Secondary | ICD-10-CM | POA: Diagnosis not present

## 2022-07-27 DIAGNOSIS — E039 Hypothyroidism, unspecified: Secondary | ICD-10-CM

## 2022-07-27 DIAGNOSIS — Z0000 Encounter for general adult medical examination without abnormal findings: Secondary | ICD-10-CM

## 2022-07-27 LAB — MICROALBUMIN / CREATININE URINE RATIO
Creatinine,U: 187 mg/dL
Microalb Creat Ratio: 0.5 mg/g (ref 0.0–30.0)
Microalb, Ur: 0.9 mg/dL (ref 0.0–1.9)

## 2022-07-27 LAB — LIPID PANEL
Cholesterol: 112 mg/dL (ref 0–200)
HDL: 31.4 mg/dL — ABNORMAL LOW (ref >39.00)
LDL Cholesterol: 70 mg/dL (ref 0–99)
NonHDL: 80.89
Total CHOL/HDL Ratio: 4
Triglycerides: 55 mg/dL (ref 0.0–149.0)
VLDL: 11 mg/dL (ref 0.0–40.0)

## 2022-07-27 LAB — TSH: TSH: 3.82 u[IU]/mL (ref 0.35–5.50)

## 2022-07-27 NOTE — Patient Instructions (Signed)
It was great to see you!  I am going to reach out to Dr [name] to see if he is in agreement to start [facility] -- I will be in touch with the plan!  Follow-up with lung doctor regarding your breathing issues!! If any worsening in the meantime, please go to the ER  Please go to the lab for blood work.   Our office will call you with your results unless you have chosen to receive results via MyChart.  If your blood work is normal we will follow-up each year for physicals and as scheduled for chronic medical problems.  If anything is abnormal we will treat accordingly and get you in for a follow-up.  Take care,  Lelon Mast

## 2022-08-02 ENCOUNTER — Ambulatory Visit (INDEPENDENT_AMBULATORY_CARE_PROVIDER_SITE_OTHER): Payer: BC Managed Care – PPO

## 2022-08-02 VITALS — BP 138/84 | HR 60 | Temp 98.8°F | Resp 18 | Ht 65.0 in | Wt 271.6 lb

## 2022-08-02 DIAGNOSIS — Z79899 Other long term (current) drug therapy: Secondary | ICD-10-CM | POA: Diagnosis not present

## 2022-08-02 DIAGNOSIS — M3320 Polymyositis, organ involvement unspecified: Secondary | ICD-10-CM

## 2022-08-02 DIAGNOSIS — J849 Interstitial pulmonary disease, unspecified: Secondary | ICD-10-CM

## 2022-08-02 DIAGNOSIS — Q998 Other specified chromosome abnormalities: Secondary | ICD-10-CM

## 2022-08-02 DIAGNOSIS — Z111 Encounter for screening for respiratory tuberculosis: Secondary | ICD-10-CM

## 2022-08-02 MED ORDER — SODIUM CHLORIDE 0.9 % IV SOLN
1000.0000 mg | Freq: Once | INTRAVENOUS | Status: AC
  Administered 2022-08-02: 1000 mg via INTRAVENOUS
  Filled 2022-08-02: qty 100

## 2022-08-02 MED ORDER — METHYLPREDNISOLONE SODIUM SUCC 125 MG IJ SOLR
100.0000 mg | Freq: Once | INTRAMUSCULAR | Status: AC
  Administered 2022-08-02: 100 mg via INTRAVENOUS
  Filled 2022-08-02: qty 2

## 2022-08-02 MED ORDER — ACETAMINOPHEN 325 MG PO TABS
650.0000 mg | ORAL_TABLET | Freq: Once | ORAL | Status: AC
  Administered 2022-08-02: 650 mg via ORAL
  Filled 2022-08-02: qty 2

## 2022-08-02 MED ORDER — DIPHENHYDRAMINE HCL 25 MG PO CAPS
50.0000 mg | ORAL_CAPSULE | Freq: Once | ORAL | Status: AC
  Administered 2022-08-02: 50 mg via ORAL
  Filled 2022-08-02: qty 2

## 2022-08-02 NOTE — Progress Notes (Signed)
Diagnosis: Interstital Lung Disease    Provider:  Chilton Greathouse MD  Procedure: IV Infusion  IV Type: Peripheral, IV Location: R Hand  Rituxan (Rituximab), Dose: 1000 mg  Infusion Start Time: 0952  Infusion Stop Time: 1327  Post Infusion IV Care: Peripheral IV Discontinued  Discharge: Condition: Good, Destination: Home . AVS Provided  Performed by:  Garnette Czech, RN

## 2022-08-03 ENCOUNTER — Ambulatory Visit (INDEPENDENT_AMBULATORY_CARE_PROVIDER_SITE_OTHER): Payer: BC Managed Care – PPO | Admitting: Pulmonary Disease

## 2022-08-03 ENCOUNTER — Encounter (HOSPITAL_BASED_OUTPATIENT_CLINIC_OR_DEPARTMENT_OTHER): Payer: Self-pay | Admitting: Pulmonary Disease

## 2022-08-03 VITALS — BP 114/80 | HR 64 | Temp 98.1°F | Ht 65.0 in | Wt 270.2 lb

## 2022-08-03 DIAGNOSIS — J849 Interstitial pulmonary disease, unspecified: Secondary | ICD-10-CM | POA: Diagnosis not present

## 2022-08-03 DIAGNOSIS — M3320 Polymyositis, organ involvement unspecified: Secondary | ICD-10-CM | POA: Diagnosis not present

## 2022-08-03 MED ORDER — BENZONATATE 200 MG PO CAPS: 200.0000 mg | ORAL_CAPSULE | Freq: Two times a day (BID) | ORAL | 1 refills | Status: DC | PRN

## 2022-08-03 NOTE — Patient Instructions (Addendum)
X Amb sat  x schedule PFTs next available x high-resolution CT chest in October  Okay to use Delsym 5 ml twice daily as needed for cough x benzonatate 200 mg twice daily as needed for cough

## 2022-08-03 NOTE — Assessment & Plan Note (Signed)
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 Clinically this appears to be CT ILD Being treated by rheumatology with Rituxan, azathioprine and prednisone has been decreased to 5 mg with plan to taper off completely.  She does have a residual cough unclear if this is worse now due to pollen season monitor obtain PFTs to quantify lung function.  She did not desaturate on exertion which is reassuring.  CT angiogram from January does not show significant worsening.  We will reassess with high-resolution CT chest in 6 months. Also advised symptomatic treatment for cough Delsym and benzonatate

## 2022-08-03 NOTE — Assessment & Plan Note (Signed)
Being treated by rheumatology with Rituxan and Imuran.  Monitor CBC and LFTs

## 2022-08-03 NOTE — Progress Notes (Unsigned)
   Subjective:    Patient ID: [name], female    DOB: [birth year], 22 y.o.   MRN: [id number]  HPI  22 yo never smoker with polymyositis and ILD/ NSIP SSA +   She was hospitalized 04/2020 with bilateral upper and lower extremity proximal muscle weakness for 3 months, elevated CK myoglobinuria.  She was treated with IV fluids and started on prednisone 80 mg/day.  CK improved from [phone].  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  Anti-Jo antibody was negative Rituxan was started 01/2022   PMH -Covid infection January 2022  33-month follow-up visit.  She received her second dose of Rituxan last week Rheumatology consultation was reviewed from April, CK has increased to 488.  Prednisone has been decreased to 5 mg. She developed chest pain in January and underwent CT angiogram which showed unchanged ILD and thin cysts She developed facial eruption and saw dermatologist was told this was eczema. She reports increased cough especially on exertion and while lying on her left side.  She also reports hypersensitivity to cold air or water which causes a cough  She has gained 15 pounds.  Her daughter is now 50 year old  Significant tests/ events reviewed   06/2021 HRCT Peribronchovascular ground-glass opacities which are most prominent in the mid and lower lungs with associated small cysts   PFTs 06/2021 moderate to severe intraparenchymal restriction, ratio 75, FEV1 42%, 14% bronchodilator response, FVC 49%, TLC 49%, DLCO 10.99/46%   Review of Systems neg for any significant sore throat, dysphagia, itching, sneezing, nasal congestion or excess/ purulent secretions, fever, chills, sweats, unintended wt loss, pleuritic or exertional cp, hempoptysis, orthopnea pnd or change in  chronic leg swelling. Also denies presyncope, palpitations, heartburn, abdominal pain, nausea, vomiting, diarrhea or change in bowel or urinary habits, dysuria,hematuria, rash, arthralgias, visual complaints, headache, numbness weakness or ataxia.     Objective:   Physical Exam  Gen. Pleasant, obese, in no distress, normal affect ENT - no pallor,icterus, no post nasal drip, class 2 airway Neck: No JVD, no thyromegaly, no carotid bruits Lungs: no use of accessory muscles, no dullness to percussion, decreased without rales or rhonchi  Cardiovascular: Rhythm regular, heart sounds  normal, no murmurs or gallops, no peripheral edema Abdomen: soft and non-tender, no hepatosplenomegaly, BS normal. Musculoskeletal: No deformities, no cyanosis or clubbing Neuro:  alert, non focal, no tremors       Assessment & Plan:

## 2022-08-03 NOTE — Assessment & Plan Note (Signed)
Hopeful that she can lose weight now that prednisone being tapered

## 2022-08-16 ENCOUNTER — Ambulatory Visit: Payer: BC Managed Care – PPO

## 2022-08-26 ENCOUNTER — Ambulatory Visit (INDEPENDENT_AMBULATORY_CARE_PROVIDER_SITE_OTHER): Payer: BC Managed Care – PPO

## 2022-08-26 VITALS — BP 103/69 | HR 76 | Temp 98.1°F | Resp 18 | Ht 65.0 in | Wt 268.6 lb

## 2022-08-26 DIAGNOSIS — J849 Interstitial pulmonary disease, unspecified: Secondary | ICD-10-CM

## 2022-08-26 DIAGNOSIS — T451X5A Adverse effect of antineoplastic and immunosuppressive drugs, initial encounter: Secondary | ICD-10-CM

## 2022-08-26 DIAGNOSIS — Q998 Other specified chromosome abnormalities: Secondary | ICD-10-CM | POA: Diagnosis not present

## 2022-08-26 DIAGNOSIS — Z79899 Other long term (current) drug therapy: Secondary | ICD-10-CM | POA: Diagnosis not present

## 2022-08-26 DIAGNOSIS — Z111 Encounter for screening for respiratory tuberculosis: Secondary | ICD-10-CM

## 2022-08-26 DIAGNOSIS — M3320 Polymyositis, organ involvement unspecified: Secondary | ICD-10-CM

## 2022-08-26 MED ORDER — SODIUM CHLORIDE 0.9 % IV SOLN: Freq: Once | INTRAVENOUS | Status: AC | PRN

## 2022-08-26 MED ORDER — HEPARIN SOD (PORK) LOCK FLUSH 100 UNIT/ML IV SOLN: 500.0000 [IU] | Freq: Once | INTRAVENOUS | Status: DC | PRN

## 2022-08-26 MED ORDER — FAMOTIDINE IN NACL 20-0.9 MG/50ML-% IV SOLN: 20.0000 mg | Freq: Once | INTRAVENOUS | Status: DC | PRN

## 2022-08-26 MED ORDER — ACETAMINOPHEN 325 MG PO TABS
650.0000 mg | ORAL_TABLET | Freq: Once | ORAL | Status: AC
  Administered 2022-08-26: 650 mg via ORAL
  Filled 2022-08-26: qty 2

## 2022-08-26 MED ORDER — SODIUM CHLORIDE 0.9% FLUSH: 3.0000 mL | Freq: Once | INTRAVENOUS | Status: DC | PRN

## 2022-08-26 MED ORDER — SODIUM CHLORIDE 0.9 % IV SOLN
1000.0000 mg | Freq: Once | INTRAVENOUS | Status: AC
  Administered 2022-08-26: 1000 mg via INTRAVENOUS
  Filled 2022-08-26: qty 100

## 2022-08-26 MED ORDER — ALTEPLASE 2 MG IJ SOLR: 2.0000 mg | Freq: Once | INTRAMUSCULAR | Status: DC | PRN

## 2022-08-26 MED ORDER — ALBUTEROL SULFATE HFA 108 (90 BASE) MCG/ACT IN AERS: 2.0000 | INHALATION_SPRAY | Freq: Once | RESPIRATORY_TRACT | Status: DC | PRN

## 2022-08-26 MED ORDER — SODIUM CHLORIDE 0.9% FLUSH: 10.0000 mL | Freq: Once | INTRAVENOUS | Status: DC | PRN

## 2022-08-26 MED ORDER — DIPHENHYDRAMINE HCL 50 MG/ML IJ SOLN
50.0000 mg | Freq: Once | INTRAMUSCULAR | Status: AC | PRN
  Administered 2022-08-26: 50 mg via INTRAVENOUS

## 2022-08-26 MED ORDER — DIPHENHYDRAMINE HCL 25 MG PO CAPS
50.0000 mg | ORAL_CAPSULE | Freq: Once | ORAL | Status: AC
  Administered 2022-08-26: 50 mg via ORAL
  Filled 2022-08-26: qty 2

## 2022-08-26 MED ORDER — EPINEPHRINE 0.3 MG/0.3ML IJ SOAJ: 0.3000 mg | Freq: Once | INTRAMUSCULAR | Status: DC | PRN

## 2022-08-26 MED ORDER — METHYLPREDNISOLONE SODIUM SUCC 125 MG IJ SOLR
100.0000 mg | Freq: Once | INTRAMUSCULAR | Status: AC
  Administered 2022-08-26: 100 mg via INTRAVENOUS
  Filled 2022-08-26: qty 2

## 2022-08-26 MED ORDER — METHYLPREDNISOLONE SODIUM SUCC 125 MG IJ SOLR: 125.0000 mg | Freq: Once | INTRAMUSCULAR | Status: DC | PRN

## 2022-08-26 MED ORDER — ANTICOAGULANT SODIUM CITRATE 4% (200MG/5ML) IV SOLN: 5.0000 mL | Freq: Once | Status: DC | PRN

## 2022-08-26 MED ORDER — HEPARIN SOD (PORK) LOCK FLUSH 100 UNIT/ML IV SOLN: 250.0000 [IU] | Freq: Once | INTRAVENOUS | Status: DC | PRN

## 2022-08-26 NOTE — Progress Notes (Signed)
Diagnosis: ILD  Provider:  Chilton Greathouse MD  Procedure: IV Infusion  IV Type: Peripheral, IV Location: L Hand  Rituxan (Rituximab), Dose: 1000 ml  Infusion Start Time: 1610  Infusion Stop Time: 1124  At 11:24 when last titration was due to begin, pt started complaining of itchiness in throat and tongue feeling "heavy". Patient denies SOB, Rash, Tightness in chest. All vitals stable.  Infusion was stopped. Bolus of NS started, IVP Benadryl 50mg  administered, and Dr. Was called to evaluate. Symptoms resolved within 5 minutes of beandryl administration.  Dr. Maple Hudson evaluated pt, advised to monitor 1 hour, not to restart Rituxan today, and to notify prescribing Dr. I have sent message to Dr. Sheliah Hatch.   Post Infusion IV Care: Peripheral IV Discontinued  Discharge: Condition: Monitored 1 hour post reaction symptoms. Good, Destination: Home . AVS Declined  Performed by:  Loney Hering, LPN   Patient was monitored for 1 hour without any symptoms. Per Dr. [name], infusion was restarted at 1238 at one-half most recent rate, and slowly titrated to completion. Patient denied any additional symptoms. VSS.   Infusion was stopped at 1550. Patient stated she had a driver to take her home and verbalized understanding that she should seek care if symptoms returned.  Wyvonne Lenz, RN

## 2022-09-01 ENCOUNTER — Ambulatory Visit (INDEPENDENT_AMBULATORY_CARE_PROVIDER_SITE_OTHER): Payer: BC Managed Care – PPO | Admitting: Pulmonary Disease

## 2022-09-01 DIAGNOSIS — J849 Interstitial pulmonary disease, unspecified: Secondary | ICD-10-CM | POA: Diagnosis not present

## 2022-09-01 DIAGNOSIS — M3320 Polymyositis, organ involvement unspecified: Secondary | ICD-10-CM

## 2022-09-01 LAB — PULMONARY FUNCTION TEST
DL/VA % pred: 90 %
DL/VA: 4.24 ml/min/mmHg/L
DLCO cor % pred: 50 %
DLCO cor: 11.7 ml/min/mmHg
DLCO unc % pred: 48 %
DLCO unc: 11.17 ml/min/mmHg
FEF 25-75 Post: 0.98 L/sec
FEF 25-75 Pre: 1.59 L/sec
FEF2575-%Change-Post: -38 %
FEF2575-%Pred-Post: 26 %
FEF2575-%Pred-Pre: 42 %
FEV1-%Change-Post: -8 %
FEV1-%Pred-Post: 42 %
FEV1-%Pred-Pre: 45 %
FEV1-Post: 1.42 L
FEV1-Pre: 1.54 L
FEV1FVC-%Change-Post: -2 %
FEV1FVC-%Pred-Pre: 94 %
FEV6-%Change-Post: -6 %
FEV6-%Pred-Post: 45 %
FEV6-%Pred-Pre: 48 %
FEV6-Post: 1.78 L
FEV6-Pre: 1.91 L
FEV6FVC-%Change-Post: 0 %
FEV6FVC-%Pred-Post: 99 %
FEV6FVC-%Pred-Pre: 100 %
FVC-%Change-Post: -5 %
FVC-%Pred-Post: 46 %
FVC-%Pred-Pre: 48 %
FVC-Post: 1.8 L
FVC-Pre: 1.91 L
Post FEV1/FVC ratio: 79 %
Post FEV6/FVC ratio: 99 %
Pre FEV1/FVC ratio: 81 %
Pre FEV6/FVC Ratio: 100 %
RV % pred: 379 %
RV: 4.99 L
TLC % pred: 128 %
TLC: 6.67 L

## 2022-09-01 NOTE — Patient Instructions (Signed)
Full PFT performed today. °

## 2022-09-01 NOTE — Progress Notes (Signed)
Full PFT performed today. °

## 2022-09-06 ENCOUNTER — Encounter (HOSPITAL_BASED_OUTPATIENT_CLINIC_OR_DEPARTMENT_OTHER): Payer: BC Managed Care – PPO

## 2022-09-14 ENCOUNTER — Other Ambulatory Visit: Payer: Self-pay | Admitting: *Deleted

## 2022-09-14 DIAGNOSIS — M3320 Polymyositis, organ involvement unspecified: Secondary | ICD-10-CM

## 2022-09-14 MED ORDER — PREDNISONE 5 MG PO TABS
5.0000 mg | ORAL_TABLET | Freq: Every day | ORAL | 0 refills | Status: DC
Start: 2022-09-14 — End: 2023-02-16

## 2022-09-14 NOTE — Progress Notes (Signed)
Last Fill: 04/14/2022  Next Visit: 10/13/2022  Last Visit: 07/13/2022  Dx: Polymyositis   Current Dose per office note on 07/13/2022: prednisone 5 mg daily   Okay to refill Prednisone?

## 2022-10-12 NOTE — Progress Notes (Signed)
Office Visit Note  Patient: Rachael Patton             Date of Birth: 03/04/2001           MRN: [id number]             PCP: [name], PA Referring: [name], PA Visit Date: 10/13/2022   Subjective:  Follow-up (Patient states she fell about a month ago and states the muscles in the back of her left thigh hurts. )   History of Present Illness: Rachael Patton is a 22 y.o. 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  She had an infusion reaction during the last rituximab treatment part way through it developed some throat tingling and tightness sensation this resolved after administration of steroids antihistamine and pausing the infusion it was able to be resumed same day and completed uneventfully.  Symptoms overall have been stable. Ongoing skin rashes thought consistent with eczema and dryness with contribution of long term steroid use at dermatology assessment. She had PFTs last month showed small improvement compared to study last year. Has not been feeling pain or weakness except past month after she fell while cleaning the floor landing on the back of her left thigh with some pain in that area since.   Previous HPI 07/13/22 Rachael Patton is a 22 y.o. female here for follow up for myositis with associated ILD on azathioprine 150 mg daily prednisone 5 mg daily on rituximab infusion 1 g x 2 doses started in November.  Overall she feels well has not noticed any significant difference tapering the prednisone from 20 mg down to 5 mg daily.  No weakness.  No coughing or shortness of breath.  Skin rashes remain a problem particularly on the face and neck and she has appointment scheduled with dermatology coming up this week.   Previous HPI 04/13/22 Rachael Patton is a 22 y.o. female here for follow up polymyositis with ILD on azathioprine 150 mg daily prednisone 20 mg daily and  started rituximab first infusions 11/6 and 11/20. She had no complications with the treatment. No change of dyspnea or cough symptoms. She had a visit to the ED with some left sided chest pain and workup including CT at that time looked fine. She has noticed some more facial rash with skin peeling and dark areas under her eyes.     Previous HPI 01/08/22 Rachael Patton is a 22 y.o. female here for follow up for polymyositis with ILD on azathioprine 150 mg daily and prednisone 20 mg daily. Since increasing the steroid dose her facial rashes are improving. She is gaining some weight on this. No particular coughing or shortness of breath complaint currently. No weakness or trouble walking or stairs.   Previous HPI 11/06/2021  Rachael Patton is a 22 y.o. female here for follow up for polymyositis on azathioprine 150 mg daily and prednisone 5 mg daily.  We increased her azathioprine dose after last visit due to elevated CK levels she had some generalized fatigue but no focal changes in muscle function at that time.  Pulmonology clinic follow-up concerning for deficits.  She thinks her fatigue or energy may be a little bit better but there is some increase in hyperpigmented facial rashes and some swelling around both eyes is new.  No visual changes or pain just notices the swelling   Previous HPI 07/17/2021 Rachael Patton is  a 22 y.o. female here for follow up for polymyositis on azathioprine 100 mg daily and prednisone 5 mg daily. She has developed a persistent nonproductive cough again since our last visit. Not associated with shortness of breath or chest pain. She has eye dryness and itching. Facial hyperpigmented rashes are increased the areas on her arms are about the same. Rashes not particularly painful or itchy. She denies any weakness or mobility problem denies any swallowing problems.   Previous HPI 04/17/21 Rachael Patton is a 22 y.o. female here for follow up for polymyositis on azathioprine 100  mg PO daily and prednisone 5 mg daily. She deliver her daughter about a month ago without major complications. She slipped her left leg during laboring and felt pain in the anterior hip that has gradually improved over time.   Previous HPI: 05/08/20 Rachael Patton is a 22 y.o. female with history of hypothyroidism, elevated prolactin here for follow up of hospitalization last week with proximal muscle weakness found to have highly elevated CK and myoglobinuria. This was treated with IV fluids and started steroid treatment with improvement in CK from 11,796 to 4,047. She was discharged on prednisone 80mg  per day dose. Prior to this she was ill with COVID a month ago. She was having some type of GI illness in December not clear if this was related to COVID infection or separate process. Workup with labs on 1/6 and 1/7 showing transaminitis also positive Ab tests for COVID19 and Hepatitis A. Since the hospital visit she feels her symptoms are partially improved. She notices continued leg swelling and has some dyspnea with lying supine and on exertion. Skin rash on the face is slightly less warm and severe than before. She thinks strength is slightly better but not at her baseline at all.    Review of Systems  Constitutional:  Positive for fatigue.  HENT:  Negative for mouth sores and mouth dryness.   Eyes:  Positive for dryness.  Respiratory:  Negative for shortness of breath.   Cardiovascular:  Negative for chest pain and palpitations.  Gastrointestinal:  Negative for blood in stool, constipation and diarrhea.  Endocrine: Negative for increased urination.  Genitourinary:  Negative for involuntary urination.  Musculoskeletal:  Positive for joint pain and joint pain. Negative for gait problem, joint swelling, myalgias, muscle weakness, morning stiffness, muscle tenderness and myalgias.  Skin:  Positive for color change. Negative for rash, hair loss and sensitivity to sunlight.  Allergic/Immunologic:  Positive for susceptible to infections.  Neurological:  Negative for dizziness and headaches.  Hematological:  Negative for swollen glands.  Psychiatric/Behavioral:  Negative for depressed mood and sleep disturbance. The patient is not nervous/anxious.     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    Past Medical History:  Diagnosis Date   Anemia    Asthma    As a child   Myositis    Prediabetes    Rhabdomyolysis    Thyroid disease    found at age 46  Family History  Problem Relation Age of Onset   Multiple sclerosis Mother    Diabetes Mother    Diabetes Maternal Grandmother    Diabetes Paternal Grandmother    Stroke Paternal Grandmother    Stroke Paternal Grandfather    Diabetes Paternal Grandfather    Multiple sclerosis Cousin    Lupus Cousin    Past Surgical History:  Procedure Laterality Date   FOOT SURGERY Left 06/2019   MUSCLE BIOPSY Right 05/14/2020   Procedure: RIGHT THIGH MUSCLE BIOPSY;  Surgeon: Fritzi Mandes, MD;  Location: [facility] OR;  Service: General;  Laterality: Right;  ROOM 3 STARTING AT 04:00PM FOR   Social History   Social History Narrative   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; forensic pyschology   Lives with boyfriend   No children   Immunization History  Administered Date(s) Administered   DTaP 09/26/1998, 04/02/1999, 05/08/1999, 04/08/2000, 09/05/2002   HIB (PRP-OMP) 09/26/1998, 04/02/1999, 05/08/1999,  08/21/1999   HPV 9-valent 01/31/2020   HPV Quadrivalent 01/22/2011, 07/06/2016, 11/20/2019   Hepatitis A, Ped/Adol-2 Dose 11/20/2010, 07/06/2016   Hepatitis B, PED/ADOLESCENT 11/17/1998, 04/21/1999, 04/08/2000   IPV 11/22/1998, 04/02/1999, 04/08/2000, 09/05/2002   Influenza,inj,Quad PF,6+ Mos 01/20/2022   MMR 04/08/2000, 09/05/2002   MenQuadfi_Meningococcal Groups ACYW Conjugate 11/20/2010, 07/06/2016   PFIZER(Purple Top)SARS-COV-2 Vaccination 04/17/2020   Pneumococcal Conjugate PCV 7 04/02/1999, 05/08/1999, 08/21/1999   Tdap 11/14/2009, 03/17/2021     Objective: Vital Signs: BP 124/80 (BP Location: Left Arm, Patient Position: Sitting, Cuff Size: Normal)   Pulse (!) 106   Resp 14   Ht 5\' 5"  (1.651 m)   Wt 269 lb (122 kg)   Breastfeeding No   BMI 44.76 kg/m    Physical Exam Constitutional:      Appearance: She is obese.  Eyes:     Conjunctiva/sclera: Conjunctivae normal.  Cardiovascular:     Rate and Rhythm: Normal rate and regular rhythm.  Pulmonary:     Effort: Pulmonary effort is normal.     Breath sounds: Normal breath sounds.  Lymphadenopathy:     Cervical: No cervical adenopathy.  Skin:    General: Skin is warm and dry.     Findings: Rash present.     Comments: Rash on face, neck, left elbow flexural surface flat with mostly hyperpigmented changes except some central facial clearing  Neurological:     Mental Status: She is alert.  Psychiatric:        Mood and Affect: Mood normal.      Musculoskeletal Exam:  Shoulders full ROM no tenderness or swelling Elbows full ROM no tenderness or swelling Wrists full ROM no tenderness or swelling Fingers full ROM no tenderness or swelling No paraspinal tenderness to palpation over upper and lower back Hip normal internal and external rotation without pain Mild tenderness to deep pressure on posterior of left upper leg, no radiation Knees full ROM no tenderness or swelling  Investigation: No additional  findings.  Imaging: No results found.  Recent Labs: Lab Results  Component Value Date   WBC 11.8 (H) 10/13/2022   HGB 11.8 10/13/2022   PLT 360 10/13/2022   NA 138 10/13/2022   K 4.3 10/13/2022   CL 103 10/13/2022   CO2 27 10/13/2022   GLUCOSE 130 (H) 10/13/2022   BUN 11 10/13/2022   CREATININE 0.80 10/13/2022   BILITOT 0.5 10/13/2022   ALKPHOS 97 02/15/2022   AST 16 10/13/2022   ALT 10 10/13/2022   PROT 8.3 (H) 10/13/2022   ALBUMIN 3.4 (L)  02/15/2022   CALCIUM 9.4 10/13/2022   GFRAA 153 07/30/2020   QFTBGOLDPLUS INDETERMINATE (A) 01/08/2022    Speciality Comments: No specialty comments available.  Procedures:  No procedures performed Allergies: Patient has no known allergies.   Assessment / Plan:     Visit Diagnoses: Polymyositis (HCC) - Plan: CK Not particularly symptomatic from muscle involvement had mild worsening in CK elevation will recheck levels today.  Would like to see his reach normal range to taper off remaining low-dose prednisone which may be contributing to skin problems and difficulty losing weight.  If worse could consider alternative to azathioprine such as mycophenolate.  Current plan continue azathioprine 150 mg daily and rituximab 1000 g 2 doses every 6 months.  Due to infusion reaction would recommend maintaining lower infusion rate at subsequent doses.  ILD (interstitial lung disease) (HCC)  No new symptomatic complaints and most recent PFTs showed mild improvement compared to 1 year prior.  Pulmonology plan to update high-resolution chest CT scan later this year as well.  High risk medication use - Plan: COMPLETE METABOLIC PANEL WITH GFR, CBC with Differential/Platelet  Checking CBC and CMP for medication monitoring on long-term use of azathioprine prednisone and rituximab.  She has not had any serious interval infections.  She had grade 2 infusion reaction to rituximab with the last treatment dose but was able to complete this after a  pause.  Rash and other nonspecific skin eruption  Rashes remain about the since last visit had some concern about if this could represent dermatomyositis but with no photosensitivity no concerning inflammatory changes from dermatology evaluation.  Orders: Orders Placed This Encounter  Procedures   CK   COMPLETE METABOLIC PANEL WITH GFR   CBC with Differential/Platelet   No orders of the defined types were placed in this encounter.    Follow-Up Instructions: Return in about 3 months (around 01/13/2023) for PM on RTX/AZA/GVC f/u 3mos.   Fuller Plan, MD  Note - This record has been created using [employer].  Chart creation errors have been sought, but may not always  have been located. Such creation errors do not reflect on  the standard of medical care.

## 2022-10-13 ENCOUNTER — Encounter: Payer: Self-pay | Admitting: Internal Medicine

## 2022-10-13 ENCOUNTER — Ambulatory Visit: Payer: BC Managed Care – PPO | Attending: Internal Medicine | Admitting: Internal Medicine

## 2022-10-13 VITALS — BP 124/80 | HR 106 | Resp 14 | Ht 65.0 in | Wt 269.0 lb

## 2022-10-13 DIAGNOSIS — Z79899 Other long term (current) drug therapy: Secondary | ICD-10-CM

## 2022-10-13 DIAGNOSIS — M3320 Polymyositis, organ involvement unspecified: Secondary | ICD-10-CM

## 2022-10-13 DIAGNOSIS — R21 Rash and other nonspecific skin eruption: Secondary | ICD-10-CM

## 2022-10-13 DIAGNOSIS — J849 Interstitial pulmonary disease, unspecified: Secondary | ICD-10-CM

## 2022-10-14 LAB — COMPLETE METABOLIC PANEL WITH GFR
AG Ratio: 0.9 (calc) — ABNORMAL LOW (ref 1.0–2.5)
ALT: 10 U/L (ref 6–29)
AST: 16 U/L (ref 10–30)
Albumin: 4 g/dL (ref 3.6–5.1)
Alkaline phosphatase (APISO): 100 U/L (ref 31–125)
BUN: 11 mg/dL (ref 7–25)
CO2: 27 mmol/L (ref 20–32)
Calcium: 9.4 mg/dL (ref 8.6–10.2)
Chloride: 103 mmol/L (ref 98–110)
Creat: 0.8 mg/dL (ref 0.50–0.96)
Globulin: 4.3 g/dL (calc) — ABNORMAL HIGH (ref 1.9–3.7)
Glucose, Bld: 130 mg/dL — ABNORMAL HIGH (ref 65–99)
Potassium: 4.3 mmol/L (ref 3.5–5.3)
Sodium: 138 mmol/L (ref 135–146)
Total Bilirubin: 0.5 mg/dL (ref 0.2–1.2)
Total Protein: 8.3 g/dL — ABNORMAL HIGH (ref 6.1–8.1)
eGFR: 105 mL/min/{1.73_m2} (ref >=60)

## 2022-10-14 LAB — CBC WITH DIFFERENTIAL/PLATELET
Absolute Monocytes: 649 cells/uL (ref 200–950)
Basophils Absolute: 35 cells/uL (ref 0–200)
Basophils Relative: 0.3 %
Eosinophils Absolute: 118 cells/uL (ref 15–500)
Eosinophils Relative: 1 %
HCT: 36.3 % (ref 35.0–45.0)
Hemoglobin: 11.8 g/dL (ref 11.7–15.5)
Lymphs Abs: 448 cells/uL — ABNORMAL LOW (ref 850–3900)
MCH: 27.8 pg (ref 27.0–33.0)
MCHC: 32.5 g/dL (ref 32.0–36.0)
MCV: 85.4 fL (ref 80.0–100.0)
MPV: 11.4 fL (ref 7.5–12.5)
Monocytes Relative: 5.5 %
Neutro Abs: 10549 cells/uL — ABNORMAL HIGH (ref 1500–7800)
Neutrophils Relative %: 89.4 %
Platelets: 360 10*3/uL (ref 140–400)
RBC: 4.25 10*6/uL (ref 3.80–5.10)
RDW: 14.8 % (ref 11.0–15.0)
Total Lymphocyte: 3.8 %
WBC: 11.8 10*3/uL — ABNORMAL HIGH (ref 3.8–10.8)

## 2022-10-14 LAB — CK: Total CK: 262 U/L — ABNORMAL HIGH (ref 29–143)

## 2022-10-18 ENCOUNTER — Other Ambulatory Visit: Payer: Self-pay | Admitting: *Deleted

## 2022-10-18 DIAGNOSIS — M3320 Polymyositis, organ involvement unspecified: Secondary | ICD-10-CM

## 2022-10-18 MED ORDER — AZATHIOPRINE 50 MG PO TABS
150.0000 mg | ORAL_TABLET | Freq: Every day | ORAL | 0 refills | Status: DC
Start: 2022-10-18 — End: 2023-03-24

## 2022-10-18 NOTE — Telephone Encounter (Signed)
Patient contacted the office and requested a refill of Imuran to be sent to the [facility]  Last Fill: 04/23/2022  Labs: 10/13/2022 Glucose 130, Total Protein 8.3, Globulin 4.3, AG Ratio 0.9, WBC 11.8, Neutro Abs 10,549 Lymphs Abs 448  Next Visit: 01/12/2023  Last Visit: 10/13/2022  DX: Polymyositis   Current Dose per office note 10/13/2022: azathioprine 150 mg daily   Okay to refill Imuran?

## 2022-11-25 IMAGING — CT CT CHEST HIGH RESOLUTION
1 of 7 series · 15 of 35 positions shown, 19 images · non-contrast
Comparison: None.

CLINICAL DATA: Interstitial lung disease



[Series 10: super d · axial · 0.71mm/px · z∈[-392,-157]mm · 15 of 446 slices shown, 19 images]
[im 27/446  mediastinal]
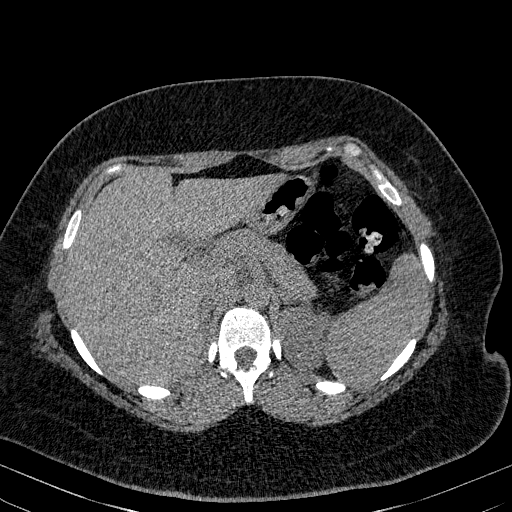
[im 27/446  lung]
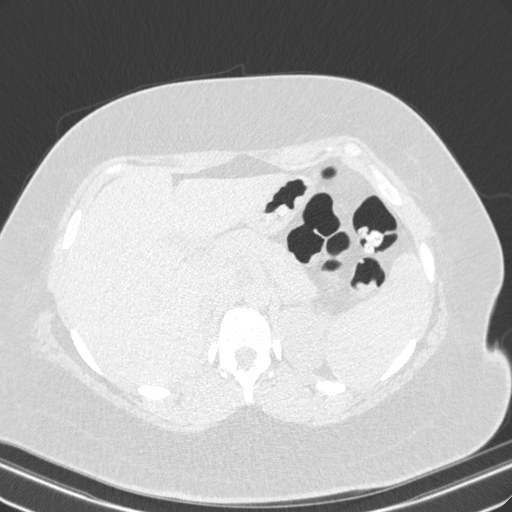
[im 53/446  lung]
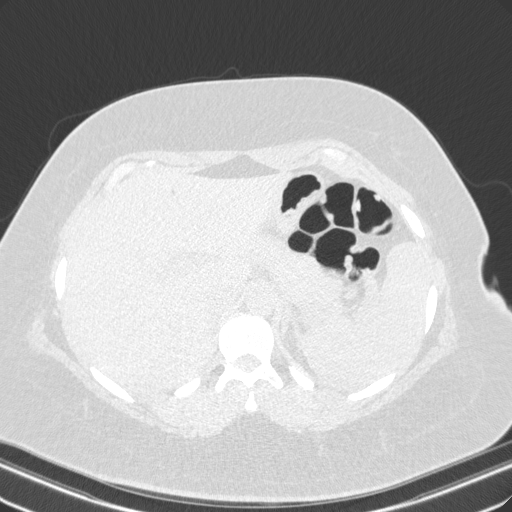
[im 79/446  lung]
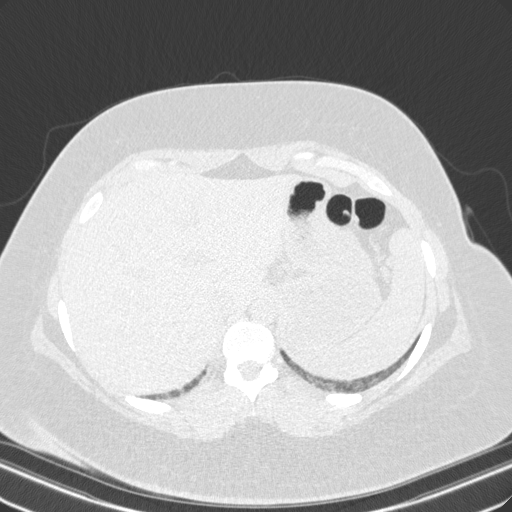
[im 105/446  lung]
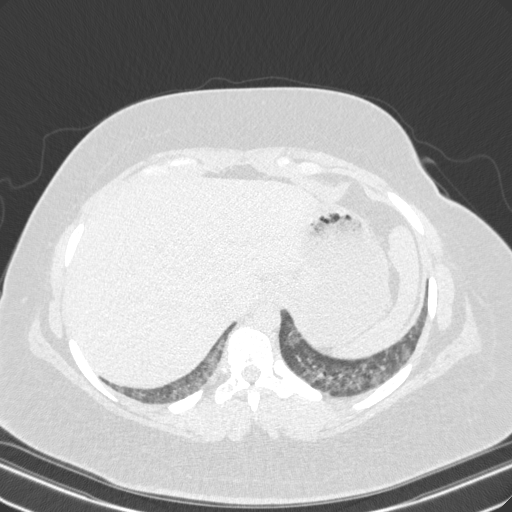
[im 158/446  mediastinal]
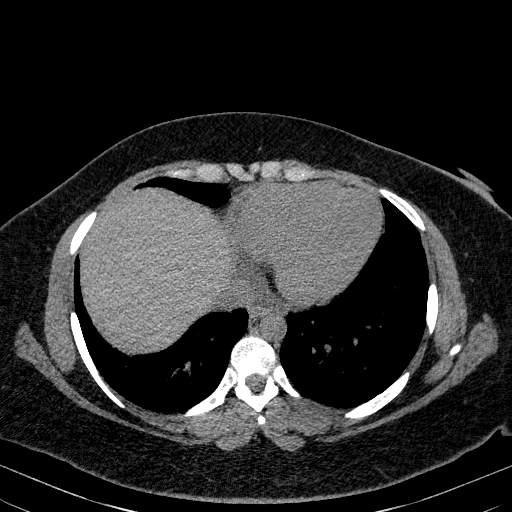
[im 158/446  lung]
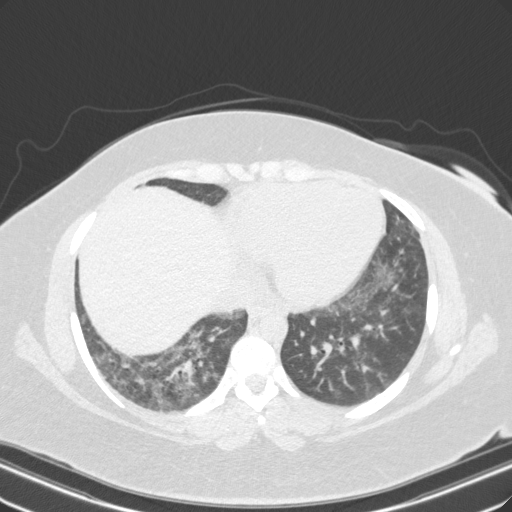
[im 184/446  lung]
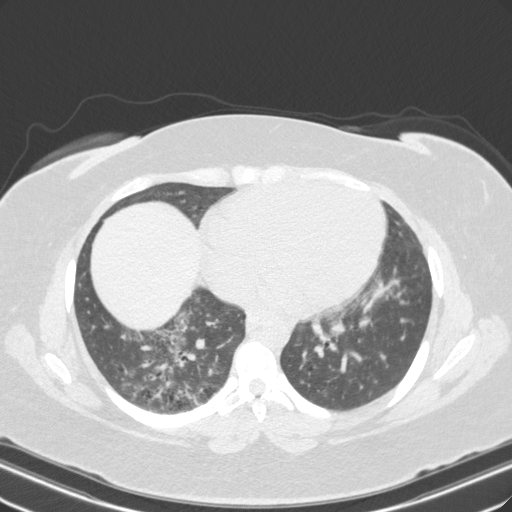
[im 207/446  lung]
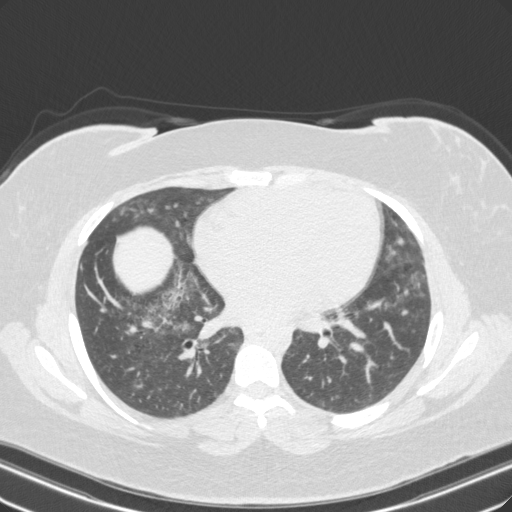
[im 210/446  lung]
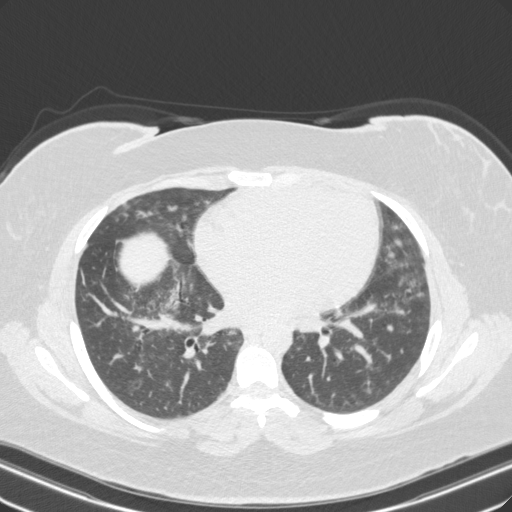
[im 236/446  mediastinal]
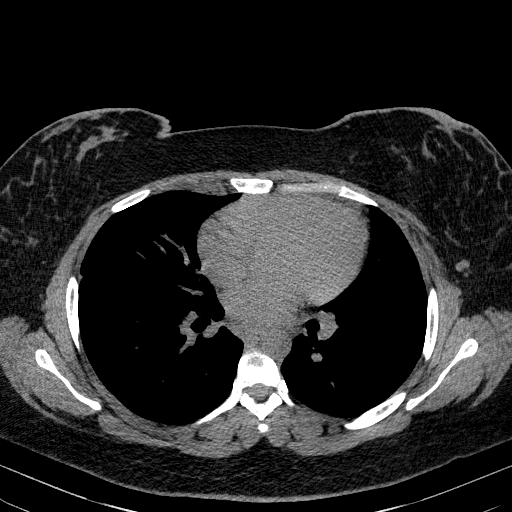
[im 236/446  lung]
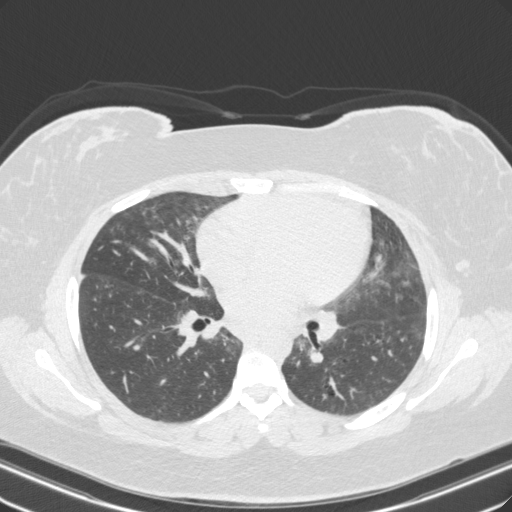
[im 262/446  lung]
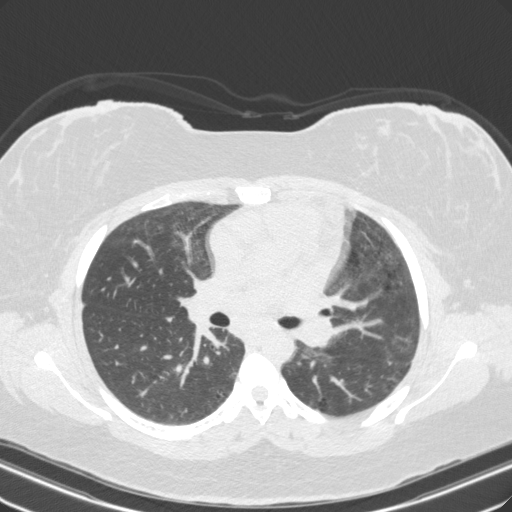
[im 288/446  lung]
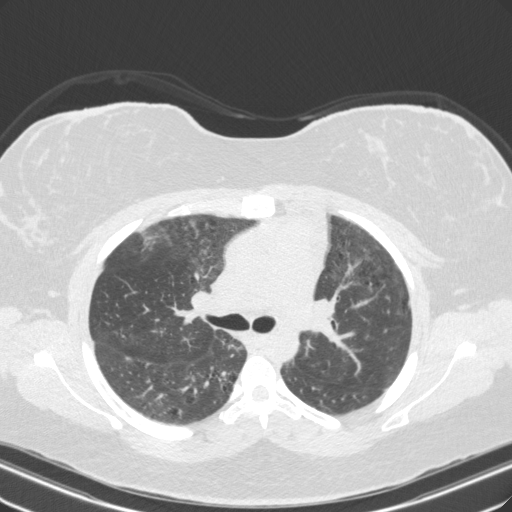
[im 341/446  lung]
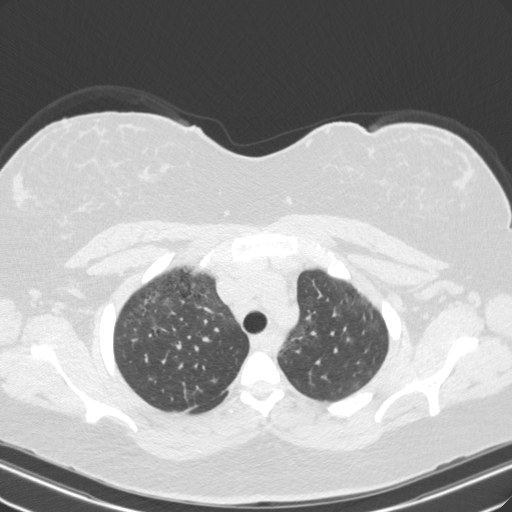
[im 367/446  mediastinal]
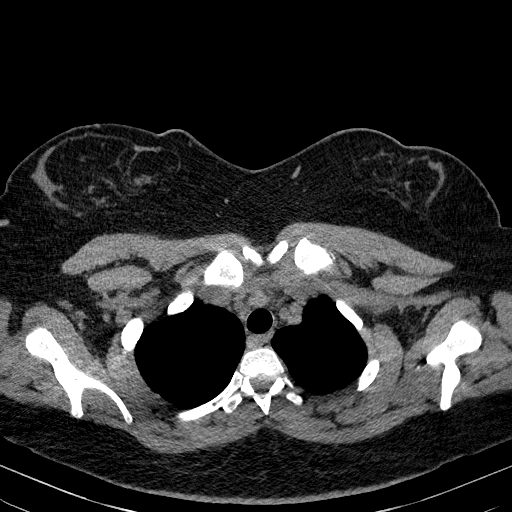
[im 367/446  lung]
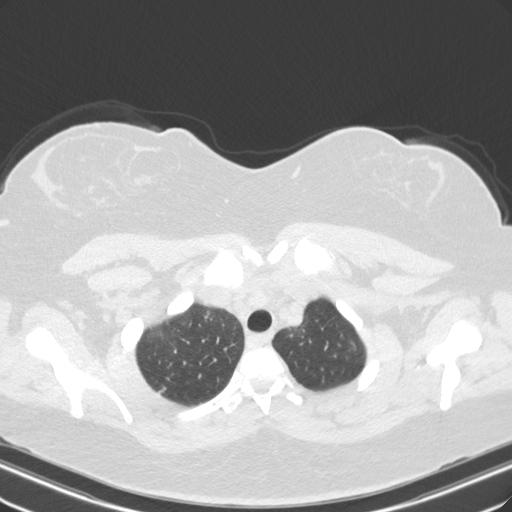
[im 393/446  lung]
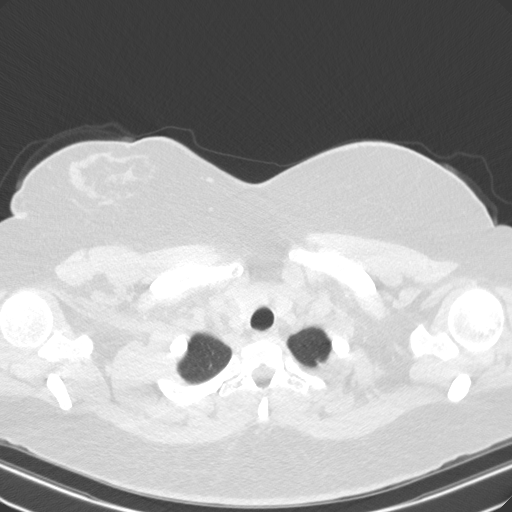
[im 419/446  lung]
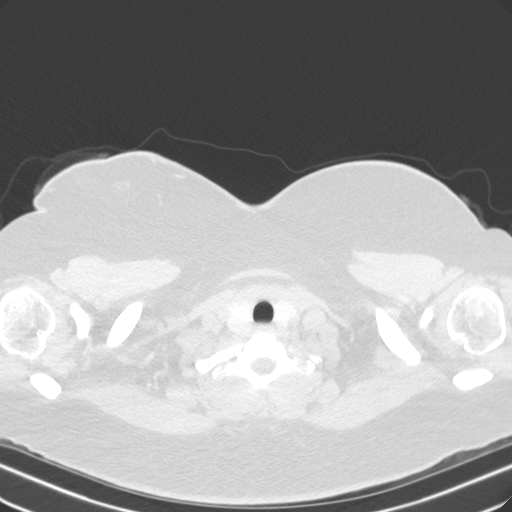

[15 of 35 positions shown; findings below may reference images not displayed]

FINDINGS: Cardiovascular: Normal heart size. No pericardial effusion. No
significant vascular findings.

Mediastinum/Nodes: Thyroid and esophagus are unremarkable. Mildly
enlarged left axillary nodes, likely reactive. Reference right
axillary lymph node measuring 1.4 cm in short axis on series 2,
image 40.

Lungs/Pleura: Central airways are patent. No air trapping.
Peribronchovascular ground-glass opacities which are most prominent
in the mid and lower lungs with associated small thin-walled cysts.
Mild traction bronchiolectasis with no evidence honeycomb change.

Upper Abdomen: No acute abnormality.

Musculoskeletal: No chest wall mass or suspicious bone lesions
identified.
IMPRESSION: Peribronchovascular ground-glass opacities which are most prominent
in the mid and lower lungs with associated small cysts. Favor
lymphocytic interstitial pneumonia, NSIP is an additional
consideration. Findings are suggestive of an alternative diagnosis
(not UIP) per consensus guidelines: Diagnosis of Idiopathic
Pulmonary Fibrosis: An Official ATS/ERS/JRS/ALAT Clinical Practice
Guideline. Am J Respir Crit Care Med Vol 198, Slah 5, ppe99-e[DATE]

## 2022-12-29 NOTE — Progress Notes (Deleted)
Office Visit Note  Patient: Rachael Patton             Date of Birth: September 02, 2000           MRN: [id number]             PCP: [name], PA Referring: [name], PA Visit Date: 01/12/2023   Subjective:  No chief complaint on file.   History of Present Illness: Rachael Patton is a 22 y.o. 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    Previous HPI 10/13/2022 Rachael Patton is a 22 y.o. 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  She had an infusion reaction during the last rituximab treatment part way through it developed some throat tingling and tightness sensation this resolved after administration of steroids antihistamine and pausing the infusion it was able to be resumed same day and completed uneventfully.  Symptoms overall have been stable. Ongoing skin rashes thought consistent with eczema and dryness with contribution of long term steroid use at dermatology assessment. She had PFTs last month showed small improvement compared to study last year. Has not been feeling pain or weakness except past month after she fell while cleaning the floor landing on the back of her left thigh with some pain in that area since.     Previous HPI 07/13/22 Rachael Patton is a 22 y.o. female here for follow up for myositis with associated ILD on azathioprine 150 mg daily prednisone 5 mg daily on rituximab infusion 1 g x 2 doses started in November.  Overall she feels well has not noticed any significant difference tapering the prednisone from 20 mg down to 5 mg daily.  No weakness.  No coughing or shortness of breath.  Skin rashes remain a problem particularly on the face and neck and she has appointment scheduled with dermatology coming up this week.   Previous HPI 04/13/22 Rachael Patton is a 21 y.o. 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 She had no complications with the treatment. No change of dyspnea or cough symptoms. She had a visit to the ED with some left sided chest pain and workup including CT at that time looked fine. She has noticed some more facial rash with skin peeling and dark areas under her eyes.     Previous HPI 01/08/22 Rachael Patton is a 22 y.o. female here for follow up for polymyositis with ILD on azathioprine 150 mg daily and prednisone 20 mg daily. Since increasing the steroid dose her facial rashes are improving. She is gaining some weight on this. No particular coughing or shortness of breath complaint currently. No weakness or trouble walking or stairs.   Previous HPI 11/06/2021  Rachael Patton is a 22 y.o. female here for follow up for polymyositis on azathioprine 150 mg daily and prednisone 5 mg daily.  We increased her azathioprine dose after last visit due to elevated CK levels she had some generalized fatigue but no focal changes in muscle function at that time.  Pulmonology clinic follow-up concerning for deficits.  She thinks her fatigue or energy may be a little bit better but there is some increase in hyperpigmented facial rashes and some swelling  around both eyes is new.  No visual changes or pain just notices the swelling   Previous HPI 07/17/2021 Rachael Patton is a 22 y.o. female here for follow up for polymyositis on azathioprine 100 mg daily and prednisone 5 mg daily. She has developed a persistent nonproductive cough again since our last visit. Not associated with shortness of breath or chest pain. She has eye dryness and itching. Facial hyperpigmented rashes are increased the areas on her arms are about the same. Rashes not particularly painful or itchy. She denies any weakness or mobility problem denies any swallowing  problems.   Previous HPI 04/17/21 Rachael Patton is a 22 y.o. female here for follow up for polymyositis on azathioprine 100 mg PO daily and prednisone 5 mg daily. She deliver her daughter about a month ago without major complications. She slipped her left leg during laboring and felt pain in the anterior hip that has gradually improved over time.   Previous HPI: 05/08/20 Rachael Patton is a 22 y.o. female with history of hypothyroidism, elevated prolactin here for follow up of hospitalization last week with proximal muscle weakness found to have highly elevated CK and myoglobinuria. This was treated with IV fluids and started steroid treatment with improvement in CK from 11,796 to 4,047. She was discharged on prednisone 80mg  per day dose. Prior to this she was ill with COVID a month ago. She was having some type of GI illness in December not clear if this was related to COVID infection or separate process. Workup with labs on 1/6 and 1/7 showing transaminitis also positive Ab tests for COVID19 and Hepatitis A. Since the hospital visit she feels her symptoms are partially improved. She notices continued leg swelling and has some dyspnea with lying supine and on exertion. Skin rash on the face is slightly less warm and severe than before. She thinks strength is slightly better but not at her baseline at all.   No Rheumatology ROS completed.   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    Past Medical History:  Diagnosis Date   Anemia    Asthma    As a child   Myositis    Prediabetes    Rhabdomyolysis    Thyroid disease    found at age 21    Family History  Problem Relation Age of Onset   Multiple sclerosis Mother    Diabetes Mother    Diabetes Maternal Grandmother    Diabetes Paternal Grandmother    Stroke Paternal Grandmother    Stroke Paternal Grandfather    Diabetes Paternal Grandfather    Multiple sclerosis Cousin    Lupus Cousin    Past Surgical History:  Procedure Laterality Date   FOOT SURGERY Left 06/2019   MUSCLE BIOPSY Right 05/14/2020   Procedure: RIGHT THIGH MUSCLE BIOPSY;  Surgeon: Fritzi Mandes, MD;  Location: [facility] OR;  Service: General;  Laterality: Right;  ROOM 3 STARTING AT 04:00PM FOR   Social History   Social History  Narrative   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; forensic pyschology   Lives with boyfriend   No children   Immunization History  Administered Date(s) Administered   DTaP 09/26/1998, 04/02/1999, 05/08/1999, 04/08/2000, 09/05/2002   HIB (PRP-OMP) 09/26/1998, 04/02/1999, 05/08/1999, 08/21/1999   HPV 9-valent 01/31/2020   HPV Quadrivalent 01/22/2011, 07/06/2016, 11/20/2019   Hepatitis A, Ped/Adol-2 Dose 11/20/2010, 07/06/2016   Hepatitis B, PED/ADOLESCENT 11/17/1998, 04/21/1999, 04/08/2000   IPV 11/22/1998, 04/02/1999, 04/08/2000, 09/05/2002   Influenza,inj,Quad PF,6+ Mos 01/20/2022   MMR 04/08/2000, 09/05/2002   MenQuadfi_Meningococcal Groups ACYW Conjugate 11/20/2010, 07/06/2016   PFIZER(Purple Top)SARS-COV-2 Vaccination 04/17/2020   Pneumococcal Conjugate PCV 7 04/02/1999, 05/08/1999, 08/21/1999   Tdap 11/14/2009, 03/17/2021     Objective: Vital Signs: There were no vitals taken for this visit.   Physical Exam   Musculoskeletal Exam: ***  CDAI Exam: CDAI Score: -- Patient Global: --; Provider Global: -- Swollen: --; Tender: -- Joint Exam 01/12/2023   No joint exam has been  documented for this visit   There is currently no information documented on the homunculus. Go to the Rheumatology activity and complete the homunculus joint exam.  Investigation: No additional findings.  Imaging: No results found.  Recent Labs: Lab Results  Component Value Date   WBC 11.8 (H) 10/13/2022   HGB 11.8 10/13/2022   PLT 360 10/13/2022   NA 138 10/13/2022   K 4.3 10/13/2022   CL 103 10/13/2022   CO2 27 10/13/2022   GLUCOSE 130 (H) 10/13/2022   BUN 11 10/13/2022   CREATININE 0.80 10/13/2022   BILITOT 0.5 10/13/2022   ALKPHOS 97 02/15/2022   AST 16 10/13/2022   ALT 10 10/13/2022   PROT 8.3 (H) 10/13/2022   ALBUMIN 3.4 (L) 02/15/2022   CALCIUM 9.4 10/13/2022   GFRAA 153 07/30/2020   QFTBGOLDPLUS INDETERMINATE (A) 01/08/2022    Speciality Comments: No specialty comments available.  Procedures:  No procedures performed Allergies: Patient has no known allergies.   Assessment / Plan:     Visit Diagnoses: No diagnosis found.  ***  Orders: No orders of the defined types were placed in this encounter.  No orders of the defined types were placed in this encounter.    Follow-Up Instructions: No follow-ups on file.   Metta Clines, RT  Note - This record has been created using [employer].  Chart creation errors have been sought, but may not always  have been located. Such creation errors do not reflect on  the standard of medical care.

## 2022-12-30 ENCOUNTER — Ambulatory Visit (HOSPITAL_BASED_OUTPATIENT_CLINIC_OR_DEPARTMENT_OTHER): Payer: BC Managed Care – PPO

## 2023-01-11 ENCOUNTER — Ambulatory Visit (HOSPITAL_BASED_OUTPATIENT_CLINIC_OR_DEPARTMENT_OTHER)
Admission: RE | Admit: 2023-01-11 | Discharge: 2023-01-11 | Disposition: A | Discharge status: Home / Self Care | Payer: BC Managed Care – PPO | Source: Ambulatory Visit | Attending: Pulmonary Disease | Admitting: Pulmonary Disease

## 2023-01-11 DIAGNOSIS — J849 Interstitial pulmonary disease, unspecified: Secondary | ICD-10-CM | POA: Insufficient documentation

## 2023-01-11 DIAGNOSIS — M3320 Polymyositis, organ involvement unspecified: Secondary | ICD-10-CM | POA: Insufficient documentation

## 2023-01-12 ENCOUNTER — Encounter: Payer: Self-pay | Admitting: Internal Medicine

## 2023-01-12 ENCOUNTER — Ambulatory Visit: Payer: BC Managed Care – PPO | Admitting: Internal Medicine

## 2023-01-12 DIAGNOSIS — R21 Rash and other nonspecific skin eruption: Secondary | ICD-10-CM

## 2023-01-12 DIAGNOSIS — Z7952 Long term (current) use of systemic steroids: Secondary | ICD-10-CM

## 2023-01-12 DIAGNOSIS — M3320 Polymyositis, organ involvement unspecified: Secondary | ICD-10-CM

## 2023-01-12 DIAGNOSIS — Z79899 Other long term (current) drug therapy: Secondary | ICD-10-CM

## 2023-01-12 DIAGNOSIS — J849 Interstitial pulmonary disease, unspecified: Secondary | ICD-10-CM

## 2023-01-27 NOTE — Progress Notes (Unsigned)
Office Visit Note  Patient: Rachael Patton             Date of Birth: 2001-01-25           MRN: [id number]             PCP: [name], PA Referring: [name], PA Visit Date: 02/09/2023   Subjective:  Follow-up   History of Present Illness: Rachael Patton is a 22 y.o. 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    Skin rash on face still active Slightly better with topical medication Cough with exertion Back hurts with prolonged stnading and wlaking  Hyperpigmented patches face, back of neck Lower lung fields ***  Previous HPI 10/13/2022 Rachael Patton is a 22 y.o. 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  She had an infusion reaction during the last rituximab treatment part way through it developed some throat tingling and tightness sensation this resolved after administration of steroids antihistamine and pausing the infusion it was able to be resumed same day and completed uneventfully.  Symptoms overall have been stable. Ongoing skin rashes thought consistent with eczema and dryness with contribution of long term steroid use at dermatology assessment. She had PFTs last month showed small improvement compared to study last year. Has not been feeling pain or weakness except past month after she fell while cleaning the floor landing on the back of her left thigh with some pain in that area since.     Previous HPI 07/13/22 Rachael Patton is a 22 y.o. female here for follow up for myositis with associated ILD on azathioprine 150 mg daily prednisone 5 mg daily on rituximab infusion 1 g x 2 doses started in November.  Overall she feels well has not noticed any significant difference tapering the prednisone from 20 mg down to 5 mg daily.  No weakness.  No coughing or  shortness of breath.  Skin rashes remain a problem particularly on the face and neck and she has appointment scheduled with dermatology coming up this week.   Previous HPI 04/13/22 Rachael Patton is a 22 y.o. 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 She had no complications with the treatment. No change of dyspnea or cough symptoms. She had a visit to the ED with some left sided chest pain and workup including CT at that time looked fine. She has noticed some more facial rash with skin peeling and dark areas under her eyes.     Previous HPI 01/08/22 Rachael Patton is a 22 y.o. female here for follow up for polymyositis with ILD on azathioprine 150 mg daily and prednisone 20 mg daily. Since increasing the steroid dose her facial rashes are improving. She is gaining some weight on this. No particular coughing or shortness of breath complaint currently. No weakness or trouble walking or stairs.   Previous HPI 11/06/2021  Rachael Patton is a 22 y.o. female here for follow up for polymyositis on azathioprine 150 mg daily and prednisone 5 mg daily.  We increased her azathioprine dose after last visit due to elevated CK levels she had some generalized fatigue but no focal changes in muscle function at that time.  Pulmonology clinic  follow-up concerning for deficits.  She thinks her fatigue or energy may be a little bit better but there is some increase in hyperpigmented facial rashes and some swelling around both eyes is new.  No visual changes or pain just notices the swelling   Previous HPI 07/17/2021 Rachael Patton is a 22 y.o. female here for follow up for polymyositis on azathioprine 100 mg daily and prednisone 5 mg daily. She has developed a persistent nonproductive cough again since our last visit. Not associated with shortness of breath or chest pain. She has eye dryness and itching. Facial  hyperpigmented rashes are increased the areas on her arms are about the same. Rashes not particularly painful or itchy. She denies any weakness or mobility problem denies any swallowing problems.   Previous HPI 04/17/21 Rachael Patton is a 22 y.o. female here for follow up for polymyositis on azathioprine 100 mg PO daily and prednisone 5 mg daily. She deliver her daughter about a month ago without major complications. She slipped her left leg during laboring and felt pain in the anterior hip that has gradually improved over time.   Previous HPI: 05/08/20 Rachael Patton is a 22 y.o. female with history of hypothyroidism, elevated prolactin here for follow up of hospitalization last week with proximal muscle weakness found to have highly elevated CK and myoglobinuria. This was treated with IV fluids and started steroid treatment with improvement in CK from 11,796 to 4,047. She was discharged on prednisone 80mg  per day dose. Prior to this she was ill with COVID a month ago. She was having some type of GI illness in December not clear if this was related to COVID infection or separate process. Workup with labs on 1/6 and 1/7 showing transaminitis also positive Ab tests for COVID19 and Hepatitis A. Since the hospital visit she feels her symptoms are partially improved. She notices continued leg swelling and has some dyspnea with lying supine and on exertion. Skin rash on the face is slightly less warm and severe than before. She thinks strength is slightly better but not at her baseline at all.   Review of Systems  Constitutional:  Positive for fatigue.  HENT:  Negative for mouth sores and mouth dryness.   Eyes:  Positive for dryness.  Respiratory:  Negative for shortness of breath.   Cardiovascular:  Negative for chest pain and palpitations.  Gastrointestinal:  Negative for blood in stool, constipation and diarrhea.  Endocrine: Negative for increased urination.  Genitourinary:  Negative for  involuntary urination.  Musculoskeletal:  Positive for joint pain, joint pain, joint swelling, myalgias and myalgias. Negative for gait problem, muscle weakness, morning stiffness and muscle tenderness.  Skin:  Positive for color change. Negative for rash, hair loss and sensitivity to sunlight.  Allergic/Immunologic: Positive for susceptible to infections.  Neurological:  Negative for dizziness and headaches.  Hematological:  Negative for swollen glands.  Psychiatric/Behavioral:  Negative for depressed mood and sleep disturbance. The patient is not nervous/anxious.     PMFS History:  Patient Active Problem List   Diagnosis Date Noted   Rash and other nonspecific skin eruption 10/13/2022   Screening for tuberculosis 11/20/2021   SS-A antibody positive 11/26/2020   Polymyositis (HCC) 07/18/2020   ILD (interstitial lung disease) (HCC) 06/25/2020   Myositis associated antibody positive 06/23/2020   High risk medication use 05/08/2020   Morbid obesity (HCC) 04/30/2020   Prolactin increased 04/30/2020   Hypothyroidism 04/30/2020   Prediabetes 04/30/2020   Iron deficiency anemia 04/30/2020   Tarsal  coalition of left foot 05/11/2019    Past Medical History:  Diagnosis Date   Anemia    Asthma    As a child   Myositis    Prediabetes    Rhabdomyolysis    Thyroid disease    found at age 86    Family History  Problem Relation Age of Onset   Multiple sclerosis Mother    Diabetes Mother    Diabetes Maternal Grandmother    Diabetes Paternal Grandmother    Stroke Paternal Grandmother    Stroke Paternal Grandfather    Diabetes Paternal Grandfather    Multiple sclerosis Cousin    Lupus Cousin    Past Surgical History:  Procedure Laterality Date   FOOT SURGERY Left 06/2019   MUSCLE BIOPSY Right 05/14/2020   Procedure: RIGHT THIGH MUSCLE BIOPSY;  Surgeon: Fritzi Mandes, MD;  Location: [facility] OR;  Service: General;  Laterality: Right;  ROOM 3 STARTING AT 04:00PM FOR   Social  History   Social History Narrative   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; forensic pyschology   Lives with boyfriend   No children   Immunization History  Administered Date(s) Administered   DTaP 09/26/1998, 04/02/1999, 05/08/1999, 04/08/2000, 09/05/2002   HIB (PRP-OMP) 09/26/1998, 04/02/1999, 05/08/1999, 08/21/1999   HPV 9-valent 01/31/2020   HPV Quadrivalent 01/22/2011, 07/06/2016, 11/20/2019   Hepatitis A, Ped/Adol-2 Dose 11/20/2010, 07/06/2016   Hepatitis B, PED/ADOLESCENT 11/17/1998, 04/21/1999, 04/08/2000   IPV 11/22/1998, 04/02/1999, 04/08/2000, 09/05/2002   Influenza,inj,Quad PF,6+ Mos 01/20/2022   MMR 04/08/2000, 09/05/2002   MenQuadfi_Meningococcal Groups ACYW Conjugate 11/20/2010, 07/06/2016   PFIZER(Purple Top)SARS-COV-2 Vaccination 04/17/2020   Pneumococcal Conjugate PCV 7 04/02/1999, 05/08/1999, 08/21/1999   Tdap 11/14/2009, 03/17/2021     Objective: Vital Signs: BP 134/84 (BP Location: Left Arm, Patient Position: Sitting, Cuff Size: Normal)   Pulse 80   Resp 14   Ht 5\' 5"  (1.651 m)   Wt 268 lb (121.6 kg)   Breastfeeding No   BMI 44.60 kg/m    Physical Exam   Musculoskeletal Exam: ***  CDAI Exam: CDAI Score: -- Patient Global: --; Provider Global: -- Swollen: --; Tender: -- Joint Exam 02/09/2023   No joint exam has been documented for this visit   There is currently no information documented on the homunculus. Go to the Rheumatology activity and complete the homunculus joint exam.  Investigation: No additional findings.  Imaging: CT CHEST HIGH RESOLUTION  Result Date: 02/07/2023 CLINICAL DATA:  22 year old female with history of asthma and myositis. Evaluate for interstitial lung disease. EXAM: CT CHEST WITHOUT CONTRAST TECHNIQUE: Multidetector CT imaging of the chest was performed following the standard protocol without intravenous contrast. High resolution imaging of the lungs, as well as inspiratory and expiratory imaging, was performed.  RADIATION DOSE REDUCTION: This exam was performed according to the departmental dose-optimization program which includes automated exposure control, adjustment of the mA and/or kV according to patient size and/or use of iterative reconstruction technique. COMPARISON:  Chest CTA 04/01/2022. FINDINGS: Cardiovascular: Heart size is normal. There is no significant pericardial fluid, thickening or pericardial calcification. Aberrant right subclavian artery (normal anatomical variant) incidentally noted. No atherosclerotic calcifications are noted in the thoracic aorta or the coronary arteries. Mediastinum/Nodes: No pathologically enlarged mediastinal or hilar lymph nodes. Please note that accurate exclusion of hilar adenopathy is limited on noncontrast CT scans. Esophagus is unremarkable in appearance. No axillary lymphadenopathy. Lungs/Pleura: Widespread regions of ground-glass attenuation, septal thickening, thickening of the peribronchovascular interstitium, mild cylindrical bronchiectasis and peripheral bronchiolectasis, and regional  areas of architectural distortion are scattered throughout the lungs bilaterally. There is a mild craniocaudal gradient. There may be some developing honeycombing, however, this is most evident in the anterior aspects of the upper lobes of the lungs bilaterally. Additionally, some regions of the lung bases appear relatively spared in the periphery (a finding that would be unusual for UIP). Inspiratory and expiratory imaging is unremarkable. No acute consolidative airspace disease. No pleural effusions. No definite suspicious appearing pulmonary nodules or masses are noted. Upper Abdomen: Unremarkable. Musculoskeletal: There are no aggressive appearing lytic or blastic lesions noted in the visualized portions of the skeleton. IMPRESSION: 1. 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 Repeat high-resolution chest CT should be considered in 12 months to assess for temporal changes in the appearance of the lung parenchyma. Electronically Signed   By: Trudie Reed M.D.   On: 02/07/2023 04:11    Recent Labs: Lab Results  Component Value Date   WBC 11.8 (H) 10/13/2022   HGB 11.8 10/13/2022   PLT 360 10/13/2022   NA 138 10/13/2022   K 4.3 10/13/2022   CL 103 10/13/2022   CO2 27 10/13/2022   GLUCOSE 130 (H) 10/13/2022   BUN 11 10/13/2022   CREATININE 0.80 10/13/2022   BILITOT 0.5 10/13/2022   ALKPHOS 97 02/15/2022   AST 16 10/13/2022   ALT 10 10/13/2022   PROT 8.3 (H) 10/13/2022   ALBUMIN 3.4 (L) 02/15/2022   CALCIUM 9.4 10/13/2022   GFRAA 153 07/30/2020   QFTBGOLDPLUS INDETERMINATE (A) 01/08/2022    Speciality Comments: No specialty comments available.  Procedures:  No procedures performed Allergies: Patient has no known allergies.   Assessment / Plan:     Visit Diagnoses: Polymyositis (HCC)  High risk medication use - azathioprine 150 mg daily and rituximab 1000 g 2 doses every 6 months.  ILD (interstitial lung disease) (HCC)  Rash and other nonspecific skin eruption  ***  Orders: No orders of the defined types were placed in this encounter.  No orders of the defined types were placed in this encounter.    Follow-Up Instructions: No follow-ups on file.   Fuller Plan, MD  Note - This record has been created using [employer].  Chart creation errors have been sought, but may not always  have been located. Such creation errors do not reflect on  the standard of medical care.

## 2023-02-09 ENCOUNTER — Ambulatory Visit: Payer: BC Managed Care – PPO | Attending: Internal Medicine | Admitting: Internal Medicine

## 2023-02-09 ENCOUNTER — Encounter: Payer: Self-pay | Admitting: Internal Medicine

## 2023-02-09 VITALS — BP 134/84 | HR 80 | Resp 14 | Ht 65.0 in | Wt 268.0 lb

## 2023-02-09 DIAGNOSIS — E559 Vitamin D deficiency, unspecified: Secondary | ICD-10-CM

## 2023-02-09 DIAGNOSIS — R21 Rash and other nonspecific skin eruption: Secondary | ICD-10-CM

## 2023-02-09 DIAGNOSIS — M3320 Polymyositis, organ involvement unspecified: Secondary | ICD-10-CM

## 2023-02-09 DIAGNOSIS — J849 Interstitial pulmonary disease, unspecified: Secondary | ICD-10-CM | POA: Diagnosis not present

## 2023-02-09 DIAGNOSIS — Z79899 Other long term (current) drug therapy: Secondary | ICD-10-CM

## 2023-02-09 NOTE — Patient Instructions (Signed)
I am checking markers for inflammation activity to see if we need to adjust medication.  I am checking vitamin D level.  For skin and hair problems can try adding biotin supplement. This is available over the counter at [employer] or supermarkets.

## 2023-02-10 ENCOUNTER — Telehealth: Payer: Self-pay | Admitting: Pharmacy Technician

## 2023-02-10 ENCOUNTER — Other Ambulatory Visit: Payer: Self-pay | Admitting: Pharmacist

## 2023-02-10 LAB — COMPLETE METABOLIC PANEL WITH GFR
AG Ratio: 0.8 (calc) — ABNORMAL LOW (ref 1.0–2.5)
ALT: 24 U/L (ref 6–29)
AST: 38 U/L — ABNORMAL HIGH (ref 10–30)
Albumin: 3.9 g/dL (ref 3.6–5.1)
Alkaline phosphatase (APISO): 97 U/L (ref 31–125)
BUN: 11 mg/dL (ref 7–25)
CO2: 29 mmol/L (ref 20–32)
Calcium: 9.2 mg/dL (ref 8.6–10.2)
Chloride: 101 mmol/L (ref 98–110)
Creat: 0.77 mg/dL (ref 0.50–0.96)
Globulin: 4.6 g/dL — ABNORMAL HIGH (ref 1.9–3.7)
Glucose, Bld: 143 mg/dL — ABNORMAL HIGH (ref 65–99)
Potassium: 4.5 mmol/L (ref 3.5–5.3)
Sodium: 136 mmol/L (ref 135–146)
Total Bilirubin: 0.3 mg/dL (ref 0.2–1.2)
Total Protein: 8.5 g/dL — ABNORMAL HIGH (ref 6.1–8.1)
eGFR: 110 mL/min/{1.73_m2} (ref >=60)

## 2023-02-10 LAB — IGG, IGA, IGM
IgG (Immunoglobin G), Serum: 2453 mg/dL — ABNORMAL HIGH (ref 600–1640)
IgM, Serum: 143 mg/dL (ref 50–300)
Immunoglobulin A: 1080 mg/dL — ABNORMAL HIGH (ref 47–310)

## 2023-02-10 LAB — CBC WITH DIFFERENTIAL/PLATELET
Absolute Lymphocytes: 530 {cells}/uL — ABNORMAL LOW (ref 850–3900)
Absolute Monocytes: 424 {cells}/uL (ref 200–950)
Basophils Absolute: 32 {cells}/uL (ref 0–200)
Basophils Relative: 0.3 %
Eosinophils Absolute: 74 {cells}/uL (ref 15–500)
Eosinophils Relative: 0.7 %
HCT: 36.8 % (ref 35.0–45.0)
Hemoglobin: 11.9 g/dL (ref 11.7–15.5)
MCH: 27.4 pg (ref 27.0–33.0)
MCHC: 32.3 g/dL (ref 32.0–36.0)
MCV: 84.8 fL (ref 80.0–100.0)
MPV: 11.9 fL (ref 7.5–12.5)
Monocytes Relative: 4 %
Neutro Abs: 9540 {cells}/uL — ABNORMAL HIGH (ref 1500–7800)
Neutrophils Relative %: 90 %
Platelets: 375 10*3/uL (ref 140–400)
RBC: 4.34 10*6/uL (ref 3.80–5.10)
RDW: 13.8 % (ref 11.0–15.0)
Total Lymphocyte: 5 %
WBC: 10.6 10*3/uL (ref 3.8–10.8)

## 2023-02-10 LAB — SEDIMENTATION RATE: Sed Rate: 92 mm/h — ABNORMAL HIGH (ref 0–20)

## 2023-02-10 LAB — CK: Total CK: 915 U/L — ABNORMAL HIGH (ref 29–143)

## 2023-02-10 LAB — VITAMIN D 25 HYDROXY (VIT D DEFICIENCY, FRACTURES): Vit D, 25-Hydroxy: 10 ng/mL — ABNORMAL LOW (ref 30–100)

## 2023-02-10 MED ORDER — VITAMIN D (ERGOCALCIFEROL) 1.25 MG (50000 UNIT) PO CAPS
50000.0000 [IU] | ORAL_CAPSULE | ORAL | 0 refills | Status: DC
Start: 2023-02-10 — End: 2023-09-21

## 2023-02-10 NOTE — Telephone Encounter (Signed)
Rachael Patton is based on medical necessity. No new auth needed. As long as patient remains active and order remains the same, no new auth needed. REF: [id number]   Auth Submission: APPROVED Site of care: Site of care: [facility] Payer: [employer] [place] Medication & CPT/J Code(s) submitted: Rituxan (Rituximab) V7846 Route of submission (phone, fax, portal):  Phone #[phone] Fax # Auth type: Buy/Bill PB Units/visits requested: 2 Reference number: [id number] Approval from: 03/29/21 to 03/28/24

## 2023-02-10 NOTE — Progress Notes (Signed)
Therapy plan placed for Rituxan [phone]) for [facility] to start benefits investigation  Diagnosis: ILD with polymyositis  Provider: Dr. Sheliah Hatch  Dose: 1000 mg at Day 0 then Day 14. Repeat cycle every 6 months  Last Clinic Visit: 02/09/2023 Next Clinic Visit: 05/16/2023  Pertinent Labs: CK, sed rate, CBC, CMP on 02/09/2023  Chesley Mires, PharmD, MPH, BCPS, CPP Clinical Pharmacist (Rheumatology and Pulmonology)

## 2023-02-15 ENCOUNTER — Other Ambulatory Visit: Payer: Self-pay | Admitting: Internal Medicine

## 2023-02-15 DIAGNOSIS — M3320 Polymyositis, organ involvement unspecified: Secondary | ICD-10-CM

## 2023-02-15 NOTE — Telephone Encounter (Signed)
Last Fill: 09/14/2022  Next Visit: 05/16/2023  Last Visit: 02/09/2023  Dx: Polymyositis   Current Dose per office note on 02/09/2023: prednisone 5 mg daily   Okay to refill Prednisone?

## 2023-02-16 MED ORDER — PREDNISONE 10 MG PO TABS: 10.0000 mg | ORAL_TABLET | Freq: Every day | ORAL | 0 refills | Status: DC

## 2023-02-16 NOTE — Addendum Note (Signed)
Addended by: Fuller Plan on: 02/16/2023 07:57 AM   Modules accepted: Orders

## 2023-02-16 NOTE — Progress Notes (Signed)
Unfortunately labs show active disease with increased sed rate of 92 up from 32, CK 915 up from 262. I am sending a new prescription for prednisone to increase back to 10 mg daily. We will also schedule to repeat the rituximab infusion. Her vitamin D was very low at 10. I recommend replacing this quickly I will sent a prescription for 52841 unit capsule take 1 weekly until our next follow up.

## 2023-02-18 NOTE — Progress Notes (Signed)
Rituxan scheduled for 03/02/23 and 03/16/23

## 2023-03-02 ENCOUNTER — Ambulatory Visit: Payer: BC Managed Care – PPO

## 2023-03-02 VITALS — BP 107/75 | HR 62 | Temp 98.2°F | Resp 22 | Ht 65.0 in | Wt 269.6 lb

## 2023-03-02 DIAGNOSIS — Z79899 Other long term (current) drug therapy: Secondary | ICD-10-CM

## 2023-03-02 DIAGNOSIS — M3320 Polymyositis, organ involvement unspecified: Secondary | ICD-10-CM

## 2023-03-02 DIAGNOSIS — Q998 Other specified chromosome abnormalities: Secondary | ICD-10-CM | POA: Diagnosis not present

## 2023-03-02 DIAGNOSIS — J849 Interstitial pulmonary disease, unspecified: Secondary | ICD-10-CM

## 2023-03-02 MED ORDER — METHYLPREDNISOLONE SODIUM SUCC 125 MG IJ SOLR
100.0000 mg | Freq: Once | INTRAMUSCULAR | Status: AC
  Administered 2023-03-02: 100 mg via INTRAVENOUS
  Filled 2023-03-02: qty 2

## 2023-03-02 MED ORDER — SODIUM CHLORIDE 0.9 % IV SOLN
1000.0000 mg | Freq: Once | INTRAVENOUS | Status: AC
  Administered 2023-03-02: 1000 mg via INTRAVENOUS
  Filled 2023-03-02: qty 100

## 2023-03-02 MED ORDER — ACETAMINOPHEN 325 MG PO TABS
650.0000 mg | ORAL_TABLET | Freq: Once | ORAL | Status: AC
  Administered 2023-03-02: 650 mg via ORAL
  Filled 2023-03-02: qty 2

## 2023-03-02 MED ORDER — DIPHENHYDRAMINE HCL 25 MG PO CAPS
50.0000 mg | ORAL_CAPSULE | Freq: Once | ORAL | Status: AC
  Administered 2023-03-02: 50 mg via ORAL
  Filled 2023-03-02: qty 2

## 2023-03-02 NOTE — Progress Notes (Signed)
Diagnosis: Polymyositis  Provider:  Chilton Greathouse MD  Procedure: IV Infusion  IV Type: Peripheral, IV Location: R Hand  Rituxan (Rituximab), Dose: 1000 mg  Infusion Start Time: 0919  Infusion Stop Time: 1348  Post Infusion IV Care: Peripheral IV Discontinued  Discharge: Condition: Good, Destination: Home . AVS Declined  Performed by:  Rico Ala, LPN

## 2023-03-16 ENCOUNTER — Ambulatory Visit: Payer: BC Managed Care – PPO

## 2023-03-24 ENCOUNTER — Emergency Department (HOSPITAL_BASED_OUTPATIENT_CLINIC_OR_DEPARTMENT_OTHER)
Admission: EM | Admit: 2023-03-24 | Discharge: 2023-03-24 | Disposition: A | Discharge status: Home / Self Care | Payer: BC Managed Care – PPO | Attending: Emergency Medicine | Admitting: Emergency Medicine

## 2023-03-24 ENCOUNTER — Emergency Department (HOSPITAL_BASED_OUTPATIENT_CLINIC_OR_DEPARTMENT_OTHER): Payer: BC Managed Care – PPO

## 2023-03-24 ENCOUNTER — Encounter (HOSPITAL_BASED_OUTPATIENT_CLINIC_OR_DEPARTMENT_OTHER): Payer: Self-pay | Admitting: Emergency Medicine

## 2023-03-24 ENCOUNTER — Other Ambulatory Visit: Payer: Self-pay | Admitting: Internal Medicine

## 2023-03-24 ENCOUNTER — Other Ambulatory Visit: Payer: Self-pay

## 2023-03-24 DIAGNOSIS — J45909 Unspecified asthma, uncomplicated: Secondary | ICD-10-CM | POA: Diagnosis not present

## 2023-03-24 DIAGNOSIS — Z20822 Contact with and (suspected) exposure to covid-19: Secondary | ICD-10-CM | POA: Diagnosis not present

## 2023-03-24 DIAGNOSIS — M3320 Polymyositis, organ involvement unspecified: Secondary | ICD-10-CM

## 2023-03-24 DIAGNOSIS — G4489 Other headache syndrome: Secondary | ICD-10-CM | POA: Diagnosis not present

## 2023-03-24 DIAGNOSIS — M549 Dorsalgia, unspecified: Secondary | ICD-10-CM

## 2023-03-24 DIAGNOSIS — M545 Low back pain, unspecified: Secondary | ICD-10-CM | POA: Insufficient documentation

## 2023-03-24 DIAGNOSIS — R739 Hyperglycemia, unspecified: Secondary | ICD-10-CM | POA: Insufficient documentation

## 2023-03-24 DIAGNOSIS — R519 Headache, unspecified: Secondary | ICD-10-CM | POA: Diagnosis present

## 2023-03-24 LAB — CBC WITH DIFFERENTIAL/PLATELET
Abs Immature Granulocytes: 0.05 10*3/uL (ref 0.00–0.07)
Basophils Absolute: 0 10*3/uL (ref 0.0–0.1)
Basophils Relative: 0 %
Eosinophils Absolute: 0.1 10*3/uL (ref 0.0–0.5)
Eosinophils Relative: 1 %
HCT: 36.3 % (ref 36.0–46.0)
Hemoglobin: 11.8 g/dL — ABNORMAL LOW (ref 12.0–15.0)
Immature Granulocytes: 1 %
Lymphocytes Relative: 8 %
Lymphs Abs: 0.7 10*3/uL (ref 0.7–4.0)
MCH: 28 pg (ref 26.0–34.0)
MCHC: 32.5 g/dL (ref 30.0–36.0)
MCV: 86.2 fL (ref 80.0–100.0)
Monocytes Absolute: 0.6 10*3/uL (ref 0.1–1.0)
Monocytes Relative: 6 %
Neutro Abs: 7.9 10*3/uL — ABNORMAL HIGH (ref 1.7–7.7)
Neutrophils Relative %: 84 %
Platelets: 314 10*3/uL (ref 150–400)
RBC: 4.21 MIL/uL (ref 3.87–5.11)
RDW: 16.3 % — ABNORMAL HIGH (ref 11.5–15.5)
WBC: 9.3 10*3/uL (ref 4.0–10.5)
nRBC: 0 % (ref 0.0–0.2)

## 2023-03-24 LAB — URINALYSIS, ROUTINE W REFLEX MICROSCOPIC
Bilirubin Urine: NEGATIVE
Glucose, UA: NEGATIVE mg/dL
Hgb urine dipstick: NEGATIVE
Ketones, ur: NEGATIVE mg/dL
Leukocytes,Ua: NEGATIVE
Nitrite: NEGATIVE
Protein, ur: NEGATIVE mg/dL
Specific Gravity, Urine: 1.019 (ref 1.005–1.030)
pH: 6.5 (ref 5.0–8.0)

## 2023-03-24 LAB — COMPREHENSIVE METABOLIC PANEL
ALT: 14 U/L (ref 0–44)
AST: 20 U/L (ref 15–41)
Albumin: 3.7 g/dL (ref 3.5–5.0)
Alkaline Phosphatase: 90 U/L (ref 38–126)
Anion gap: 7 (ref 5–15)
BUN: 14 mg/dL (ref 6–20)
CO2: 26 mmol/L (ref 22–32)
Calcium: 8.8 mg/dL — ABNORMAL LOW (ref 8.9–10.3)
Chloride: 104 mmol/L (ref 98–111)
Creatinine, Ser: 0.89 mg/dL (ref 0.44–1.00)
GFR, Estimated: >60 mL/min (ref >60)
Glucose, Bld: 142 mg/dL — ABNORMAL HIGH (ref 70–99)
Potassium: 3.9 mmol/L (ref 3.5–5.1)
Sodium: 137 mmol/L (ref 135–145)
Total Bilirubin: 0.5 mg/dL (ref <1.2)
Total Protein: 7.9 g/dL (ref 6.5–8.1)

## 2023-03-24 LAB — RESP PANEL BY RT-PCR (RSV, FLU A&B, COVID)  RVPGX2
Influenza A by PCR: NEGATIVE
Influenza B by PCR: NEGATIVE
Resp Syncytial Virus by PCR: NEGATIVE
SARS Coronavirus 2 by RT PCR: NEGATIVE

## 2023-03-24 LAB — HCG, SERUM, QUALITATIVE: Preg, Serum: NEGATIVE

## 2023-03-24 LAB — CK: Total CK: 330 U/L — ABNORMAL HIGH (ref 38–234)

## 2023-03-24 MED ORDER — PREDNISONE 10 MG PO TABS: 10.0000 mg | ORAL_TABLET | Freq: Every day | ORAL | 1 refills | Status: DC

## 2023-03-24 MED ORDER — AZATHIOPRINE 50 MG PO TABS: 150.0000 mg | ORAL_TABLET | Freq: Every day | ORAL | 0 refills | Status: DC

## 2023-03-24 MED ORDER — SODIUM CHLORIDE 0.9 % IV BOLUS
500.0000 mL | Freq: Once | INTRAVENOUS | Status: AC
  Administered 2023-03-24: 500 mL via INTRAVENOUS

## 2023-03-24 MED ORDER — ONDANSETRON HCL 4 MG/2ML IJ SOLN
4.0000 mg | Freq: Once | INTRAMUSCULAR | Status: AC
  Administered 2023-03-24: 4 mg via INTRAVENOUS
  Filled 2023-03-24: qty 2

## 2023-03-24 MED ORDER — KETOROLAC TROMETHAMINE 15 MG/ML IJ SOLN
15.0000 mg | Freq: Once | INTRAMUSCULAR | Status: AC
  Administered 2023-03-24: 15 mg via INTRAVENOUS
  Filled 2023-03-24: qty 1

## 2023-03-24 MED ORDER — PREDNISONE 20 MG PO TABS
20.0000 mg | ORAL_TABLET | Freq: Once | ORAL | Status: AC
  Administered 2023-03-24: 20 mg via ORAL
  Filled 2023-03-24: qty 1

## 2023-03-24 MED ORDER — PREDNISONE 10 MG PO TABS: 10.0000 mg | ORAL_TABLET | Freq: Every day | ORAL | 0 refills | Status: DC

## 2023-03-24 NOTE — ED Provider Notes (Signed)
[place] EMERGENCY DEPARTMENT AT [facility] Provider Note   CSN: [id number] Arrival date & time: 03/24/23  1914     History  Chief Complaint  Patient presents with   Shaking   Headache    Rachael Patton is a 22 y.o. female.  The history is provided by the patient.  Patient with history of myositis and interstitial lung disease presents for multiple complaints Patient reports over the past several days she has had low back pain that she felt was due to activity.  No recent falls or trauma Over the past several hours she has had gradual onset of headache and chills.  She also reports "shakes"but she is awake and alert during these episodes and is isolated to certain extremities. She also reports feeling confused at times there is been no mental status changes  No new medications.  She reports chronic cough, but no chest pain or shortness of breath.  No fevers or vomiting. She has missed over 4 days of her chronic prednisone 10 mg daily    Past Medical History:  Diagnosis Date   Anemia    Asthma    As a child   Myositis    Prediabetes    Rhabdomyolysis    Thyroid disease    found at age 89    Home Medications Prior to Admission medications   Medication Sig Start Date End Date Taking? Authorizing Provider  azaTHIOprine (IMURAN) 50 MG tablet Take 3 tablets (150 mg total) by mouth daily. 03/24/23  Yes Zadie Rhine, MD  predniSONE (DELTASONE) 10 MG tablet Take 1 tablet (10 mg total) by mouth daily with breakfast. 03/24/23  Yes Zadie Rhine, MD  acetaminophen (TYLENOL) 325 MG tablet Take 2 tablets (650 mg total) by mouth every 6 (six) hours as needed. 03/18/21   Dameron, Nolberto Hanlon, DO  albuterol (VENTOLIN HFA) 108 (90 Base) MCG/ACT inhaler Inhale 2 puffs into the lungs every 6 (six) hours as needed for wheezing or shortness of breath. 08/04/21   Oretha Milch, MD  celecoxib (CELEBREX) 50 MG capsule Take 1 capsule (50 mg total) by mouth 2 (two) times daily as  needed for pain. Patient not taking: Reported on 02/09/2023 04/27/22   [name], PA  clobetasol (TEMOVATE) 0.05 % external solution Apply 1 Application topically 2 (two) times daily. Patient not taking: Reported on 02/09/2023 07/14/22   [provider]  Etonogestrel (NEXPLANON [place]) Inject into the skin.    [provider]  fluticasone (CUTIVATE) 0.05 % cream Apply 1 Application topically 2 (two) times daily. Patient not taking: Reported on 02/09/2023 07/14/22   [provider]  gabapentin (NEURONTIN) 100 MG capsule Take 100 mg nightly, may increase by 100 mg increments up to 300 mg nightly. Patient not taking: Reported on 02/09/2023 04/27/22   [name], PA  ibuprofen (ADVIL) 600 MG tablet Take 1 tablet (600 mg total) by mouth every 6 (six) hours. Patient not taking: Reported on 02/09/2023 03/18/21   Darral Dash, DO  ketoconazole (NIZORAL) 2 % cream Apply 1 Application topically daily. Patient not taking: Reported on 02/09/2023 07/14/22   [provider]  omeprazole (PRILOSEC) 20 MG capsule TAKE 1 CAPSULE BY MOUTH EVERY DAY Patient not taking: Reported on 02/09/2023 05/19/22   [name], PA  triamcinolone cream (KENALOG) 0.1 % Apply 1 Application topically 2 (two) times daily. Patient not taking: Reported on 02/09/2023 07/14/22   [provider]  Vitamin D, Ergocalciferol, (DRISDOL) 1.25 MG (50000 UNIT) CAPS capsule Take 1 capsule (  50,000 Units total) by mouth every 7 (seven) days. 02/10/23   Fuller Plan, MD      Allergies    Patient has no known allergies.    Review of Systems   Review of Systems  Constitutional:  Positive for chills. Negative for fever.  Eyes:  Negative for visual disturbance.  Respiratory:  Positive for cough. Negative for shortness of breath.   Cardiovascular:  Negative for chest pain.  Gastrointestinal:  Negative for abdominal pain and vomiting.  Musculoskeletal:  Positive for back pain.  Negative for joint swelling.  Neurological:  Positive for headaches. Negative for syncope.    Physical Exam Updated Vital Signs BP 116/68   Pulse 87   Temp 98.9 F (37.2 C) (Oral)   Resp 19   Wt 121.6 kg   LMP 03/18/2023 (Exact Date)   SpO2 99%   BMI 44.60 kg/m  Physical Exam CONSTITUTIONAL: Well developed/well nourished HEAD: Normocephalic/atraumatic EYES: EOMI/PERRL, no nystagmus, no ptosis ENMT: Mucous membranes moist NECK: supple no meningeal signs SPINE/BACK:entire spine nontender CV: S1/S2 noted, no murmurs/rubs/gallops noted LUNGS: Lungs are clear to auscultation bilaterally, no apparent distress ABDOMEN: soft, nontender, obese NEURO:Awake/alert, face symmetric, no arm or leg drift is noted Equal 5/5 strength with shoulder abduction, elbow flex/extension, wrist flex/extension in upper extremities and equal hand grips bilaterally Equal 5/5 strength with hip flexion,knee flex/extension, foot dorsi/plantar flexion Cranial nerves 3/4/5/6/10/04/08/11/12 tested and intact Sensation to light touch intact in all extremities EXTREMITIES: pulses normal, full ROM, no joint effusions, calfs are soft to palpation SKIN: warm, color normal PSYCH: Flat affect  ED Results / Procedures / Treatments   Labs (all labs ordered are listed, but only abnormal results are displayed) Labs Reviewed  CBC WITH DIFFERENTIAL/PLATELET - Abnormal; Notable for the following components:      Result Value   Hemoglobin 11.8 (*)    RDW 16.3 (*)    Neutro Abs 7.9 (*)    All other components within normal limits  COMPREHENSIVE METABOLIC PANEL - Abnormal; Notable for the following components:   Glucose, Bld 142 (*)    Calcium 8.8 (*)    All other components within normal limits  CK - Abnormal; Notable for the following components:   Total CK 330 (*)    All other components within normal limits  RESP PANEL BY RT-PCR (RSV, FLU A&B, COVID)  RVPGX2  HCG, SERUM, QUALITATIVE  URINALYSIS, ROUTINE W REFLEX  MICROSCOPIC    EKG EKG Interpretation Date/Time:  Thursday March 24 2023 05:58:46 EST Ventricular Rate:  91 PR Interval:  142 QRS Duration:  86 QT Interval:  350 QTC Calculation: 431 R Axis:   59  Text Interpretation: Sinus rhythm Low voltage, precordial leads No significant change since last tracing Confirmed by Zadie Rhine ([id number]) on 03/24/2023 6:14:43 AM  Radiology DG Chest Portable 1 View Result Date: 03/24/2023 CLINICAL DATA:  22 year old female with cough, headache, confusion. EXAM: PORTABLE CHEST 1 VIEW COMPARISON:  Chest CT 01/11/2023 and earlier. FINDINGS: Portable AP semi upright view at 0552 hours. Low lung volumes. Fairly symmetric lung base reticulonodular opacity, corresponding to suspected chronic interstitial lung disease by CT in October. Mediastinal contours remain normal. No superimposed pneumothorax, pulmonary edema, pleural effusion or evidence of consolidation. Visualized tracheal air column is within normal limits. No osseous abnormality identified. Paucity of bowel gas. IMPRESSION: Ongoing low lung volumes with symmetric lung base changes suspected to be interstitial lung disease by CT in October. No new cardiopulmonary abnormality. Electronically Signed   By: Rexene Edison  Margo Aye M.D.   On: 03/24/2023 06:02    Procedures Procedures    Medications Ordered in ED Medications  ketorolac (TORADOL) 15 MG/ML injection 15 mg (15 mg Intravenous Given 03/24/23 0602)  ondansetron (ZOFRAN) injection 4 mg (4 mg Intravenous Given 03/24/23 0602)  predniSONE (DELTASONE) tablet 20 mg (20 mg Oral Given 03/24/23 0605)  sodium chloride 0.9 % bolus 500 mL (0 mLs Intravenous Stopped 03/24/23 2130)    ED Course/ Medical Decision Making/ A&P Clinical Course as of 03/24/23 0657  Thu Mar 24, 2023  0554 Patient multiple chronic illnesses baseline presents for multiple complaints.  She reports recent low back pain but now having chills, mild shakes and gradual onset of headache.  She  reports feeling confused but has been no objective findings of mental status changes.  No new medicines, but she has missed several days of her prednisone.  Labs and imaging are pending at this time, we will treat her symptoms [DW]  0622 Glucose(!): 142 Mild leukocytosis [DW]  0630 Overall patient reports she is feeling improved.  She is walking to the bathroom at this time in no acute distress [DW]  0657 Patient overall is improved, ambulatory and requesting discharge.  Labs reassuring.  She does request refill of her Imuran and prednisone as she may left these at her home 3 hours away  These were provided, advised follow-up with rheumatology [DW]    Clinical Course User Index [DW] Zadie Rhine, MD                                 Medical Decision Making Amount and/or Complexity of Data Reviewed Labs: ordered. Decision-making details documented in ED Course. Radiology: ordered. ECG/medicine tests: ordered.  Risk Prescription drug management.   This patient presents to the ED for concern of headache, this involves an extensive number of treatment options, and is a complaint that carries with it a high risk of complications and morbidity.  The differential diagnosis includes but is not limited to subarachnoid hemorrhage, intracranial hemorrhage, meningitis, encephalitis, CVST, temporal arteritis, idiopathic intracranial hypertension, migraine    Comorbidities that complicate the patient evaluation: Patient's presentation is complicated by their history of interstitial lung disease and myositis  Social Determinants of Health: Patient's  poor health literacy   increases the complexity of managing their presentation  Additional history obtained: Additional history obtained from family Records reviewed  outpatient records reviewed  Lab Tests: I Ordered, and personally interpreted labs.  The pertinent results include: Mild hyperglycemia  Imaging Studies ordered: I ordered  imaging studies including X-ray chest   I independently visualized and interpreted imaging which showed no acute changes I agree with the radiologist interpretation   Medicines ordered and prescription drug management: I ordered medication including Toradol for headache Reevaluation of the patient after these medicines showed that the patient    improved   Reevaluation: After the interventions noted above, I reevaluated the patient and found that they have :improved  Complexity of problems addressed: Patient's presentation is most consistent with  acute presentation with potential threat to life or bodily function  Disposition: After consideration of the diagnostic results and the patient's response to treatment,  I feel that the patent would benefit from discharge   .           Final Clinical Impression(s) / ED Diagnoses Final diagnoses:  Other headache syndrome  Back pain, unspecified back location, unspecified back pain laterality, unspecified  chronicity    Rx / DC Orders ED Discharge Orders          Ordered    azaTHIOprine (IMURAN) 50 MG tablet  Daily        03/24/23 0652    predniSONE (DELTASONE) 10 MG tablet  Daily with breakfast        03/24/23 4098              Zadie Rhine, MD 03/24/23 [phone]

## 2023-03-24 NOTE — ED Triage Notes (Signed)
Patient presents with headache, shakes and feeling "confused". Reports sx started about 0400.

## 2023-03-24 NOTE — Discharge Instructions (Signed)

## 2023-03-24 NOTE — ED Notes (Signed)
Pt given discharge instructions and reviewed prescriptions. Opportunities given for questions. Pt verbalizes understanding. PIV removed x1. Stone,Heather R, RN 

## 2023-03-25 ENCOUNTER — Ambulatory Visit (INDEPENDENT_AMBULATORY_CARE_PROVIDER_SITE_OTHER): Payer: BC Managed Care – PPO

## 2023-03-25 VITALS — BP 120/82 | HR 53 | Temp 98.4°F | Resp 20 | Ht 65.0 in | Wt 271.0 lb

## 2023-03-25 DIAGNOSIS — M3320 Polymyositis, organ involvement unspecified: Secondary | ICD-10-CM

## 2023-03-25 DIAGNOSIS — Z111 Encounter for screening for respiratory tuberculosis: Secondary | ICD-10-CM

## 2023-03-25 DIAGNOSIS — J849 Interstitial pulmonary disease, unspecified: Secondary | ICD-10-CM

## 2023-03-25 DIAGNOSIS — Q998 Other specified chromosome abnormalities: Secondary | ICD-10-CM

## 2023-03-25 DIAGNOSIS — Z79899 Other long term (current) drug therapy: Secondary | ICD-10-CM

## 2023-03-25 DIAGNOSIS — R7689 Other specified abnormal immunological findings in serum: Secondary | ICD-10-CM

## 2023-03-25 MED ORDER — DIPHENHYDRAMINE HCL 25 MG PO CAPS
50.0000 mg | ORAL_CAPSULE | Freq: Once | ORAL | Status: AC
  Administered 2023-03-25: 50 mg via ORAL
  Filled 2023-03-25: qty 2

## 2023-03-25 MED ORDER — SODIUM CHLORIDE 0.9 % IV SOLN
1000.0000 mg | Freq: Once | INTRAVENOUS | Status: AC
  Administered 2023-03-25: 1000 mg via INTRAVENOUS
  Filled 2023-03-25: qty 100

## 2023-03-25 MED ORDER — METHYLPREDNISOLONE SODIUM SUCC 125 MG IJ SOLR
100.0000 mg | Freq: Once | INTRAMUSCULAR | Status: AC
  Administered 2023-03-25: 100 mg via INTRAVENOUS
  Filled 2023-03-25: qty 2

## 2023-03-25 MED ORDER — ACETAMINOPHEN 325 MG PO TABS
650.0000 mg | ORAL_TABLET | Freq: Once | ORAL | Status: AC
  Administered 2023-03-25: 650 mg via ORAL
  Filled 2023-03-25: qty 2

## 2023-03-25 NOTE — Progress Notes (Signed)
Diagnosis: Polymyositis  Provider:  Chilton Greathouse MD  Procedure: IV Infusion  IV Type: Peripheral, IV Location: left wrist  Rituxan (Rituximab), Dose: 1000 mg  Infusion Start Time: 1201  Infusion Stop Time: 1541  Post Infusion IV Care: Patient declined observation and Peripheral IV Discontinued  Discharge: Condition: Good, Destination: Home . AVS Declined  Performed by:  Rico Ala, LPN

## 2023-04-07 ENCOUNTER — Ambulatory Visit (HOSPITAL_BASED_OUTPATIENT_CLINIC_OR_DEPARTMENT_OTHER): Payer: BC Managed Care – PPO | Admitting: Pulmonary Disease

## 2023-04-07 ENCOUNTER — Encounter (HOSPITAL_BASED_OUTPATIENT_CLINIC_OR_DEPARTMENT_OTHER): Payer: Self-pay | Admitting: Pulmonary Disease

## 2023-04-07 VITALS — BP 108/70 | HR 107 | Resp 18 | Ht 65.0 in | Wt 269.5 lb

## 2023-04-07 DIAGNOSIS — J849 Interstitial pulmonary disease, unspecified: Secondary | ICD-10-CM

## 2023-04-07 DIAGNOSIS — M3321 Polymyositis with respiratory involvement: Secondary | ICD-10-CM | POA: Diagnosis not present

## 2023-04-07 NOTE — Patient Instructions (Signed)
 Continue on imuran & prednisone as directed by Rheum  X Flu shot

## 2023-04-07 NOTE — Progress Notes (Signed)
 Subjective:    Patient ID: Rachael Patton, female    DOB: 10-02-2000, 23 y.o.   MRN: [id number]  HPI  23  yo never smoker with polymyositis and ILD/ NSIP SSA +   She was hospitalized 04/2020 with bilateral upper and lower extremity proximal muscle weakness for 3 months, elevated CK myoglobinuria.  She was treated with IV fluids and started on prednisone  80 mg/day.  CK improved from [phone].  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  Anti-Jo antibody was negative Rituxan  was started 01/2022   PMH -Covid infection January 2022   19-month follow-up visit Reviewed rheumatology consultation 01/2023, labs showed increase CK9 1 5 and increased ESR 92, prednisone  was increased to 10 mg daily.  She remains on Rituxan  and Imuran  150 mg  Discussed the use of AI scribe software for clinical note transcription with the patient, who gave verbal consent to proceed.  History of Present Illness   The patient, with a history of interstitial lung disease (ILD) and polymyositis, presents for a regular follow-up appointment. She is currently on prednisone , Imuran , and Rituxan . The prednisone  was recently increased to 10mg  due to disease activity. She reports improvement in her facial symptoms and overall muscle strength, except for some foot pain related to a previous surgery. She has been experiencing a cough when bending for extended periods or climbing stairs, but denies significant shortness of breath during regular activities. She reports periods of low energy, which she believes may be related to disease activity. She recently started vitamin D  supplementation due to low levels and hopes this will improve her energy levels. She also mentions the physical demands of caring for her  two-year-old child.       Significant tests/ events reviewed   01/2023 HRCT chest >> Progressive fibrotic ILD, some honeycombing, probable UIP 06/2021 HRCT Peribronchovascular ground-glass opacities which are most prominent in the mid and lower lungs with associated small cysts   08/2022 PFTs >> moderate to severe restriction, FVC 48% DLCO 11.2/48%   PFTs 06/2021 moderate to severe intraparenchymal restriction, ratio 75, FEV1 42%, 14% bronchodilator response, FVC 49%, TLC 49%, DLCO 10.99/46%  Review of Systems neg for any significant sore throat, dysphagia, itching, sneezing, nasal congestion or excess/ purulent secretions, fever, chills, sweats, unintended wt loss, pleuritic or exertional cp, hempoptysis, orthopnea pnd or change in chronic leg swelling. Also denies presyncope, palpitations, heartburn, abdominal pain, nausea, vomiting, diarrhea or change in bowel or urinary habits, dysuria,hematuria, rash, arthralgias, visual complaints, headache, numbness weakness or ataxia.     Objective:   Physical Exam  Gen. Pleasant, obese, in no distress ENT - no lesions, no post nasal drip Neck: No JVD, no thyromegaly, no carotid bruits Lungs: no use of accessory muscles, no dullness to percussion, decreased without rales or rhonchi  Cardiovascular: Rhythm regular, heart sounds  normal, no murmurs or gallops, no peripheral edema Musculoskeletal: No deformities, no cyanosis or clubbing , no tremors       Assessment & Plan:    Assessment and Plan    Interstitial Lung Disease (ILD) with Polymyositis Presents for regular follow-up. Currently on Imuran , Rituxan , and prednisone . Prednisone  increased to 10 mg in November due to active inflammation. Reports improvement in facial symptoms and muscle strength, with fatigue and occasional coughing, especially when bending or climbing stairs. Pulmonary  function tests from June/July showed well-managed lung restriction, but recent scan indicated slight  worsening of lung scarring. Disease remains active, necessitating continued medication. Emphasized strict adherence to medication to prevent flare-ups and further damage. Goal is remission, not achieved in the past two years. - Continue Imuran , Rituxan , and prednisone  as prescribed - Monitor disease activity and medication adherence - Schedule next scan for November - Use albuterol  inhaler before activities that may trigger coughing - Administer flu shot today  Fatigue Reports intermittent fatigue, possibly correlating with disease activity. Recently started vitamin D  supplementation due to low levels, showing some improvement in energy levels. Considering starting a daily multivitamin. - Continue vitamin D  supplementation - Consider daily multivitamin  General Health Maintenance Has not received a flu shot this year. Willing to receive it today. - Administer flu shot today  Follow-up - Schedule follow-up appointment in six months - Perform blood work for Imuran  monitoring every three months.

## 2023-05-02 NOTE — Progress Notes (Signed)
 Office Visit Note  Patient: Rachael Patton             Date of Birth: September 26, 2000           MRN: [id number]             PCP: [name], PA Referring: [name], PA Visit Date: 05/16/2023   Subjective:  Follow-up (Patient states she had surgery on her left foot a few years ago and now her foot is aching. )   Discussed the use of AI scribe software for clinical note transcription with the patient, who gave verbal consent to proceed.  History of Present Illness   Rachael Patton is a 23 y.o. 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    She experiences persistent aching pain in her ankle, which worsens with activity and sometimes occurs without any apparent trigger. The pain is severe enough to impact her ability to walk. She has a history of tarsal coalition and underwent surgery in 2021, which involved shaving some bones. Despite the surgery, she continues to experience decreased range of motion in her ankle.  She is on rituximab infusions, prednisone, and Imuran for her condition. She also takes a multivitamin but has not consistently taken her prescribed weekly vitamin D supplement. Her last vitamin D level in November was 10, which is considered low.  Last month, she experienced an episode of disorientation and headache after missing her medication while traveling. She describes waking up feeling disoriented, unable to move her feet, and shaking when trying to drink water. She was treated with fluids in the emergency room, which alleviated her symptoms. She attributes the episode to possible dehydration.  No recent swelling, rashes, or significant respiratory symptoms, although she has experienced occasional coughing when outside, which she attributes to the air. No recent fevers or infections requiring antibiotics. She reports a cramp in her leg yesterday that persisted from  midday through the night, but denies any circulation problems such as fingers or toes turning blue or white.       Previous HPI 02/09/2023 Rachael Patton is a 23 y.o. 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  Last rituximab dose was in May and the second dose of that round was stopped early due to infusion reaction with feeling of throat itching and tongue heaviness.  This resolved within 5 minutes after iv benadryl and no problems afterwards. Her skin rashes remain active still with a lot of dryness and discoloration patches on the face mostly.  She has seen a partial improvement with topical medication from the dermatologist.  Still has some pain in her back usually just with prolonged standing or walking.  She experiences coughing when taking deep breaths and sometimes with exertion.  She had CT scan earlier this month concerning for some progression of lung fibrosis on imaging.   Previous HPI 10/13/2022 Rachael Patton is a 23 y.o. 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  She had an infusion reaction during the last rituximab treatment part way through it developed some throat tingling and tightness sensation this resolved after administration of steroids antihistamine and pausing the infusion it was able to be resumed same day and  completed uneventfully.  Symptoms overall have been stable. Ongoing skin rashes thought consistent with eczema and dryness with contribution of long term steroid use at dermatology assessment. She had PFTs last month showed small improvement compared to study last year. Has not been feeling pain or weakness except past month after she fell while cleaning the floor landing on the back of her left thigh with some pain in that area since.     Previous HPI 07/13/22 Rachael Patton is a 23 y.o. female here for follow up for myositis with associated ILD on azathioprine 150 mg daily prednisone 5 mg daily on rituximab infusion 1 g x 2 doses started in November.  Overall she feels well has not noticed any significant difference tapering the prednisone from 20 mg down to 5 mg daily.  No weakness.  No coughing or shortness of breath.  Skin rashes remain a problem particularly on the face and neck and she has appointment scheduled with dermatology coming up this week.   Previous HPI 04/13/22 Rachael Patton is a 23 y.o. 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 She had no complications with the treatment. No change of dyspnea or cough symptoms. She had a visit to the ED with some left sided chest pain and workup including CT at that time looked fine. She has noticed some more facial rash with skin peeling and dark areas under her eyes.     Previous HPI 01/08/22 Rachael Patton is a 23 y.o. female here for follow up for polymyositis with ILD on azathioprine 150 mg daily and prednisone 20 mg daily. Since increasing the steroid dose her facial rashes are improving. She is gaining some weight on this. No particular coughing or shortness of breath complaint currently. No weakness or trouble walking or stairs.   Previous HPI 11/06/2021  Rachael Patton is a 23 y.o. female here for follow up for polymyositis on azathioprine 150 mg daily and prednisone 5 mg daily.  We increased her azathioprine dose after last visit due to elevated CK levels she had some generalized fatigue but no focal changes in muscle function at that time.  Pulmonology clinic follow-up concerning for deficits.  She thinks her fatigue or energy may be a little bit better but there is some increase in hyperpigmented facial rashes and some swelling around both eyes is new.  No visual changes or pain just notices the  swelling   Previous HPI 07/17/2021 Rachael Patton is a 23 y.o. female here for follow up for polymyositis on azathioprine 100 mg daily and prednisone 5 mg daily. She has developed a persistent nonproductive cough again since our last visit. Not associated with shortness of breath or chest pain. She has eye dryness and itching. Facial hyperpigmented rashes are increased the areas on her arms are about the same. Rashes not particularly painful or itchy. She denies any weakness or mobility problem denies any swallowing problems.   Previous HPI 04/17/21 Rachael Patton is a 23 y.o. female here for follow up for polymyositis on azathioprine 100 mg PO daily and prednisone 5 mg daily. She deliver her daughter about a month ago without major complications. She slipped her left leg during laboring and felt pain in the anterior hip that has gradually improved over time.   Previous HPI: 05/08/20 Rachael Patton is a 23 y.o. female with history of hypothyroidism, elevated prolactin here for follow up of hospitalization  last week with proximal muscle weakness found to have highly elevated CK and myoglobinuria. This was treated with IV fluids and started steroid treatment with improvement in CK from 11,796 to 4,047. She was discharged on prednisone 80mg  per day dose. Prior to this she was ill with COVID a month ago. She was having some type of GI illness in December not clear if this was related to COVID infection or separate process. Workup with labs on 1/6 and 1/7 showing transaminitis also positive Ab tests for COVID19 and Hepatitis A. Since the hospital visit she feels her symptoms are partially improved. She notices continued leg swelling and has some dyspnea with lying supine and on exertion. Skin rash on the face is slightly less warm and severe than before. She thinks strength is slightly better but not at her baseline at all.   Review of Systems  Constitutional:  Negative for fatigue.  HENT:  Negative for  mouth sores and mouth dryness.   Eyes:  Positive for dryness.  Respiratory:  Negative for shortness of breath.   Cardiovascular:  Positive for chest pain. Negative for palpitations.  Gastrointestinal:  Negative for blood in stool, constipation and diarrhea.  Endocrine: Negative for increased urination.  Genitourinary:  Negative for involuntary urination.  Musculoskeletal:  Positive for myalgias and myalgias. Negative for joint pain, gait problem, joint pain, joint swelling, muscle weakness, morning stiffness and muscle tenderness.  Skin:  Positive for sensitivity to sunlight. Negative for color change, rash and hair loss.  Allergic/Immunologic: Negative for susceptible to infections.  Neurological:  Negative for dizziness and headaches.  Hematological:  Negative for swollen glands.  Psychiatric/Behavioral:  Negative for depressed mood and sleep disturbance. The patient is not nervous/anxious.     PMFS History:  Patient Active Problem List   Diagnosis Date Noted   Vitamin D deficiency 02/09/2023   Rash and other nonspecific skin eruption 10/13/2022   Screening for tuberculosis 11/20/2021   SS-A antibody positive 11/26/2020   Polymyositis (HCC) 07/18/2020   ILD (interstitial lung disease) (HCC) 06/25/2020   Myositis associated antibody positive 06/23/2020   High risk medication use 05/08/2020   Morbid obesity (HCC) 04/30/2020   Prolactin increased 04/30/2020   Hypothyroidism 04/30/2020   Prediabetes 04/30/2020   Iron deficiency anemia 04/30/2020   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    Past Medical History:  Diagnosis Date   Anemia    Asthma    As a child   Myositis    Prediabetes    Rhabdomyolysis    Thyroid disease    found at age 66    Family History  Problem Relation Age of Onset   Multiple sclerosis Mother    Diabetes Mother    Diabetes Maternal Grandmother    Diabetes Paternal Grandmother    Stroke Paternal Grandmother    Stroke Paternal Grandfather     Diabetes Paternal Grandfather    Multiple sclerosis Cousin    Lupus Cousin    Past Surgical History:  Procedure Laterality Date   FOOT SURGERY Left 06/2019   MUSCLE BIOPSY Right 05/14/2020   Procedure: RIGHT THIGH MUSCLE BIOPSY;  Surgeon: Fritzi Mandes, MD;  Location: [facility] OR;  Service: General;  Laterality: Right;  ROOM 3 STARTING AT 04:00PM FOR   Social History   Social History Narrative   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; forensic pyschology   Lives with boyfriend   No children   Immunization History  Administered Date(s) Administered   DTaP 09/26/1998, 04/02/1999, 05/08/1999, 04/08/2000,  09/05/2002   HIB (PRP-OMP) 09/26/1998, 04/02/1999, 05/08/1999, 08/21/1999   HPV 9-valent 01/31/2020   HPV Quadrivalent 01/22/2011, 07/06/2016, 11/20/2019   Hepatitis A, Ped/Adol-2 Dose 11/20/2010, 07/06/2016   Hepatitis B, PED/ADOLESCENT 11/17/1998, 04/21/1999, 04/08/2000   IPV 11/22/1998, 04/02/1999, 04/08/2000, 09/05/2002   Influenza,inj,Quad PF,6+ Mos 01/20/2022   MMR 04/08/2000, 09/05/2002   MenQuadfi_Meningococcal Groups ACYW Conjugate 11/20/2010, 07/06/2016   PFIZER(Purple Top)SARS-COV-2 Vaccination 04/17/2020   Pneumococcal Conjugate PCV 7 04/02/1999, 05/08/1999, 08/21/1999   Tdap 11/14/2009, 03/17/2021     Objective: Vital Signs: BP 101/70 (BP Location: Left Arm, Patient Position: Sitting, Cuff Size: Large)   Pulse 97   Resp 14   Ht 5\' 5"  (1.651 m)   Wt 273 lb (123.8 kg)   LMP 05/09/2023   BMI 45.43 kg/m    Physical Exam Cardiovascular:     Rate and Rhythm: Normal rate and regular rhythm.  Pulmonary:     Effort: Pulmonary effort is normal.     Breath sounds: Normal breath sounds.  Skin:    General: Skin is warm and dry.     Comments: Rash on face, neck, left elbow flexural surface flat with mostly hyperpigmented changes except some central facial clearing    Neurological:     Mental Status: She is alert.  Psychiatric:        Mood and Affect: Mood  normal.     Shoulders full ROM no tenderness or swelling Elbows full ROM no tenderness or swelling Wrists full ROM no tenderness or swelling Fingers full ROM no tenderness or swelling No paraspinal tenderness to palpation over upper and lower back Hip normal internal and external rotation without pain Knees full ROM no tenderness or swelling Pain on palpation of foot, no obvious swelling  Investigation: No additional findings.  Imaging: No results found.  Recent Labs: Lab Results  Component Value Date   WBC 7.0 05/16/2023   HGB 12.2 05/16/2023   PLT 301 05/16/2023   NA 137 05/16/2023   K 4.4 05/16/2023   CL 102 05/16/2023   CO2 31 05/16/2023   GLUCOSE 113 (H) 05/16/2023   BUN 8 05/16/2023   CREATININE 0.74 05/16/2023   BILITOT 0.3 05/16/2023   ALKPHOS 90 03/24/2023   AST 14 05/16/2023   ALT 10 05/16/2023   PROT 7.6 05/16/2023   ALBUMIN 3.7 03/24/2023   CALCIUM 8.9 05/16/2023   GFRAA 153 07/30/2020   QFTBGOLDPLUS INDETERMINATE (A) 01/08/2022    Speciality Comments: No specialty comments available.  Procedures:  No procedures performed Allergies: Patient has no known allergies.   Assessment / Plan:     Visit Diagnoses: Polymyositis (HCC) - Plan: Sedimentation rate, CK, azaTHIOprine (IMURAN) 50 MG tablet, predniSONE (DELTASONE) 10 MG tablet No particular complaints concerning for active muscle inflammation.  Skin rashes look stable or partial improvement. -Checking sed rate and CK for disease activity monitoring -Continue azathioprine 150 mg daily -Continue rituximab infusions 2 doses 1000 mg IV each 6 months -Prednisone 10 mg daily, if inflammatory serology improving can try tapering down to 5 mg  High risk medication use - azathioprine 150 mg daily and rituximab 1000 g 2 doses every 6 months - Plan: CBC with Differential/Platelet, COMPLETE METABOLIC PANEL WITH GFR Checking CBC and CMP for medication monitoring on continued use of azathioprine.  Following  rituximab no infusion reaction.  No serious interval infections.  ILD (interstitial lung disease) (HCC) - Plan: azaTHIOprine (IMURAN) 50 MG tablet, predniSONE (DELTASONE) 10 MG tablet  Rash and other nonspecific skin eruption  Vitamin D  deficiency - Will also check serum vitamin D level which I suspect is low and replace this as needed. - Plan: VITAMIN D 25 Hydroxy (Vit-D Deficiency, Fractures) Last level was 10 in November. Patient has been inconsistently taking prescribed Vitamin D supplement. -Check Vitamin D level today. -If level remains low, consider prescribing another course of high-dose Vitamin D supplement.  Ankle pain Chronic issue related to previous surgery for tarsal coalition. Recent increase in pain, possibly related to cold weather or overuse. -Consider referral to orthopedics if pain continues or worsens.   Orders: Orders Placed This Encounter  Procedures   Sedimentation rate   CK   VITAMIN D 25 Hydroxy (Vit-D Deficiency, Fractures)   CBC with Differential/Platelet   COMPLETE METABOLIC PANEL WITH GFR   Meds ordered this encounter  Medications   azaTHIOprine (IMURAN) 50 MG tablet    Sig: Take 3 tablets (150 mg total) by mouth daily.    Dispense:  270 tablet    Refill:  0   predniSONE (DELTASONE) 10 MG tablet    Sig: Take 1 tablet (10 mg total) by mouth daily with breakfast.    Dispense:  90 tablet    Refill:  0     Follow-Up Instructions: Return in about 3 months (around 08/13/2023) for DM/ILD on RTX/AZA/GC f/u 3mos.   Fuller Plan, MD  Note - This record has been created using [employer].  Chart creation errors have been sought, but may not always  have been located. Such creation errors do not reflect on  the standard of medical care.

## 2023-05-05 ENCOUNTER — Other Ambulatory Visit: Payer: Self-pay | Admitting: *Deleted

## 2023-05-05 MED ORDER — AZATHIOPRINE 50 MG PO TABS: 150.0000 mg | ORAL_TABLET | Freq: Every day | ORAL | 0 refills | Status: DC

## 2023-05-05 NOTE — Telephone Encounter (Signed)
 Patient contacted the office to request refill on Imuran  to be sent to [facility]  Last Fill: 03/24/2023  Labs: 03/24/2023 Glucose 142, Calcium 8.8, Hgb 11.8, RDW 16.3, Neutro Abs 7.9  Next Visit: 05/16/2023  Last Visit: 02/09/2023  DX: Polymyositis   Current Dose per office note 02/09/2023: azathioprine  150 mg daily   Okay to refill Imuran ?

## 2023-05-16 ENCOUNTER — Encounter: Payer: Self-pay | Admitting: Internal Medicine

## 2023-05-16 ENCOUNTER — Ambulatory Visit: Payer: BC Managed Care – PPO | Attending: Internal Medicine | Admitting: Internal Medicine

## 2023-05-16 VITALS — BP 101/70 | HR 97 | Resp 14 | Ht 65.0 in | Wt 273.0 lb

## 2023-05-16 DIAGNOSIS — Z79899 Other long term (current) drug therapy: Secondary | ICD-10-CM | POA: Diagnosis not present

## 2023-05-16 DIAGNOSIS — Z7952 Long term (current) use of systemic steroids: Secondary | ICD-10-CM | POA: Diagnosis not present

## 2023-05-16 DIAGNOSIS — J849 Interstitial pulmonary disease, unspecified: Secondary | ICD-10-CM

## 2023-05-16 DIAGNOSIS — E559 Vitamin D deficiency, unspecified: Secondary | ICD-10-CM

## 2023-05-16 DIAGNOSIS — M3320 Polymyositis, organ involvement unspecified: Secondary | ICD-10-CM

## 2023-05-16 DIAGNOSIS — R21 Rash and other nonspecific skin eruption: Secondary | ICD-10-CM

## 2023-05-16 MED ORDER — PREDNISONE 10 MG PO TABS: 10.0000 mg | ORAL_TABLET | Freq: Every day | ORAL | 0 refills | Status: DC

## 2023-05-16 MED ORDER — AZATHIOPRINE 50 MG PO TABS: 150.0000 mg | ORAL_TABLET | Freq: Every day | ORAL | 0 refills | Status: DC

## 2023-05-17 LAB — CBC WITH DIFFERENTIAL/PLATELET
Absolute Lymphocytes: 742 {cells}/uL — ABNORMAL LOW (ref 850–3900)
Absolute Monocytes: 833 {cells}/uL (ref 200–950)
Basophils Absolute: 21 {cells}/uL (ref 0–200)
Basophils Relative: 0.3 %
Eosinophils Absolute: 119 {cells}/uL (ref 15–500)
Eosinophils Relative: 1.7 %
HCT: 37.7 % (ref 35.0–45.0)
Hemoglobin: 12.2 g/dL (ref 11.7–15.5)
MCH: 28.7 pg (ref 27.0–33.0)
MCHC: 32.4 g/dL (ref 32.0–36.0)
MCV: 88.7 fL (ref 80.0–100.0)
MPV: 11.4 fL (ref 7.5–12.5)
Monocytes Relative: 11.9 %
Neutro Abs: 5285 {cells}/uL (ref 1500–7800)
Neutrophils Relative %: 75.5 %
Platelets: 301 10*3/uL (ref 140–400)
RBC: 4.25 10*6/uL (ref 3.80–5.10)
RDW: 16 % — ABNORMAL HIGH (ref 11.0–15.0)
Total Lymphocyte: 10.6 %
WBC: 7 10*3/uL (ref 3.8–10.8)

## 2023-05-17 LAB — COMPLETE METABOLIC PANEL WITH GFR
AG Ratio: 0.9 (calc) — ABNORMAL LOW (ref 1.0–2.5)
ALT: 10 U/L (ref 6–29)
AST: 14 U/L (ref 10–30)
Albumin: 3.7 g/dL (ref 3.6–5.1)
Alkaline phosphatase (APISO): 96 U/L (ref 31–125)
BUN: 8 mg/dL (ref 7–25)
CO2: 31 mmol/L (ref 20–32)
Calcium: 8.9 mg/dL (ref 8.6–10.2)
Chloride: 102 mmol/L (ref 98–110)
Creat: 0.74 mg/dL (ref 0.50–0.96)
Globulin: 3.9 g/dL — ABNORMAL HIGH (ref 1.9–3.7)
Glucose, Bld: 113 mg/dL — ABNORMAL HIGH (ref 65–99)
Potassium: 4.4 mmol/L (ref 3.5–5.3)
Sodium: 137 mmol/L (ref 135–146)
Total Bilirubin: 0.3 mg/dL (ref 0.2–1.2)
Total Protein: 7.6 g/dL (ref 6.1–8.1)
eGFR: 116 mL/min/{1.73_m2} (ref >=60)

## 2023-05-17 LAB — SEDIMENTATION RATE: Sed Rate: 79 mm/h — ABNORMAL HIGH (ref 0–20)

## 2023-05-17 LAB — VITAMIN D 25 HYDROXY (VIT D DEFICIENCY, FRACTURES): Vit D, 25-Hydroxy: 35 ng/mL (ref 30–100)

## 2023-05-17 LAB — CK: Total CK: 181 U/L (ref 20–239)

## 2023-05-17 NOTE — Progress Notes (Signed)
Sedimentation rate is partially improved down to 79 from 92 although this is still elevated.  Blood count and kidney and liver function are fine.  CK level is now normal at 181 which is great.  Vitamin D level is normal now at 35 so I think she is okay to continue with just a once daily oral supplement.

## 2023-07-28 ENCOUNTER — Encounter: Payer: BC Managed Care – PPO | Admitting: Physician Assistant

## 2023-08-01 NOTE — Progress Notes (Deleted)
 Office Visit Note  Patient: Rachael Patton             Date of Birth: [birth year]           MRN: [id number]             PCP: Alexander Iba, PA Referring: Alexander Iba, PA Visit Date: 08/10/2023   Subjective:  No chief complaint on file.   History of Present Illness: Rachael Patton is a 23 y.o. 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     Previous HPI 05/16/2023 Rachael Patton is a 22 y.o. 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     She experiences persistent aching pain in her ankle, which worsens with activity and sometimes occurs without any apparent trigger. The pain is severe enough to impact her ability to walk. She has a history of tarsal coalition and underwent surgery in 2021, which involved shaving some bones. Despite the surgery, she continues to experience decreased range of motion in her ankle.   She is on rituximab  infusions, prednisone , and Imuran  for her condition. She also takes a multivitamin but has not consistently taken her prescribed weekly vitamin D  supplement. Her last vitamin D  level in November was 10, which is considered low.   Last month, she experienced an episode of disorientation and headache after missing her medication while traveling. She describes waking up feeling disoriented, unable to move her feet, and shaking when trying to drink water. She was treated with fluids in the emergency room, which alleviated her symptoms. She attributes the episode to possible dehydration.   No recent swelling, rashes, or significant respiratory symptoms, although she has experienced occasional coughing when outside, which she attributes to the air. No recent fevers or infections requiring antibiotics. She reports a cramp in her leg yesterday that persisted from midday  through the night, but denies any circulation problems such as fingers or toes turning blue or white.         Previous HPI 02/09/2023 Rachael Patton is a 23 y.o. 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  Last rituximab  dose was in May and the second dose of that round was stopped early due to infusion reaction with feeling of throat itching and tongue heaviness.  This resolved within 5 minutes after iv benadryl  and no problems afterwards. Her skin rashes remain active still with a lot of dryness and discoloration patches on the face mostly.  She has seen a partial improvement with topical medication from the dermatologist.  Still has some pain in her back usually just with prolonged standing or walking.  She experiences coughing when taking deep breaths and sometimes with exertion.  She had CT scan earlier this month concerning for some progression of lung fibrosis on imaging.   Previous HPI 10/13/2022 Rachael Patton is a 23 y.o. 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  She had an infusion reaction during the last rituximab  treatment part way through it developed some throat tingling and tightness sensation this resolved after administration of steroids antihistamine and pausing the infusion it was able to be  resumed same day and completed uneventfully.  Symptoms overall have been stable. Ongoing skin rashes thought consistent with eczema and dryness with contribution of long term steroid use at dermatology assessment. She had PFTs last month showed small improvement compared to study last year. Has not been feeling pain or weakness except past month after she fell while cleaning the floor landing on the back of her left thigh with some pain in that area since.     Previous HPI 07/13/22 Rachael Patton is a 23 y.o. female here for follow up for myositis with associated ILD on azathioprine  150 mg daily prednisone  5 mg daily on rituximab  infusion 1 g x 2 doses started in November.  Overall she feels well has not noticed any significant difference tapering the prednisone  from 20 mg down to 5 mg daily.  No weakness.  No coughing or shortness of breath.  Skin rashes remain a problem particularly on the face and neck and she has appointment scheduled with dermatology coming up this week.   Previous HPI 04/13/22 Rachael Patton is a 23 y.o. 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 She had no complications with the treatment. No change of dyspnea or cough symptoms. She had a visit to the ED with some left sided chest pain and workup including CT at that time looked fine. She has noticed some more facial rash with skin peeling and dark areas under her eyes.     Previous HPI 01/08/22 Rachael Patton is a 23 y.o. 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 Since increasing the steroid dose her facial rashes are improving. She is gaining some weight on this. No particular coughing or shortness of breath complaint currently. No weakness or trouble walking or stairs.   Previous HPI 11/06/2021  Rachael Patton is a 23 y.o. female here for follow up for polymyositis on azathioprine  150 mg daily and prednisone  5 mg daily.  We increased her azathioprine  dose after last visit due to elevated CK levels she had some generalized fatigue but no focal changes in muscle function at that time.  Pulmonology clinic follow-up concerning for deficits.  She thinks her fatigue or energy may be a little bit better but there is some increase in hyperpigmented facial rashes and some swelling around both eyes is new.  No visual changes or pain just notices the  swelling   Previous HPI 07/17/2021 Rachael Patton is a 23 y.o. female here for follow up for polymyositis on azathioprine  100 mg daily and prednisone  5 mg daily. She has developed a persistent nonproductive cough again since our last visit. Not associated with shortness of breath or chest pain. She has eye dryness and itching. Facial hyperpigmented rashes are increased the areas on her arms are about the same. Rashes not particularly painful or itchy. She denies any weakness or mobility problem denies any swallowing problems.   Previous HPI 04/17/21 Rachael Patton is a 23 y.o. female here for follow up for polymyositis on azathioprine  100 mg PO daily and prednisone  5 mg daily. She deliver her daughter about a month ago without major complications. She slipped her left leg during laboring and felt pain in the anterior hip that has gradually improved over time.   Previous HPI: 05/08/20 Rachael Patton is a 23 y.o. female with history of hypothyroidism, elevated prolactin here for  follow up of hospitalization last week with proximal muscle weakness found to have highly elevated CK and myoglobinuria. This was treated with IV fluids and started steroid treatment with improvement in CK from 11,796 to 4,047. She was discharged on prednisone  80mg  per day dose. Prior to this she was ill with COVID a month ago. She was having some type of GI illness in December not clear if this was related to COVID infection or separate process. Workup with labs on 1/6 and 1/7 showing transaminitis also positive Ab tests for COVID19 and Hepatitis A. Since the hospital visit she feels her symptoms are partially improved. She notices continued leg swelling and has some dyspnea with lying supine and on exertion. Skin rash on the face is slightly less warm and severe than before. She thinks strength is slightly better but not at her baseline at all.   No Rheumatology ROS completed.   PMFS History:  Patient Active Problem List    Diagnosis Date Noted   Vitamin D  deficiency 02/09/2023   Rash and other nonspecific skin eruption 10/13/2022   Screening for tuberculosis 11/20/2021   SS-A antibody positive 11/26/2020   Polymyositis (HCC) 07/18/2020   ILD (interstitial lung disease) (HCC) 06/25/2020   Myositis associated antibody positive 06/23/2020   High risk medication use 05/08/2020   Morbid obesity (HCC) 04/30/2020   Prolactin increased 04/30/2020   Hypothyroidism 04/30/2020   Prediabetes 04/30/2020   Iron  deficiency anemia 04/30/2020   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    Past Medical History:  Diagnosis Date   Anemia    Asthma    As a child   Myositis    Prediabetes    Rhabdomyolysis    Thyroid  disease    found at age 1    Family History  Problem Relation Age of Onset   Multiple sclerosis Mother    Diabetes Mother    Diabetes Maternal Grandmother    Diabetes Paternal Grandmother    Stroke Paternal Grandmother    Stroke Paternal Grandfather    Diabetes Paternal Grandfather    Multiple sclerosis Cousin    Lupus Cousin    Past Surgical History:  Procedure Laterality Date   FOOT SURGERY Left 06/2019   MUSCLE BIOPSY Right 05/14/2020   Procedure: RIGHT THIGH MUSCLE BIOPSY;  Surgeon: Lujean Sake, MD;  Location: [facility] OR;  Service: General;  Laterality: Right;  ROOM 3 STARTING AT 04:00PM FOR   Social History   Social History Narrative   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; forensic pyschology   Lives with boyfriend   No children   Immunization History  Administered Date(s) Administered   DTaP 09/26/1998, 04/02/1999, 05/08/1999, 04/08/2000, 09/05/2002   HIB (PRP-OMP) 09/26/1998, 04/02/1999, 05/08/1999, 08/21/1999   HPV 9-valent 01/31/2020   HPV Quadrivalent 01/22/2011, 07/06/2016, 11/20/2019   Hepatitis A, Ped/Adol-2 Dose 11/20/2010, 07/06/2016   Hepatitis B, PED/ADOLESCENT 11/17/1998, 04/21/1999, 04/08/2000   IPV 11/22/1998, 04/02/1999, 04/08/2000, 09/05/2002    Influenza,inj,Quad PF,6+ Mos 01/20/2022   MMR 04/08/2000, 09/05/2002   MenQuadfi_Meningococcal Groups ACYW Conjugate 11/20/2010, 07/06/2016   PFIZER(Purple Top)SARS-COV-2 Vaccination 04/17/2020   Pneumococcal Conjugate PCV 7 04/02/1999, 05/08/1999, 08/21/1999   Tdap 11/14/2009, 03/17/2021     Objective: Vital Signs: There were no vitals taken for this visit.   Physical Exam   Musculoskeletal Exam: ***  CDAI Exam: CDAI Score: -- Patient Global: --; Provider Global: -- Swollen: --; Tender: -- Joint Exam 08/10/2023   No joint exam has been documented for this visit   There is  currently no information documented on the homunculus. Go to the Rheumatology activity and complete the homunculus joint exam.  Investigation: No additional findings.  Imaging: No results found.  Recent Labs: Lab Results  Component Value Date   WBC 7.0 05/16/2023   HGB 12.2 05/16/2023   PLT 301 05/16/2023   NA 137 05/16/2023   K 4.4 05/16/2023   CL 102 05/16/2023   CO2 31 05/16/2023   GLUCOSE 113 (H) 05/16/2023   BUN 8 05/16/2023   CREATININE 0.74 05/16/2023   BILITOT 0.3 05/16/2023   ALKPHOS 90 03/24/2023   AST 14 05/16/2023   ALT 10 05/16/2023   PROT 7.6 05/16/2023   ALBUMIN 3.7 03/24/2023   CALCIUM 8.9 05/16/2023   GFRAA 153 07/30/2020   QFTBGOLDPLUS INDETERMINATE (A) 01/08/2022    Speciality Comments: No specialty comments available.  Procedures:  No procedures performed Allergies: Patient has no known allergies.   Assessment / Plan:     Visit Diagnoses: No diagnosis found.  ***  Orders: No orders of the defined types were placed in this encounter.  No orders of the defined types were placed in this encounter.    Follow-Up Instructions: No follow-ups on file.   Glena Landau, RT  Note - This record has been created using [employer].  Chart creation errors have been sought, but may not always  have been located. Such creation errors do not reflect on  the  standard of medical care.

## 2023-08-10 ENCOUNTER — Ambulatory Visit: Payer: BC Managed Care – PPO | Admitting: Internal Medicine

## 2023-08-10 DIAGNOSIS — J849 Interstitial pulmonary disease, unspecified: Secondary | ICD-10-CM

## 2023-08-10 DIAGNOSIS — Z7952 Long term (current) use of systemic steroids: Secondary | ICD-10-CM

## 2023-08-10 DIAGNOSIS — R21 Rash and other nonspecific skin eruption: Secondary | ICD-10-CM

## 2023-08-10 DIAGNOSIS — E559 Vitamin D deficiency, unspecified: Secondary | ICD-10-CM

## 2023-08-10 DIAGNOSIS — Z79899 Other long term (current) drug therapy: Secondary | ICD-10-CM

## 2023-08-10 DIAGNOSIS — M3320 Polymyositis, organ involvement unspecified: Secondary | ICD-10-CM

## 2023-09-07 NOTE — Progress Notes (Signed)
 Office Visit Note  Patient: Rachael Patton             Date of Birth: 2000-08-10           MRN: [id number]             PCP: Job Lukes, PA Referring: Job Lukes, PA Visit Date: 09/21/2023   Subjective:  Follow-up (Patient states her left knee feels like there is fluid in it. )    Discussed the use of AI scribe software for clinical note transcription with the patient, who gave verbal consent to proceed.  History of Present Illness   Rachael Patton is a 23 y.o. 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     She has been experiencing knee pain and swelling, primarily in the left knee, since the beginning of last month. The pain intensifies when her leg hangs off the bed or when standing for extended periods. The left knee appears swollen, as if it has fluid, while the right knee also causes discomfort but does not swell as much.  She is currently on prednisone , at a dose of 10 mg, which she ran out of while working away from home, potentially affecting her symptoms. She has previously used Tylenol  for pain relief, which was effective. She has a history of using non-steroidal anti-inflammatory drugs like ibuprofen  and Aleve before starting steroids.  No new rashes have been noted. She experiences breathing difficulties, such as coughing, when bending over, walking up steps, and lying on her side. There have been no significant changes in these symptoms recently.      Previous HPI 05/16/2023 Rachael Patton is a 23 y.o. 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     She experiences persistent aching pain in her ankle, which worsens with activity and sometimes occurs without any apparent trigger. The pain is severe enough to impact her ability to walk. She  has a history of tarsal coalition and underwent surgery in 2021, which involved shaving some bones. Despite the surgery, she continues to experience decreased range of motion in her ankle.   She is on rituximab  infusions, prednisone , and Imuran  for her condition. She also takes a multivitamin but has not consistently taken her prescribed weekly vitamin D  supplement. Her last vitamin D  level in November was 10, which is considered low.   Last month, she experienced an episode of disorientation and headache after missing her medication while traveling. She describes waking up feeling disoriented, unable to move her feet, and shaking when trying to drink water. She was treated with fluids in the emergency room, which alleviated her symptoms. She attributes the episode to possible dehydration.   No recent swelling, rashes, or significant respiratory symptoms, although she has experienced occasional coughing when outside, which she attributes to the air. No recent fevers or infections requiring antibiotics. She reports a cramp in her leg yesterday that persisted from midday through the night, but denies any circulation problems such as fingers or toes turning blue or white.         Previous HPI 02/09/2023 Rachael Patton is a 23 y.o. 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  Last rituximab   dose was in May and the second dose of that round was stopped early due to infusion reaction with feeling of throat itching and tongue heaviness.  This resolved within 5 minutes after iv benadryl  and no problems afterwards. Her skin rashes remain active still with a lot of dryness and discoloration patches on the face mostly.  She has seen a partial improvement with topical medication from the dermatologist.  Still has some pain in her back usually just with prolonged standing or walking.  She experiences coughing when taking  deep breaths and sometimes with exertion.  She had CT scan earlier this month concerning for some progression of lung fibrosis on imaging.   Previous HPI 10/13/2022 Rachael Patton is a 23 y.o. 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  She had an infusion reaction during the last rituximab  treatment part way through it developed some throat tingling and tightness sensation this resolved after administration of steroids antihistamine and pausing the infusion it was able to be resumed same day and completed uneventfully.  Symptoms overall have been stable. Ongoing skin rashes thought consistent with eczema and dryness with contribution of long term steroid use at dermatology assessment. She had PFTs last month showed small improvement compared to study last year. Has not been feeling pain or weakness except past month after she fell while cleaning the floor landing on the back of her left thigh with some pain in that area since.     Previous HPI 07/13/22 Rachael Patton is a 23 y.o. female here for follow up for myositis with associated ILD on azathioprine  150 mg daily prednisone  5 mg daily on rituximab  infusion 1 g x 2 doses started in November.  Overall she feels well has not noticed any significant difference tapering the prednisone  from 20 mg down to 5 mg daily.  No weakness.  No coughing or shortness of breath.  Skin rashes remain a problem particularly on the face and neck and she has appointment scheduled with dermatology coming up this week.   Previous HPI 04/13/22 Rachael Patton is a 23 y.o. 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 She had no complications with the treatment. No change of dyspnea or cough symptoms. She had a visit to the ED with some left sided chest pain and workup  including CT at that time looked fine. She has noticed some more facial rash with skin peeling and dark areas under her eyes.     Previous HPI 01/08/22 Rachael Patton is a 23 y.o. 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 Since increasing the steroid dose her facial rashes are improving. She is gaining some weight on this. No particular coughing or shortness of breath complaint currently. No weakness or trouble walking or stairs.   Previous HPI 11/06/2021  Rachael Patton is a 23 y.o. female here for follow up for polymyositis on azathioprine  150 mg daily and prednisone  5 mg daily.  We increased her azathioprine  dose after last visit due to elevated CK levels she had some generalized fatigue but no focal changes in muscle function at that time.  Pulmonology clinic follow-up concerning for deficits.  She thinks her fatigue or energy may be a little bit better but there is some  increase in hyperpigmented facial rashes and some swelling around both eyes is new.  No visual changes or pain just notices the swelling   Previous HPI 07/17/2021 Rachael Patton is a 23 y.o. female here for follow up for polymyositis on azathioprine  100 mg daily and prednisone  5 mg daily. She has developed a persistent nonproductive cough again since our last visit. Not associated with shortness of breath or chest pain. She has eye dryness and itching. Facial hyperpigmented rashes are increased the areas on her arms are about the same. Rashes not particularly painful or itchy. She denies any weakness or mobility problem denies any swallowing problems.   Previous HPI 04/17/21 Rachael Patton is a 23 y.o. female here for follow up for polymyositis on azathioprine  100 mg PO daily and prednisone  5 mg daily. She deliver her daughter about a month ago without major complications. She slipped her left leg during laboring and felt pain in the anterior hip that has gradually  improved over time.   Previous HPI: 05/08/20 Rachael Patton is a 23 y.o. female with history of hypothyroidism, elevated prolactin here for follow up of hospitalization last week with proximal muscle weakness found to have highly elevated CK and myoglobinuria. This was treated with IV fluids and started steroid treatment with improvement in CK from 11,796 to 4,047. She was discharged on prednisone  80mg  per day dose. Prior to this she was ill with COVID a month ago. She was having some type of GI illness in December not clear if this was related to COVID infection or separate process. Workup with labs on 1/6 and 1/7 showing transaminitis also positive Ab tests for COVID19 and Hepatitis A. Since the hospital visit she feels her symptoms are partially improved. She notices continued leg swelling and has some dyspnea with lying supine and on exertion. Skin rash on the face is slightly less warm and severe than before. She thinks strength is slightly better but not at her baseline at all.   Review of Systems  Constitutional:  Negative for fatigue.  HENT:  Positive for mouth sores. Negative for mouth dryness.   Eyes:  Negative for dryness.  Respiratory:  Negative for shortness of breath.   Cardiovascular:  Negative for chest pain and palpitations.  Gastrointestinal:  Negative for blood in stool, constipation and diarrhea.  Endocrine: Negative for increased urination.  Genitourinary:  Negative for involuntary urination.  Musculoskeletal:  Positive for joint pain, joint pain and joint swelling. Negative for gait problem, myalgias, muscle weakness, morning stiffness, muscle tenderness and myalgias.  Skin:  Positive for sensitivity to sunlight. Negative for color change, rash and hair loss.  Allergic/Immunologic: Negative for susceptible to infections.  Neurological:  Negative for dizziness and headaches.  Hematological:  Negative for swollen glands.  Psychiatric/Behavioral:  Negative for depressed mood  and sleep disturbance. The patient is not nervous/anxious.     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    Past Medical History:  Diagnosis Date  Anemia    Asthma    As a child   Myositis    Prediabetes    Rhabdomyolysis    Thyroid  disease    found at age 29    Family History  Problem Relation Age of Onset   Multiple sclerosis Mother    Diabetes Mother    Diabetes Maternal Grandmother    Diabetes Paternal Grandmother    Stroke Paternal Grandmother    Stroke Paternal Grandfather    Diabetes Paternal Grandfather    Multiple sclerosis Cousin    Lupus Cousin    Past Surgical History:  Procedure Laterality Date   FOOT SURGERY Left 06/2019   MUSCLE BIOPSY Right 05/14/2020   Procedure: RIGHT THIGH MUSCLE BIOPSY;  Surgeon: Dasie Leonor CROME, MD;  Location: [facility] OR;  Service: General;  Laterality: Right;  ROOM 3 STARTING AT 04:00PM FOR   Social History   Social History Narrative   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; forensic pyschology   Lives with boyfriend   No children   Immunization History  Administered Date(s) Administered   DTaP 09/26/1998, 04/02/1999, 05/08/1999, 04/08/2000, 09/05/2002   HIB (PRP-OMP) 09/26/1998, 04/02/1999, 05/08/1999, 08/21/1999   HPV 9-valent 01/31/2020   HPV Quadrivalent 01/22/2011, 07/06/2016, 11/20/2019   Hepatitis A, Ped/Adol-2 Dose 11/20/2010, 07/06/2016   Hepatitis B,  PED/ADOLESCENT 11/17/1998, 04/21/1999, 04/08/2000   IPV 11/22/1998, 04/02/1999, 04/08/2000, 09/05/2002   Influenza,inj,Quad PF,6+ Mos 01/20/2022   MMR 04/08/2000, 09/05/2002   MenQuadfi_Meningococcal Groups ACYW Conjugate 11/20/2010, 07/06/2016   PFIZER(Purple Top)SARS-COV-2 Vaccination 04/17/2020   Pneumococcal Conjugate PCV 7 04/02/1999, 05/08/1999, 08/21/1999   Tdap 11/14/2009, 03/17/2021     Objective: Vital Signs: BP 99/66 (BP Location: Left Arm, Patient Position: Sitting, Cuff Size: Large)   Pulse 76   Resp 14   Ht 5' 5 (1.651 m)   Wt 277 lb (125.6 kg)   LMP 09/07/2023   BMI 46.10 kg/m    Physical Exam Constitutional:      Appearance: She is obese.   Eyes:     Conjunctiva/sclera: Conjunctivae normal.    Cardiovascular:     Rate and Rhythm: Normal rate and regular rhythm.  Pulmonary:     Effort: Pulmonary effort is normal.     Breath sounds: Normal breath sounds.  Lymphadenopathy:     Cervical: No cervical adenopathy.   Skin:    General: Skin is warm and dry.     Comments: Flat irregular hyper and hypopigmented areas on the face but no overlying scaling or papules No digital pitting   Neurological:     Mental Status: She is alert.   Psychiatric:        Mood and Affect: Mood normal.      Musculoskeletal Exam:  Shoulders full ROM no tenderness or swelling Elbows full ROM no tenderness or swelling Wrists full ROM no tenderness or swelling Fingers full ROM no tenderness or swelling Knees full ROM, trace left knee effusion, tenderness to pressure no warmth or erythema Ankles full ROM no tenderness or swelling   Investigation: No additional findings.  Imaging: No results found.  Recent Labs: Lab Results  Component Value Date   WBC 9.2 09/21/2023   HGB 12.2 09/21/2023   PLT 343 09/21/2023   NA 135 09/21/2023   K 4.8 09/21/2023   CL 100 09/21/2023   CO2 26 09/21/2023   GLUCOSE 102 (H) 09/21/2023   BUN 9 09/21/2023   CREATININE 0.79 09/21/2023    BILITOT 0.5 09/21/2023   ALKPHOS 90 03/24/2023   AST  16 09/21/2023   ALT 11 09/21/2023   PROT 8.0 09/21/2023   ALBUMIN 3.7 03/24/2023   CALCIUM 9.0 09/21/2023   GFRAA 153 07/30/2020   QFTBGOLDPLUS INDETERMINATE (A) 01/08/2022    Speciality Comments: No specialty comments available.  Procedures:  No procedures performed Allergies: Patient has no known allergies.   Assessment / Plan:     Visit Diagnoses: Polymyositis (HCC)  ILD (interstitial lung disease) (HCC)  - Plan: Sedimentation rate, CK, predniSONE  (DELTASONE ) 5 MG tablet, azaTHIOprine  (IMURAN ) 50 MG tablet Myositis with previous normal CK levels. Prednisone  used for myositis ILD management. Potential joint inflammation with prednisone  reduction, alternatives available.  Definitely think she is getting multiple side effects related with the prolonged steroids and really need to get her down off of this dose.  Given the fact of joint pain and effusion after missing some steroid doses will need to monitor if symptoms worsen with the tapering may need to start alternate steroid sparing DMARD.  She is now at about 1 year on the rituximab  treatment so we will continue on the current dosing. - Continue rituximab  infusion 1 g x 2 doses every 6 months - Recheck CK and sed rate levels today. - Continue azathioprine  150 mg p.o. daily - Decrease prednisone  5 mg daily  High risk medication use - azathioprine  150 mg daily, rituximab  infusions 2 doses 1000 mg IV each 6 months - Plan: CBC with Differential/Platelet, Comprehensive metabolic panel with GFR Appears to be tolerating medications okay overall.  No serious interval infections. - Checking CBC and CMP for medication monitoring continue long-term use of azathioprine  as well as rituximab  and prednisone   Vitamin D  deficiency - If level remains low, consider prescribing another course of high-dose Vitamin D  supplement. - Plan: Cholecalciferol (VITAMIN D ) 50 MCG (2000 UT) CAPS  Knee pain  and swelling Left knee pain and swelling, right knee pain without swelling. No joint effusion on ultrasound. Pain worsens with standing or leg hanging. Possible steroid-induced joint inflammation. - Recheck labs today. - Consider non-steroidal anti-inflammatory drugs if joint pain increases on low dose/off prednisong.   Orders: Orders Placed This Encounter  Procedures   Sedimentation rate   CK   CBC with Differential/Platelet   Comprehensive metabolic panel with GFR   Meds ordered this encounter  Medications   predniSONE  (DELTASONE ) 5 MG tablet    Sig: Take 1 tablet (5 mg total) by mouth daily with breakfast.    Dispense:  90 tablet    Refill:  0   azaTHIOprine  (IMURAN ) 50 MG tablet    Sig: Take 3 tablets (150 mg total) by mouth daily.    Dispense:  270 tablet    Refill:  0   Cholecalciferol (VITAMIN D ) 50 MCG (2000 UT) CAPS    Sig: Take 1 capsule (2,000 Units total) by mouth daily.     Follow-Up Instructions: Return in about 3 months (around 12/22/2023) for DM/ILD on RTX/AZA/GC f/u 3mos.   Lonni LELON Ester, MD  Note - This record has been created using [employer].  Chart creation errors have been sought, but may not always  have been located. Such creation errors do not reflect on  the standard of medical care.

## 2023-09-21 ENCOUNTER — Other Ambulatory Visit: Payer: Self-pay | Admitting: Internal Medicine

## 2023-09-21 ENCOUNTER — Encounter: Payer: Self-pay | Admitting: Internal Medicine

## 2023-09-21 ENCOUNTER — Ambulatory Visit: Attending: Internal Medicine | Admitting: Internal Medicine

## 2023-09-21 VITALS — BP 99/66 | HR 76 | Resp 14 | Ht 65.0 in | Wt 277.0 lb

## 2023-09-21 DIAGNOSIS — Z79899 Other long term (current) drug therapy: Secondary | ICD-10-CM

## 2023-09-21 DIAGNOSIS — E559 Vitamin D deficiency, unspecified: Secondary | ICD-10-CM

## 2023-09-21 DIAGNOSIS — Z7952 Long term (current) use of systemic steroids: Secondary | ICD-10-CM

## 2023-09-21 DIAGNOSIS — M7989 Other specified soft tissue disorders: Secondary | ICD-10-CM

## 2023-09-21 DIAGNOSIS — M3320 Polymyositis, organ involvement unspecified: Secondary | ICD-10-CM

## 2023-09-21 DIAGNOSIS — R21 Rash and other nonspecific skin eruption: Secondary | ICD-10-CM

## 2023-09-21 DIAGNOSIS — J849 Interstitial pulmonary disease, unspecified: Secondary | ICD-10-CM | POA: Diagnosis not present

## 2023-09-21 MED ORDER — PREDNISONE 5 MG PO TABS
5.0000 mg | ORAL_TABLET | Freq: Every day | ORAL | 0 refills | Status: DC
Start: 2023-09-21 — End: 2023-12-26

## 2023-09-21 MED ORDER — VITAMIN D 50 MCG (2000 UT) PO CAPS: 2000.0000 [IU] | ORAL_CAPSULE | Freq: Every day | ORAL | Status: AC

## 2023-09-21 MED ORDER — AZATHIOPRINE 50 MG PO TABS: 150.0000 mg | ORAL_TABLET | Freq: Every day | ORAL | 0 refills | Status: DC

## 2023-09-22 ENCOUNTER — Other Ambulatory Visit: Payer: Self-pay | Admitting: Internal Medicine

## 2023-09-22 DIAGNOSIS — M3320 Polymyositis, organ involvement unspecified: Secondary | ICD-10-CM

## 2023-09-22 DIAGNOSIS — J849 Interstitial pulmonary disease, unspecified: Secondary | ICD-10-CM

## 2023-09-22 LAB — CBC WITH DIFFERENTIAL/PLATELET
Absolute Lymphocytes: 690 {cells}/uL — ABNORMAL LOW (ref 850–3900)
Absolute Monocytes: 1067 {cells}/uL — ABNORMAL HIGH (ref 200–950)
Basophils Absolute: 37 {cells}/uL (ref 0–200)
Basophils Relative: 0.4 %
Eosinophils Absolute: 120 {cells}/uL (ref 15–500)
Eosinophils Relative: 1.3 %
HCT: 38.7 % (ref 35.0–45.0)
Hemoglobin: 12.2 g/dL (ref 11.7–15.5)
MCH: 28.5 pg (ref 27.0–33.0)
MCHC: 31.5 g/dL — ABNORMAL LOW (ref 32.0–36.0)
MCV: 90.4 fL (ref 80.0–100.0)
MPV: 11.6 fL (ref 7.5–12.5)
Monocytes Relative: 11.6 %
Neutro Abs: 7286 {cells}/uL (ref 1500–7800)
Neutrophils Relative %: 79.2 %
Platelets: 343 10*3/uL (ref 140–400)
RBC: 4.28 10*6/uL (ref 3.80–5.10)
RDW: 14.1 % (ref 11.0–15.0)
Total Lymphocyte: 7.5 %
WBC: 9.2 10*3/uL (ref 3.8–10.8)

## 2023-09-22 LAB — COMPREHENSIVE METABOLIC PANEL WITH GFR
AG Ratio: 1 (calc) (ref 1.0–2.5)
ALT: 11 U/L (ref 6–29)
AST: 16 U/L (ref 10–30)
Albumin: 3.9 g/dL (ref 3.6–5.1)
Alkaline phosphatase (APISO): 88 U/L (ref 31–125)
BUN: 9 mg/dL (ref 7–25)
CO2: 26 mmol/L (ref 20–32)
Calcium: 9 mg/dL (ref 8.6–10.2)
Chloride: 100 mmol/L (ref 98–110)
Creat: 0.79 mg/dL (ref 0.50–0.96)
Globulin: 4.1 g/dL — ABNORMAL HIGH (ref 1.9–3.7)
Glucose, Bld: 102 mg/dL — ABNORMAL HIGH (ref 65–99)
Potassium: 4.8 mmol/L (ref 3.5–5.3)
Sodium: 135 mmol/L (ref 135–146)
Total Bilirubin: 0.5 mg/dL (ref 0.2–1.2)
Total Protein: 8 g/dL (ref 6.1–8.1)
eGFR: 106 mL/min/{1.73_m2} (ref >=60)

## 2023-09-22 LAB — CK: Total CK: 218 U/L (ref 20–239)

## 2023-09-22 LAB — SEDIMENTATION RATE: Sed Rate: 62 mm/h — ABNORMAL HIGH (ref 0–20)

## 2023-12-01 ENCOUNTER — Encounter (HOSPITAL_BASED_OUTPATIENT_CLINIC_OR_DEPARTMENT_OTHER): Payer: Self-pay | Admitting: Pulmonary Disease

## 2023-12-01 ENCOUNTER — Ambulatory Visit (HOSPITAL_BASED_OUTPATIENT_CLINIC_OR_DEPARTMENT_OTHER): Admitting: Pulmonary Disease

## 2023-12-01 VITALS — BP 103/71 | HR 92 | Ht 65.0 in | Wt 280.0 lb

## 2023-12-01 DIAGNOSIS — R053 Chronic cough: Secondary | ICD-10-CM | POA: Diagnosis not present

## 2023-12-01 DIAGNOSIS — M3321 Polymyositis with respiratory involvement: Secondary | ICD-10-CM

## 2023-12-01 DIAGNOSIS — J849 Interstitial pulmonary disease, unspecified: Secondary | ICD-10-CM

## 2023-12-01 MED ORDER — BENZONATATE 200 MG PO CAPS: 200.0000 mg | ORAL_CAPSULE | Freq: Three times a day (TID) | ORAL | 1 refills | Status: AC | PRN

## 2023-12-01 NOTE — Progress Notes (Signed)
 Subjective:    Patient ID: Rachael Patton, female    DOB: 09/30/2000, 23 y.o.   MRN: [id number]     23 yo never smoker with polymyositis and ILD/ NSIP SSA +   She was hospitalized 04/2020 with bilateral upper and lower extremity proximal muscle weakness for 3 months, elevated CK myoglobinuria.  She was treated with IV fluids and started on prednisone  80 mg/day.  CK improved from [phone].  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  Anti-Jo antibody was negative Rituxan  was started 01/2022   PMH -Covid infection January 2022  rheumatology consultation 01/2023, labs showed increase CK9 1 5 and increased ESR 92, prednisone  was increased to 10 mg daily. She remains on Rituxan  and Imuran  150 mg     Discussed the use of AI scribe software for clinical note transcription with the patient, who gave verbal consent to proceed.  History of Present Illness    Discussed the use of AI scribe software for clinical note transcription with the patient, who gave verbal consent to proceed.  History of Present Illness   Rachael Patton is a 23 year old female with polymyositis and interstitial lung disease who presents with a chronic cough.  She experiences an increase in coughing, which sometimes leads to headaches. The cough is mostly dry, but in the mornings, she feels chest congestion. Choking sensations occur when stepping outside, possibly due to allergies. The cough does not significantly affect her breathing, which she describes as good. Mucinex provides temporary relief during the day, but she has not tried the night version. The cough calms when she lies down and does not disturb her sleep. She has not been using gabapentin  recently. Current medications include Imuran , prednisone   reduced to 5 mg, and Rituxan  administered approximately every six months as needed. She feels cold at times and considers the possibility of a cold or allergies.      Reviewed rheum 08/2023 >>pred decreased to 5 mg, rituxan  q 6 mtonhs  Significant tests/ events reviewed   01/2023 HRCT chest >> Progressive fibrotic ILD, some honeycombing, probable UIP 06/2021 HRCT Peribronchovascular ground-glass opacities which are most prominent in the mid and lower lungs with associated small cysts     08/2022 PFTs >> moderate to severe restriction, FVC 48% DLCO 11.2/48%   PFTs 06/2021 moderate to severe intraparenchymal restriction, ratio 75, FEV1 42%, 14% bronchodilator response, FVC 49%, TLC 49%, DLCO 10.99/46%    Review of Systems  neg for any significant sore throat, dysphagia, itching, sneezing, nasal congestion or excess/ purulent secretions, fever, chills, sweats, unintended wt loss, pleuritic or exertional cp, hempoptysis, orthopnea pnd or change in chronic leg swelling. Also denies presyncope, palpitations, heartburn, abdominal pain, nausea, vomiting, diarrhea or change in bowel or urinary habits, dysuria,hematuria, rash, arthralgias, visual complaints, headache, numbness weakness or ataxia.      Objective:   Physical Exam  Gen. 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 Current management includes Imuran ,  prednisone  reduced to 5 mg, and Rituxan  as needed. Reduction in prednisone  is aimed at minimizing long-term side effects. - Order chest CT scan in November to assess fibrosis and inflammation - Consider earlier chest CT scan if symptoms persist or worsen - Continue Imuran  and prednisone  at current doses - Administer Rituxan  as needed based on disease activity  Chronic cough Chronic cough, primarily dry, with sensation of chest fullness in the morning. Coughing exacerbated by environmental factors, possibly related to allergies. Cough does not significantly disturb sleep. Considering inflammation in the lungs as a contributing factor. - Recommend Delsym as an over-the-counter cough suppressant - Prescribe benzonatate  for trial

## 2023-12-01 NOTE — Patient Instructions (Addendum)
 Delsym OTC 5 ml bid as needed  X Rx for benzonatate  thrice dily as needed for cough  X HRCT chest in Nov    VISIT SUMMARY: Rachael Patton, a 23 year old female with polymyositis and interstitial lung disease, visited today due to a chronic cough. She experiences an increase in coughing, sometimes leading to headaches, with chest congestion in the mornings. The cough is mostly dry and worsens when she steps outside, possibly due to allergies. Mucinex provides temporary relief, and the cough calms when she lies down. She has not been using gabapentin  recently. Current medications include Imuran , prednisone  reduced to 5 mg, and Rituxan  as needed.  YOUR PLAN: -POLYMYOSITIS-ASSOCIATED INTERSTITIAL LUNG DISEASE: Polymyositis-associated interstitial lung disease is a condition where inflammation in the muscles is associated with lung disease. We will continue your current medications: Imuran , prednisone  at 5 mg, and Rituxan  as needed. A chest CT scan is scheduled for November to assess fibrosis and inflammation, but we may consider an earlier scan if your symptoms persist or worsen.  -CHRONIC COUGH: Chronic cough is a persistent cough that can be caused by various factors, including inflammation and allergies. We recommend trying Delsym, an over-the-counter cough suppressant, and have prescribed a cough medication (cough pulse) for you to try.  INSTRUCTIONS: Please continue taking your current medications as prescribed. We have scheduled a chest CT scan for November to assess your lung condition, but if your symptoms persist or worsen, we may need to do the scan earlier. Try using Delsym for your cough and take the prescribed cough medication as directed. Follow up with us  if there are any changes in your symptoms or if you have any concerns.                      Contains text generated by [employer].                                 Contains text generated  by [employer].

## 2023-12-08 NOTE — Progress Notes (Deleted)
 Office Visit Note  Patient: Rachael Patton             Date of Birth: 06-07-2000           MRN: [id number]             PCP: Job Lukes, PA Referring: Job Lukes, PA Visit Date: 12/21/2023   Subjective:  No chief complaint on file.   History of Present Illness: Rachael Patton is a 23 y.o. 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      Previous HPI 09/21/2023 Rachael Patton is a 23 y.o. 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      She has been experiencing knee pain and swelling, primarily in the left knee, since the beginning of last month. The pain intensifies when her leg hangs off the bed or when standing for extended periods. The left knee appears swollen, as if it has fluid, while the right knee also causes discomfort but does not swell as much.   She is currently on prednisone , at a dose of 10 mg, which she ran out of while working away from home, potentially affecting her symptoms. She has previously used Tylenol  for pain relief, which was effective. She has a history of using non-steroidal anti-inflammatory drugs like ibuprofen  and Aleve before starting steroids.   No new rashes have been noted. She experiences breathing difficulties, such as coughing, when bending over, walking up steps, and lying on her side. There have been no significant changes in these symptoms recently.       Previous HPI 05/16/2023 Rachael Patton is a 23 y.o. 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     She experiences persistent aching pain in her ankle, which worsens with activity and sometimes occurs without any apparent trigger. The pain  is severe enough to impact her ability to walk. She has a history of tarsal coalition and underwent surgery in 2021, which involved shaving some bones. Despite the surgery, she continues to experience decreased range of motion in her ankle.   She is on rituximab  infusions, prednisone , and Imuran  for her condition. She also takes a multivitamin but has not consistently taken her prescribed weekly vitamin D  supplement. Her last vitamin D  level in November was 10, which is considered low.   Last month, she experienced an episode of disorientation and headache after missing her medication while traveling. She describes waking up feeling disoriented, unable to move her feet, and shaking when trying to drink water. She was treated with fluids in the emergency room, which alleviated her symptoms. She attributes the episode to possible dehydration.   No recent swelling, rashes, or significant respiratory symptoms, although she has experienced occasional coughing when outside, which she attributes to the air. No recent fevers or infections requiring antibiotics. She reports a cramp in her leg yesterday that persisted from midday through the night, but denies any circulation problems such as fingers or toes turning blue or white.         Previous HPI 02/09/2023 Rachael Patton is a 23 y.o. female here for follow up for myositis on azathioprine  and 150 mg daily prednisone   5 mg daily and rituximab  1000mg  2 doses every 6 months.  Last rituximab  dose was in May and the second dose of that round was stopped early due to infusion reaction with feeling of throat itching and tongue heaviness.  This resolved within 5 minutes after iv benadryl  and no problems afterwards. Her skin rashes remain active still with a lot of dryness and discoloration patches on the face mostly.  She has seen a partial improvement with topical medication from the dermatologist.  Still has some pain in her back usually just with prolonged standing  or walking.  She experiences coughing when taking deep breaths and sometimes with exertion.  She had CT scan earlier this month concerning for some progression of lung fibrosis on imaging.   Previous HPI 10/13/2022 Rachael Patton is a 23 y.o. 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  She had an infusion reaction during the last rituximab  treatment part way through it developed some throat tingling and tightness sensation this resolved after administration of steroids antihistamine and pausing the infusion it was able to be resumed same day and completed uneventfully.  Symptoms overall have been stable. Ongoing skin rashes thought consistent with eczema and dryness with contribution of long term steroid use at dermatology assessment. She had PFTs last month showed small improvement compared to study last year. Has not been feeling pain or weakness except past month after she fell while cleaning the floor landing on the back of her left thigh with some pain in that area since.     Previous HPI 07/13/22 Rachael Patton is a 23 y.o. female here for follow up for myositis with associated ILD on azathioprine  150 mg daily prednisone  5 mg daily on rituximab  infusion 1 g x 2 doses started in November.  Overall she feels well has not noticed any significant difference tapering the prednisone  from 20 mg down to 5 mg daily.  No weakness.  No coughing or shortness of breath.  Skin rashes remain a problem particularly on the face and neck and she has appointment scheduled with dermatology coming up this week.   Previous HPI 04/13/22 Rachael Patton is a 23 y.o. 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 She had no complications with the treatment. No change of dyspnea or cough symptoms. She had a visit to  the ED with some left sided chest pain and workup including CT at that time looked fine. She has noticed some more facial rash with skin peeling and dark areas under her eyes.     Previous HPI 01/08/22 Rachael Patton is a 23 y.o. 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 Since increasing the steroid dose her facial rashes are improving. She is gaining some weight on this. No particular coughing or shortness of breath complaint currently. No weakness or trouble walking or stairs.   Previous HPI 11/06/2021  Rachael Patton is a 23 y.o. female here for follow up for polymyositis on azathioprine  150 mg daily and prednisone  5 mg daily.  We increased her azathioprine  dose after last visit due to elevated CK levels she had some generalized fatigue but no focal changes in muscle function at that time.  Pulmonology clinic follow-up concerning for deficits.  She thinks  her fatigue or energy may be a little bit better but there is some increase in hyperpigmented facial rashes and some swelling around both eyes is new.  No visual changes or pain just notices the swelling   Previous HPI 07/17/2021 Rachael Patton is a 23 y.o. female here for follow up for polymyositis on azathioprine  100 mg daily and prednisone  5 mg daily. She has developed a persistent nonproductive cough again since our last visit. Not associated with shortness of breath or chest pain. She has eye dryness and itching. Facial hyperpigmented rashes are increased the areas on her arms are about the same. Rashes not particularly painful or itchy. She denies any weakness or mobility problem denies any swallowing problems.   Previous HPI 04/17/21 Rachael Patton is a 23 y.o. female here for follow up for polymyositis on azathioprine  100 mg PO daily and prednisone  5 mg daily. She deliver her daughter about a month ago without major complications. She slipped her left leg during laboring and  felt pain in the anterior hip that has gradually improved over time.   Previous HPI: 05/08/20 Rachael Patton is a 23 y.o. female with history of hypothyroidism, elevated prolactin here for follow up of hospitalization last week with proximal muscle weakness found to have highly elevated CK and myoglobinuria. This was treated with IV fluids and started steroid treatment with improvement in CK from 11,796 to 4,047. She was discharged on prednisone  80mg  per day dose. Prior to this she was ill with COVID a month ago. She was having some type of GI illness in December not clear if this was related to COVID infection or separate process. Workup with labs on 1/6 and 1/7 showing transaminitis also positive Ab tests for COVID19 and Hepatitis A. Since the hospital visit she feels her symptoms are partially improved. She notices continued leg swelling and has some dyspnea with lying supine and on exertion. Skin rash on the face is slightly less warm and severe than before. She thinks strength is slightly better but not at her baseline at all.   No Rheumatology ROS completed.   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    Past Medical History:  Diagnosis Date   Anemia    Asthma    As a child   Myositis    Prediabetes    Rhabdomyolysis    Thyroid  disease    found at age 52    Family History  Problem Relation Age of Onset   Multiple  sclerosis Mother    Diabetes Mother    Diabetes Maternal Grandmother    Diabetes Paternal Grandmother    Stroke Paternal Grandmother    Stroke Paternal Grandfather    Diabetes Paternal Grandfather    Multiple sclerosis Cousin    Lupus Cousin    Past Surgical History:  Procedure Laterality Date   FOOT SURGERY Left 06/2019   MUSCLE BIOPSY Right 05/14/2020   Procedure: RIGHT THIGH MUSCLE BIOPSY;  Surgeon: Dasie Best  L, MD;  Location: [facility] OR;  Service: General;  Laterality: Right;  ROOM 3 STARTING AT 04:00PM FOR   Social History   Social History Narrative   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; forensic pyschology   Lives with boyfriend   No children   Immunization History  Administered Date(s) Administered   DTaP 09/26/1998, 04/02/1999, 05/08/1999, 04/08/2000, 09/05/2002   HIB (PRP-OMP) 09/26/1998, 04/02/1999, 05/08/1999, 08/21/1999   HPV 9-valent 01/31/2020   HPV Quadrivalent 01/22/2011, 07/06/2016, 11/20/2019   Hepatitis A, Ped/Adol-2 Dose 11/20/2010, 07/06/2016   Hepatitis B, PED/ADOLESCENT 11/17/1998, 04/21/1999, 04/08/2000   IPV 11/22/1998, 04/02/1999, 04/08/2000, 09/05/2002   Influenza,inj,Quad PF,6+ Mos 01/20/2022   MMR 04/08/2000, 09/05/2002   MenQuadfi_Meningococcal Groups ACYW Conjugate 11/20/2010, 07/06/2016   PFIZER(Purple Top)SARS-COV-2 Vaccination 04/17/2020   Pneumococcal Conjugate PCV 7 04/02/1999, 05/08/1999, 08/21/1999   Tdap 11/14/2009, 03/17/2021     Objective: Vital Signs: There were no vitals taken for this visit.   Physical Exam   Musculoskeletal Exam: ***  CDAI Exam: CDAI Score: -- Patient Global: --; Provider Global: -- Swollen: --; Tender: -- Joint Exam 12/21/2023   No joint exam has been documented for this visit   There is currently no information documented on the homunculus. Go to the Rheumatology activity and complete the homunculus joint exam.  Investigation: No additional findings.  Imaging: No results  found.  Recent Labs: Lab Results  Component Value Date   WBC 9.2 09/21/2023   HGB 12.2 09/21/2023   PLT 343 09/21/2023   NA 135 09/21/2023   K 4.8 09/21/2023   CL 100 09/21/2023   CO2 26 09/21/2023   GLUCOSE 102 (H) 09/21/2023   BUN 9 09/21/2023   CREATININE 0.79 09/21/2023   BILITOT 0.5 09/21/2023   ALKPHOS 90 03/24/2023   AST 16 09/21/2023   ALT 11 09/21/2023   PROT 8.0 09/21/2023   ALBUMIN 3.7 03/24/2023   CALCIUM 9.0 09/21/2023   GFRAA 153 07/30/2020   QFTBGOLDPLUS INDETERMINATE (A) 01/08/2022    Speciality Comments: No specialty comments available.  Procedures:  No procedures performed Allergies: Patient has no known allergies.   Assessment / Plan:     Visit Diagnoses: No diagnosis found.  ***  Orders: No orders of the defined types were placed in this encounter.  No orders of the defined types were placed in this encounter.    Follow-Up Instructions: No follow-ups on file.   Kem Parcher M Kalese Ensz, CMA  Note - This record has been created using Animal nutritionist.  Chart creation errors have been sought, but may not always  have been located. Such creation errors do not reflect on  the standard of medical care.

## 2023-12-21 ENCOUNTER — Ambulatory Visit: Admitting: Internal Medicine

## 2023-12-21 DIAGNOSIS — J849 Interstitial pulmonary disease, unspecified: Secondary | ICD-10-CM

## 2023-12-21 DIAGNOSIS — Z7952 Long term (current) use of systemic steroids: Secondary | ICD-10-CM

## 2023-12-21 DIAGNOSIS — Z79899 Other long term (current) drug therapy: Secondary | ICD-10-CM

## 2023-12-21 DIAGNOSIS — E559 Vitamin D deficiency, unspecified: Secondary | ICD-10-CM

## 2023-12-21 DIAGNOSIS — M3320 Polymyositis, organ involvement unspecified: Secondary | ICD-10-CM

## 2023-12-22 ENCOUNTER — Other Ambulatory Visit: Payer: Self-pay | Admitting: Internal Medicine

## 2023-12-22 DIAGNOSIS — J849 Interstitial pulmonary disease, unspecified: Secondary | ICD-10-CM

## 2023-12-22 DIAGNOSIS — M3320 Polymyositis, organ involvement unspecified: Secondary | ICD-10-CM

## 2023-12-22 NOTE — Telephone Encounter (Signed)
 Last Fill: 09/21/2023   Labs: 09/21/2023 MCHC 31.5 Absolute Lymphocytes 690 Absolute Monocytes 1,067 Glucose 102 Globulin 4.1  Next Visit: 01/11/2024  Last Visit: 09/21/2023  DX: Polymyositis   Current Dose per office note 09/21/2023: azathioprine  150 mg daily, prednisone  5 mg daily   Okay to refill Imuran  and prednisone ?  Contacted patient to update labs, patient would like to get those done at her upcoming appointment.

## 2023-12-28 ENCOUNTER — Other Ambulatory Visit: Payer: Self-pay | Admitting: Pharmacist

## 2023-12-28 NOTE — Progress Notes (Signed)
 Office Visit Note  Patient: Rachael Patton             Date of Birth: 2001-02-09           MRN: [id number]             PCP: Job Lukes, PA Referring: Job Lukes, PA Visit Date: 01/11/2024   Subjective:  Joint Swelling (Left leg/foot tingling and swelling )   Discussed the use of AI scribe software for clinical note transcription with the patient, who gave verbal consent to proceed.  History of Present Illness   Rachael Patton is a 23 y.o. 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  She experiences swelling and tingling in her leg, particularly when on her feet for extended periods. 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 She attempts to mitigate the swelling by wearing comfortable shoes, but sometimes her foot swells, affecting her leg and causing tingling sensations.  She underwent foot surgery a few days ago and has had issues with her foot since she was [age], including a history of high foot arthroscopy. No new rashes or itchiness, except for her face, which is clearer than before.  She has been experiencing a persistent cough and is taking cough medicine as needed. She has not had any infusions since the end of last year and has been on oral medications. She is scheduled for repeat rituximab  infusion coming up and for repeat HRCT Chest later this year.  She experiences stiffness and pain in the ankle, which she describes as 'hurting' and 'stiff'. She has been wearing Nike and Timberland socks, which she finds somewhat helpful in managing the swelling.  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       Previous HPI 09/21/2023 Rachael Patton is a 23 y.o. female here for follow up for myositis  on azathioprine  150 mg daily, prednisone  10 mg daily, and rituximab  1000mg  2 doses every 6 months last doses 03/02/23 and 03/25/23.      She has been experiencing knee pain and swelling, primarily in the left knee, since the beginning of last month. The pain intensifies when her leg hangs off the bed or when standing for extended periods. The left knee appears swollen, as if it has fluid, while the right knee also causes discomfort but does not swell as much.   She is currently on prednisone , at a dose of 10 mg, which she ran out of while working away from home, potentially affecting her symptoms. She has previously used Tylenol  for pain relief, which was effective. She has a history of using non-steroidal anti-inflammatory drugs like ibuprofen  and Aleve before starting steroids.   No new rashes have been noted. She experiences breathing difficulties, such as coughing, when bending over, walking up steps, and lying on her side. There have been no significant changes in these symptoms recently.       Previous HPI 05/16/2023 Rachael Patton is a 23 y.o. 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     She experiences persistent aching pain in her ankle, which worsens with activity and sometimes occurs without any apparent trigger. The pain is  severe enough to impact her ability to walk. She has a history of tarsal coalition and underwent surgery in 2021, which involved shaving some bones. Despite the surgery, she continues to experience decreased range of motion in her ankle.   She is on rituximab  infusions, prednisone , and Imuran  for her condition. She also takes a multivitamin but has not consistently taken her prescribed weekly vitamin D  supplement. Her last vitamin D  level in November was 10, which is considered low.   Last month, she experienced an episode of disorientation and headache after missing her  medication while traveling. She describes waking up feeling disoriented, unable to move her feet, and shaking when trying to drink water. She was treated with fluids in the emergency room, which alleviated her symptoms. She attributes the episode to possible dehydration.   No recent swelling, rashes, or significant respiratory symptoms, although she has experienced occasional coughing when outside, which she attributes to the air. No recent fevers or infections requiring antibiotics. She reports a cramp in her leg yesterday that persisted from midday through the night, but denies any circulation problems such as fingers or toes turning blue or white.         Previous HPI 02/09/2023 Rachael Patton is a 23 y.o. 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  Last rituximab  dose was in May and the second dose of that round was stopped early due to infusion reaction with feeling of throat itching and tongue heaviness.  This resolved within 5 minutes after iv benadryl  and no problems afterwards. Her skin rashes remain active still with a lot of dryness and discoloration patches on the face mostly.  She has seen a partial improvement with topical medication from the dermatologist.  Still has some pain in her back usually just with prolonged standing or walking.  She experiences coughing when taking deep breaths and sometimes with exertion.  She had CT scan earlier this month concerning for some progression of lung fibrosis on imaging.   Previous HPI 10/13/2022 Rachael Patton is a 23 y.o. 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  She had an infusion reaction during the last rituximab  treatment part way through it developed some throat tingling and tightness sensation this resolved after administration of steroids  antihistamine and pausing the infusion it was able to be resumed same day and completed uneventfully.  Symptoms overall have been stable. Ongoing skin rashes thought consistent with eczema and dryness with contribution of long term steroid use at dermatology assessment. She had PFTs last month showed small improvement compared to study last year. Has not been feeling pain or weakness except past month after she fell while cleaning the floor landing on the back of her left thigh with some pain in that area since.     Previous HPI 07/13/22 Rachael Patton is a 23 y.o. female here for follow up for myositis with associated ILD on azathioprine  150 mg daily prednisone  5 mg daily on rituximab  infusion 1 g x 2 doses started in November.  Overall she feels well has not noticed any significant difference tapering the prednisone  from 20 mg down to 5 mg daily.  No weakness.  No coughing or shortness of breath.  Skin rashes remain a problem particularly on the face and neck and she has appointment  scheduled with dermatology coming up this week.   Previous HPI 04/13/22 Rachael Patton is a 23 y.o. 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 She had no complications with the treatment. No change of dyspnea or cough symptoms. She had a visit to the ED with some left sided chest pain and workup including CT at that time looked fine. She has noticed some more facial rash with skin peeling and dark areas under her eyes.     Previous HPI 01/08/22 Rachael Patton is a 23 y.o. 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 Since increasing the steroid dose her facial rashes are improving. She is gaining some weight on this. No particular coughing or shortness of breath complaint currently. No weakness or trouble walking or stairs.   Previous  HPI 11/06/2021  Rachael Patton is a 23 y.o. female here for follow up for polymyositis on azathioprine  150 mg daily and prednisone  5 mg daily.  We increased her azathioprine  dose after last visit due to elevated CK levels she had some generalized fatigue but no focal changes in muscle function at that time.  Pulmonology clinic follow-up concerning for deficits.  She thinks her fatigue or energy may be a little bit better but there is some increase in hyperpigmented facial rashes and some swelling around both eyes is new.  No visual changes or pain just notices the swelling   Previous HPI 07/17/2021 Rachael Patton is a 23 y.o. female here for follow up for polymyositis on azathioprine  100 mg daily and prednisone  5 mg daily. She has developed a persistent nonproductive cough again since our last visit. Not associated with shortness of breath or chest pain. She has eye dryness and itching. Facial hyperpigmented rashes are increased the areas on her arms are about the same. Rashes not particularly painful or itchy. She denies any weakness or mobility problem denies any swallowing problems.   Previous HPI 04/17/21 Rachael Patton is a 23 y.o. female here for follow up for polymyositis on azathioprine  100 mg PO daily and prednisone  5 mg daily. She deliver her daughter about a month ago without major complications. She slipped her left leg during laboring and felt pain in the anterior hip that has gradually improved over time.   Previous HPI: 05/08/20 Rachael Patton is a 23 y.o. female with history of hypothyroidism, elevated prolactin here for follow up of hospitalization last week with proximal muscle weakness found to have highly elevated CK and myoglobinuria. This was treated with IV fluids and started steroid treatment with improvement in CK from 11,796 to 4,047. She was discharged on prednisone  80mg  per day dose. Prior to this she was ill with COVID a month ago. She was having some type of GI illness in  December not clear if this was related to COVID infection or separate process. Workup with labs on 1/6 and 1/7 showing transaminitis also positive Ab tests for COVID19 and Hepatitis A. Since the hospital visit she feels her symptoms are partially improved. She notices continued leg swelling and has some dyspnea with lying supine and on exertion. Skin rash on the face is slightly less warm and severe than before. She thinks strength is slightly better but not at her baseline at all.   Review of Systems  Constitutional:  Negative for fatigue.  HENT:  Negative for mouth sores and  mouth dryness.   Eyes:  Positive for dryness.  Respiratory:  Negative for shortness of breath.   Cardiovascular:  Negative for chest pain and palpitations.  Gastrointestinal:  Negative for blood in stool, constipation and diarrhea.  Endocrine: Negative for increased urination.  Genitourinary:  Negative for involuntary urination.  Musculoskeletal:  Positive for joint swelling. Negative for joint pain, gait problem, joint pain, myalgias, muscle weakness, morning stiffness, muscle tenderness and myalgias.  Skin:  Negative for color change, rash, hair loss and sensitivity to sunlight.  Allergic/Immunologic: Negative for susceptible to infections.  Neurological:  Positive for headaches. Negative for dizziness.  Hematological:  Negative for swollen glands.  Psychiatric/Behavioral:  Negative for depressed mood and sleep disturbance. The patient is not nervous/anxious.     PMFS History:  Patient Active Problem List   Diagnosis Date Noted   Vitamin D  deficiency 02/09/2023   Rash and other nonspecific skin eruption 10/13/2022   Screening for tuberculosis 11/20/2021   SS-A antibody positive 11/26/2020   Polymyositis (HCC) 07/18/2020   ILD (interstitial lung disease) (HCC) 06/25/2020   Myositis associated antibody positive 06/23/2020   High risk medication use 05/08/2020   Morbid obesity (HCC) 04/30/2020   Prolactin  increased 04/30/2020   Hypothyroidism 04/30/2020   Prediabetes 04/30/2020   Iron  deficiency anemia 04/30/2020   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    Past Medical History:  Diagnosis Date   Anemia    Asthma    As a child   Myositis    Prediabetes    Rhabdomyolysis    Thyroid  disease    found at age 47    Family History  Problem Relation Age of Onset   Multiple sclerosis Mother    Diabetes Mother    Diabetes Maternal Grandmother    Diabetes Paternal Grandmother    Stroke Paternal Grandmother    Stroke Paternal Grandfather    Diabetes Paternal Grandfather    Multiple sclerosis Cousin    Lupus Cousin    Past Surgical History:  Procedure Laterality Date   FOOT SURGERY Left 06/2019   MUSCLE BIOPSY Right 05/14/2020   Procedure: RIGHT THIGH MUSCLE BIOPSY;  Surgeon: Dasie Leonor CROME, MD;  Location: [facility] OR;  Service: General;  Laterality: Right;  ROOM 3 STARTING AT 04:00PM FOR   Social History   Social History Narrative   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; forensic pyschology   Lives with boyfriend   No children   Immunization History  Administered Date(s) Administered   DTaP 09/26/1998, 04/02/1999, 05/08/1999, 04/08/2000, 09/05/2002   HIB (PRP-OMP) 09/26/1998, 04/02/1999, 05/08/1999, 08/21/1999   HPV 9-valent 01/31/2020   HPV Quadrivalent 01/22/2011, 07/06/2016, 11/20/2019   Hepatitis A, Ped/Adol-2 Dose 11/20/2010, 07/06/2016   Hepatitis B, PED/ADOLESCENT 11/17/1998, 04/21/1999, 04/08/2000   IPV 11/22/1998, 04/02/1999, 04/08/2000, 09/05/2002   Influenza,inj,Quad PF,6+ Mos 01/20/2022   MMR 04/08/2000, 09/05/2002   MenQuadfi_Meningococcal Groups ACYW Conjugate 11/20/2010, 07/06/2016   PFIZER(Purple Top)SARS-COV-2 Vaccination 04/17/2020   Pneumococcal Conjugate PCV 7 04/02/1999, 05/08/1999, 08/21/1999   Tdap 11/14/2009, 03/17/2021     Objective: Vital Signs: BP 131/71   Pulse (!) 105   Temp 97.9 F (36.6 C)   Resp 16   Ht 5' 5 (1.651 m)   Wt  279 lb 6.4 oz (126.7 kg)   LMP 01/08/2024   BMI 46.49 kg/m    Physical Exam Constitutional:      Appearance: She is obese.  Eyes:     Conjunctiva/sclera: Conjunctivae normal.  Cardiovascular:     Rate and Rhythm:  Normal rate and regular rhythm.  Pulmonary:     Effort: Pulmonary effort is normal.     Breath sounds: Normal breath sounds.  Lymphadenopathy:     Cervical: No cervical adenopathy.  Skin:    General: Skin is warm and dry.     Comments: Flat irregular hyper and hypopigmented areas on the face but no overlying scaling or papules No digital pitting   Neurological:     Mental Status: She is alert.  Psychiatric:        Mood and Affect: Mood normal.      Musculoskeletal Exam:  Shoulders full ROM no tenderness or swelling Elbows full ROM no tenderness or swelling Wrists full ROM no tenderness or swelling Fingers full ROM no tenderness or swelling Knees full ROM, trace left knee effusion, tenderness to pressure no warmth or erythema Ankles decreased ROM on left, soft tissue thickening no pitting edema or palpable joint effusion  Investigation: No additional findings.  Imaging: No results found.  Recent Labs: Lab Results  Component Value Date   WBC 9.2 09/21/2023   HGB 12.2 09/21/2023   PLT 343 09/21/2023   NA 135 09/21/2023   K 4.8 09/21/2023   CL 100 09/21/2023   CO2 26 09/21/2023   GLUCOSE 102 (H) 09/21/2023   BUN 9 09/21/2023   CREATININE 0.79 09/21/2023   BILITOT 0.5 09/21/2023   ALKPHOS 90 03/24/2023   AST 16 09/21/2023   ALT 11 09/21/2023   PROT 8.0 09/21/2023   ALBUMIN 3.7 03/24/2023   CALCIUM 9.0 09/21/2023   GFRAA 153 07/30/2020   QFTBGOLDPLUS INDETERMINATE (A) 01/08/2022    Speciality Comments: No specialty comments available.  Procedures:  No procedures performed Allergies: Patient has no known allergies.   Assessment / Plan:     Visit Diagnoses: Polymyositis (HCC)  ILD (interstitial lung disease) (HCC) - Plan: Sedimentation rate,  CK Managed with Imuran  and prednisone . Reports ongoing coughing. Lung scan planned to assess for damage or inflammation. Goal to reduce medication reliance, especially steroids, if lung function permits. Infusions off schedule, next planned soon. Preference to discontinue steroids if respiratory symptoms remain stable. - Checking sed rate, CK for disease activity monitoring - Rituximab  infusions 1000 mg IV q6mos - Continue AZA 150 mg daily - Lung CT scan planned in November or December. - Check lab results to assess medication levels. - Consider stopping prednisone  if lung scan results are favorable.  High risk medication use - azathioprine  150 mg daily, rituximab  infusions 2 doses 1000 mg IV each 6 months - Plan: CBC with Differential/Platelet, Comprehensive metabolic panel with GFR, QuantiFERON-TB Gold Plus Appears to be tolerating medications okay overall.  No serious interval infections. 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 Swelling exacerbated by prolonged standing and walking. No major red flags. Compression socks may help. Consider ankle fusion surgery if arthritis worsens. - Recommend wearing compression socks during prolonged standing or walking. - Monitor for any worsening symptoms or increased swelling.        Orders: Orders Placed This Encounter  Procedures   Sedimentation rate   CK   CBC with Differential/Platelet   Comprehensive metabolic panel with GFR   QuantiFERON-TB Gold Plus  No orders of the defined types were placed in this encounter.    Follow-Up Instructions: Return in about 3 months (around 04/12/2024) for PM/ILD on  AZA/GC/RTX f/u 3mos.   Lonni LELON Ester, MD  Note - This record has been created using [employer].  Chart creation errors have been sought, but may not always  have been located. Such creation errors do not reflect on  the standard of medical care.

## 2023-12-28 NOTE — Progress Notes (Addendum)
 Therapy plan placed for Rituxan  [phone]) for [facility] to start benefits investigation  Diagnosis: ILD with polymyositis  Provider: Dr. Lonni Ester  Dose: 1000 mg at Day 0 then Day 14. Repeat cycle every 6 months Premedications: acetaminophen  650mg  po and diphenhydramine  50mg  po and Solumedrol 125mg  IVPB  REPEAT TB GOLD  Last Clinic Visit: 09/21/2023 Next Clinic Visit: 01/11/2024 Last infusion: 03/02/23 and 03/25/23  Pertinent Labs:  09/21/23 - CBC/CMP/CK/sed rate   Sherry Pennant, PharmD, MPH, BCPS, CPP Clinical Pharmacist (Rheumatology and Pulmonology)

## 2024-01-11 ENCOUNTER — Ambulatory Visit: Attending: Internal Medicine | Admitting: Internal Medicine

## 2024-01-11 ENCOUNTER — Encounter: Payer: Self-pay | Admitting: Internal Medicine

## 2024-01-11 VITALS — BP 131/71 | HR 105 | Temp 97.9°F | Resp 16 | Ht 65.0 in | Wt 279.4 lb

## 2024-01-11 DIAGNOSIS — E559 Vitamin D deficiency, unspecified: Secondary | ICD-10-CM

## 2024-01-11 DIAGNOSIS — Z79899 Other long term (current) drug therapy: Secondary | ICD-10-CM | POA: Diagnosis not present

## 2024-01-11 DIAGNOSIS — M7989 Other specified soft tissue disorders: Secondary | ICD-10-CM

## 2024-01-11 DIAGNOSIS — Z7952 Long term (current) use of systemic steroids: Secondary | ICD-10-CM | POA: Diagnosis not present

## 2024-01-11 DIAGNOSIS — M3320 Polymyositis, organ involvement unspecified: Secondary | ICD-10-CM | POA: Diagnosis not present

## 2024-01-11 DIAGNOSIS — J849 Interstitial pulmonary disease, unspecified: Secondary | ICD-10-CM

## 2024-01-13 LAB — COMPREHENSIVE METABOLIC PANEL WITH GFR
AG Ratio: 0.9 (calc) — ABNORMAL LOW (ref 1.0–2.5)
ALT: 7 U/L (ref 6–29)
AST: 12 U/L (ref 10–30)
Albumin: 4 g/dL (ref 3.6–5.1)
Alkaline phosphatase (APISO): 98 U/L (ref 31–125)
BUN: 9 mg/dL (ref 7–25)
CO2: 27 mmol/L (ref 20–32)
Calcium: 8.8 mg/dL (ref 8.6–10.2)
Chloride: 102 mmol/L (ref 98–110)
Creat: 0.81 mg/dL (ref 0.50–0.96)
Globulin: 4.3 g/dL — ABNORMAL HIGH (ref 1.9–3.7)
Glucose, Bld: 172 mg/dL — ABNORMAL HIGH (ref 65–99)
Potassium: 3.9 mmol/L (ref 3.5–5.3)
Sodium: 136 mmol/L (ref 135–146)
Total Bilirubin: 0.4 mg/dL (ref 0.2–1.2)
Total Protein: 8.3 g/dL — ABNORMAL HIGH (ref 6.1–8.1)
eGFR: 103 mL/min/1.73m2 (ref >=60)

## 2024-01-13 LAB — CBC WITH DIFFERENTIAL/PLATELET
Absolute Lymphocytes: 474 {cells}/uL — ABNORMAL LOW (ref 850–3900)
Absolute Monocytes: 340 {cells}/uL (ref 200–950)
Basophils Absolute: 31 {cells}/uL (ref 0–200)
Basophils Relative: 0.3 %
Eosinophils Absolute: 82 {cells}/uL (ref 15–500)
Eosinophils Relative: 0.8 %
HCT: 36.7 % (ref 35.0–45.0)
Hemoglobin: 11.8 g/dL (ref 11.7–15.5)
MCH: 28.6 pg (ref 27.0–33.0)
MCHC: 32.2 g/dL (ref 32.0–36.0)
MCV: 89.1 fL (ref 80.0–100.0)
MPV: 11.3 fL (ref 7.5–12.5)
Monocytes Relative: 3.3 %
Neutro Abs: 9373 {cells}/uL — ABNORMAL HIGH (ref 1500–7800)
Neutrophils Relative %: 91 %
Platelets: 361 Thousand/uL (ref 140–400)
RBC: 4.12 Million/uL (ref 3.80–5.10)
RDW: 14.7 % (ref 11.0–15.0)
Total Lymphocyte: 4.6 %
WBC: 10.3 Thousand/uL (ref 3.8–10.8)

## 2024-01-13 LAB — QUANTIFERON-TB GOLD PLUS
Mitogen-NIL: 0.12 [IU]/mL
NIL: 0.02 [IU]/mL
QuantiFERON-TB Gold Plus: UNDETERMINED — AB
TB1-NIL: 0 [IU]/mL
TB2-NIL: <0 [IU]/mL

## 2024-01-13 LAB — CK: Total CK: 154 U/L (ref 20–239)

## 2024-01-13 LAB — SEDIMENTATION RATE: Sed Rate: 94 mm/h — ABNORMAL HIGH (ref 0–20)

## 2024-01-20 ENCOUNTER — Ambulatory Visit (INDEPENDENT_AMBULATORY_CARE_PROVIDER_SITE_OTHER)

## 2024-01-20 VITALS — BP 100/71 | HR 76 | Temp 98.1°F | Resp 16 | Ht 65.0 in | Wt 279.8 lb

## 2024-01-20 DIAGNOSIS — R7689 Other specified abnormal immunological findings in serum: Secondary | ICD-10-CM

## 2024-01-20 DIAGNOSIS — J849 Interstitial pulmonary disease, unspecified: Secondary | ICD-10-CM | POA: Diagnosis not present

## 2024-01-20 DIAGNOSIS — M3320 Polymyositis, organ involvement unspecified: Secondary | ICD-10-CM

## 2024-01-20 DIAGNOSIS — Z79899 Other long term (current) drug therapy: Secondary | ICD-10-CM

## 2024-01-20 DIAGNOSIS — Z111 Encounter for screening for respiratory tuberculosis: Secondary | ICD-10-CM

## 2024-01-20 MED ORDER — SODIUM CHLORIDE 0.9 % IV SOLN
1000.0000 mg | Freq: Once | INTRAVENOUS | Status: AC
  Administered 2024-01-20: 1000 mg via INTRAVENOUS
  Filled 2024-01-20: qty 100

## 2024-01-20 MED ORDER — METHYLPREDNISOLONE SODIUM SUCC 125 MG IJ SOLR
125.0000 mg | Freq: Once | INTRAMUSCULAR | Status: AC
  Administered 2024-01-20: 125 mg via INTRAVENOUS
  Filled 2024-01-20: qty 2

## 2024-01-20 MED ORDER — ACETAMINOPHEN 325 MG PO TABS
650.0000 mg | ORAL_TABLET | Freq: Once | ORAL | Status: AC
  Administered 2024-01-20: 650 mg via ORAL
  Filled 2024-01-20: qty 2

## 2024-01-20 MED ORDER — DIPHENHYDRAMINE HCL 25 MG PO CAPS
50.0000 mg | ORAL_CAPSULE | Freq: Once | ORAL | Status: AC
  Administered 2024-01-20: 50 mg via ORAL
  Filled 2024-01-20: qty 2

## 2024-01-20 NOTE — Progress Notes (Signed)
 Diagnosis: ILD  Provider:  Lonna Coder MD  Procedure: IV Infusion  IV Type: Peripheral, IV Location: R Forearm  Rituxan  (Rituximab ), Dose: 1000 mg  Infusion Start Time: 0912  Infusion Stop Time: 1253  Post Infusion IV Care: Peripheral IV Discontinued  Discharge: Condition: Good, Destination: Home . AVS Declined  Performed by:  Leita FORBES Miles, LPN

## 2024-01-23 ENCOUNTER — Other Ambulatory Visit: Payer: Self-pay | Admitting: Pharmacist

## 2024-01-23 NOTE — Progress Notes (Signed)
 TB gold order placed to be redrawn with rituximab  infusion on 02/03/24.  TB gold was indeterminate on 01/11/2024  Sherry Pennant, PharmD, MPH, BCPS, CPP Clinical Pharmacist [facility])

## 2024-01-30 ENCOUNTER — Telehealth: Payer: Self-pay

## 2024-01-30 NOTE — Telephone Encounter (Signed)
 Patient contacted the office and inquires if there is any way she can get her infusions done closer to [place], [place] or [place] in [place]. Patient states that will be closer to her. Advised the patient I would send a message to the pharmacy team to see if that is possible. Advised the patient she may need to continue her infusions at the current location, because her next infusion is Friday, until the pharmacy team can look into it. Patient verbalized understanding. Please advise.

## 2024-01-30 NOTE — Telephone Encounter (Signed)
 There is a [facility] in [place] . D/w Dr Jeannetta- okay if infusion must be delayed by a few weeks however ideally patient receives on 02/03/2024. He is considering pushing her dosing to 1000mg  single dose every 6 months (instead of 2 week cycle every 6 months).  Spoke with patient - she is only temporarily in [place], [place].  Will see if she can be rescheduled to [facility] for geographic proximity.   Sherry Pennant, PharmD, MPH, BCPS, CPP Clinical Pharmacist [facility])

## 2024-01-30 NOTE — Telephone Encounter (Signed)
 Per SPPA team, an updated authorization would need to be obtained for patient to transfer infusion to [place]. Will keep at [facility]  Sherry Pennant, PharmD, MPH, BCPS, CPP Clinical Pharmacist [facility])

## 2024-02-03 ENCOUNTER — Other Ambulatory Visit: Payer: Self-pay | Admitting: Internal Medicine

## 2024-02-03 ENCOUNTER — Ambulatory Visit (INDEPENDENT_AMBULATORY_CARE_PROVIDER_SITE_OTHER)

## 2024-02-03 VITALS — BP 106/71 | HR 92 | Temp 98.1°F | Resp 18 | Ht 65.0 in | Wt 272.8 lb

## 2024-02-03 DIAGNOSIS — M3320 Polymyositis, organ involvement unspecified: Secondary | ICD-10-CM

## 2024-02-03 DIAGNOSIS — Z79899 Other long term (current) drug therapy: Secondary | ICD-10-CM | POA: Diagnosis not present

## 2024-02-03 DIAGNOSIS — R7689 Other specified abnormal immunological findings in serum: Secondary | ICD-10-CM | POA: Diagnosis not present

## 2024-02-03 DIAGNOSIS — Z111 Encounter for screening for respiratory tuberculosis: Secondary | ICD-10-CM

## 2024-02-03 DIAGNOSIS — J849 Interstitial pulmonary disease, unspecified: Secondary | ICD-10-CM

## 2024-02-03 MED ORDER — DIPHENHYDRAMINE HCL 25 MG PO CAPS
50.0000 mg | ORAL_CAPSULE | Freq: Once | ORAL | Status: AC
  Administered 2024-02-03: 50 mg via ORAL
  Filled 2024-02-03: qty 2

## 2024-02-03 MED ORDER — ACETAMINOPHEN 325 MG PO TABS
650.0000 mg | ORAL_TABLET | Freq: Once | ORAL | Status: AC
  Administered 2024-02-03: 650 mg via ORAL
  Filled 2024-02-03: qty 2

## 2024-02-03 MED ORDER — SODIUM CHLORIDE 0.9 % IV SOLN
1000.0000 mg | Freq: Once | INTRAVENOUS | Status: AC
  Administered 2024-02-03: 1000 mg via INTRAVENOUS
  Filled 2024-02-03: qty 100

## 2024-02-03 MED ORDER — METHYLPREDNISOLONE SODIUM SUCC 125 MG IJ SOLR
125.0000 mg | Freq: Once | INTRAMUSCULAR | Status: AC
  Administered 2024-02-03: 125 mg via INTRAVENOUS
  Filled 2024-02-03: qty 2

## 2024-02-03 NOTE — Progress Notes (Signed)
 Diagnosis: ILD  Provider:  Mannam, Praveen MD  Procedure: IV Infusion  IV Type: Peripheral, IV Location: R Forearm  Rituxan  (Rituximab ), Dose: 1000 mg  Infusion Start Time: 0917  Infusion Stop Time: 1117  Post Infusion IV Care: Peripheral IV Discontinued  Discharge: Condition: Good, Destination: Home . AVS Declined  Performed by:  Rocky FORBES Sar, RN

## 2024-02-07 LAB — QUANTIFERON-TB GOLD PLUS
Mitogen-NIL: 0.09 [IU]/mL
NIL: 0.03 [IU]/mL
QuantiFERON-TB Gold Plus: UNDETERMINED — AB
TB1-NIL: 0 [IU]/mL
TB2-NIL: <0 [IU]/mL

## 2024-02-07 LAB — HOUSE ACCOUNT TRACKING

## 2024-02-15 ENCOUNTER — Ambulatory Visit: Payer: Self-pay | Admitting: Pharmacist

## 2024-02-15 NOTE — Progress Notes (Signed)
 D/w Dr. Jeannetta - will discontinue yearly TB gold. Patient is chronically immunosuppressed. Low suspicious for TB. She is getting yearly chest xray (likely HRCT) for her ILD diagnosis. This should also monitor for any concerns for TB  Sherry Pennant, PharmD, MPH, BCPS, CPP Clinical Pharmacist [facility])

## 2024-02-27 ENCOUNTER — Ambulatory Visit
Admission: RE | Admit: 2024-02-27 | Discharge: 2024-02-27 | Disposition: A | Source: Ambulatory Visit | Attending: Pulmonary Disease | Admitting: Pulmonary Disease

## 2024-02-27 ENCOUNTER — Other Ambulatory Visit

## 2024-02-27 ENCOUNTER — Encounter: Payer: Self-pay | Admitting: Internal Medicine

## 2024-02-27 DIAGNOSIS — J849 Interstitial pulmonary disease, unspecified: Secondary | ICD-10-CM

## 2024-03-01 ENCOUNTER — Ambulatory Visit: Payer: Self-pay | Admitting: Pulmonary Disease

## 2024-03-01 ENCOUNTER — Ambulatory Visit (HOSPITAL_BASED_OUTPATIENT_CLINIC_OR_DEPARTMENT_OTHER): Admitting: Pulmonary Disease

## 2024-04-02 NOTE — Progress Notes (Signed)
 "  Office Visit Note  Patient: Rachael Patton             Date of Birth: Sep 29, 2000           MRN: [id number]             PCP: Job Lukes, PA Referring: Job Lukes, PA Visit Date: 04/13/2024   Subjective:   Discussed the use of AI scribe software for clinical note transcription with the patient, who gave verbal consent to proceed.  History of Present Illness   Rachael Patton is a 24 y.o. 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  She has not experienced any significant issues with her current medications, which include two oral medications and infusions well tolerated.  Approximately one month ago, she had flu-like symptoms and took over-the-counter cough and cold medicine to manage them. No falls, injuries, skin rashes, or swelling have occurred since her last visit.   She had follow up with Dr. Jude yesterday with stable lung imaging and symptoms.      Previous HPI 01/11/2024 Rachael Patton is a 24 y.o. 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   She experiences swelling and tingling in her leg, particularly when on her feet for extended periods. 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 She attempts to mitigate the swelling by wearing comfortable shoes, but sometimes her foot swells, affecting her leg and causing tingling sensations.   She underwent foot surgery a few days ago and has had issues with her foot since she was [age], including a history of high foot arthroscopy. No  new rashes or itchiness, except for her face, which is clearer than before.   She has been experiencing a persistent cough and is taking cough medicine as needed. She has not had any infusions since the end of last year and has been on oral medications. She is scheduled for repeat rituximab  infusion coming up and for repeat HRCT Chest later this year.   She experiences stiffness and pain in the ankle, which she describes as 'hurting' and 'stiff'. She has been wearing Nike and [employer] socks, which she finds somewhat helpful in managing the swelling.   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         Previous HPI 09/21/2023 Rachael Patton is a 24 y.o. 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      She has been experiencing knee pain and swelling, primarily in the left knee, since the beginning of last month. The pain intensifies when her leg hangs off the bed or when standing for extended periods. The left knee appears swollen, as if it has fluid, while the right knee also causes discomfort but does not swell as much.   She is currently on prednisone , at a dose of 10 mg, which she  ran out of while working away from home, potentially affecting her symptoms. She has previously used Tylenol  for pain relief, which was effective. She has a history of using non-steroidal anti-inflammatory drugs like ibuprofen  and Aleve before starting steroids.   No new rashes have been noted. She experiences breathing difficulties, such as coughing, when bending over, walking up steps, and lying on her side. There have been no significant changes in these symptoms recently.       Previous HPI 05/16/2023 Rachael Patton is a 24 y.o. female here for follow up for myositis on azathioprine  and 150 mg daily  prednisone  5 mg daily and rituximab  1000mg  2 doses every 6 months.     She experiences persistent aching pain in her ankle, which worsens with activity and sometimes occurs without any apparent trigger. The pain is severe enough to impact her ability to walk. She has a history of tarsal coalition and underwent surgery in 2021, which involved shaving some bones. Despite the surgery, she continues to experience decreased range of motion in her ankle.   She is on rituximab  infusions, prednisone , and Imuran  for her condition. She also takes a multivitamin but has not consistently taken her prescribed weekly vitamin D  supplement. Her last vitamin D  level in November was 10, which is considered low.   Last month, she experienced an episode of disorientation and headache after missing her medication while traveling. She describes waking up feeling disoriented, unable to move her feet, and shaking when trying to drink water. She was treated with fluids in the emergency room, which alleviated her symptoms. She attributes the episode to possible dehydration.   No recent swelling, rashes, or significant respiratory symptoms, although she has experienced occasional coughing when outside, which she attributes to the air. No recent fevers or infections requiring antibiotics. She reports a cramp in her leg yesterday that persisted from midday through the night, but denies any circulation problems such as fingers or toes turning blue or white.         Previous HPI 02/09/2023 Rachael Patton is a 24 y.o. 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  Last rituximab  dose was in May and the second dose of that round was stopped early due to infusion reaction with feeling of throat itching and tongue heaviness.  This resolved within 5 minutes after iv benadryl  and no problems afterwards. Her skin rashes remain active still with a  lot of dryness and discoloration patches on the face mostly.  She has seen a partial improvement with topical medication from the dermatologist.  Still has some pain in her back usually just with prolonged standing or walking.  She experiences coughing when taking deep breaths and sometimes with exertion.  She had CT scan earlier this month concerning for some progression of lung fibrosis on imaging.   Previous HPI 10/13/2022 Kineta Fudala is a 24 y.o. 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  She had an infusion reaction during the last rituximab  treatment part way through it developed some throat tingling and tightness sensation this resolved after administration of steroids antihistamine and pausing the infusion it was able to be resumed same day and completed uneventfully.  Symptoms overall have been stable. Ongoing skin rashes thought consistent with eczema and dryness with contribution of long term  steroid use at dermatology assessment. She had PFTs last month showed small improvement compared to study last year. Has not been feeling pain or weakness except past month after she fell while cleaning the floor landing on the back of her left thigh with some pain in that area since.     Previous HPI 07/13/22 Aseret Hoffman is a 24 y.o. female here for follow up for myositis with associated ILD on azathioprine  150 mg daily prednisone  5 mg daily on rituximab  infusion 1 g x 2 doses started in November.  Overall she feels well has not noticed any significant difference tapering the prednisone  from 20 mg down to 5 mg daily.  No weakness.  No coughing or shortness of breath.  Skin rashes remain a problem particularly on the face and neck and she has appointment scheduled with dermatology coming up this week.   Previous HPI 04/13/22 Bitania Shankland is a 24 y.o. female here for follow up polymyositis with  ILD on azathioprine  150 mg daily prednisone  20 mg daily and started rituximab  first infusions 11/6 and 11/20. She had no complications with the treatment. No change of dyspnea or cough symptoms. She had a visit to the ED with some left sided chest pain and workup including CT at that time looked fine. She has noticed some more facial rash with skin peeling and dark areas under her eyes.     Previous HPI 01/08/22 Latresa Gasser is a 24 y.o. 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 Since increasing the steroid dose her facial rashes are improving. She is gaining some weight on this. No particular coughing or shortness of breath complaint currently. No weakness or trouble walking or stairs.   Previous HPI 11/06/2021  Taylinn Brabant is a 24 y.o. female here for follow up for polymyositis on azathioprine  150 mg daily and prednisone  5 mg daily.  We increased her azathioprine  dose after last visit due to elevated CK levels she had some generalized fatigue but no focal changes in muscle function at that time.  Pulmonology clinic follow-up concerning for deficits.  She thinks her fatigue or energy may be a little bit better but there is some increase in hyperpigmented facial rashes and some swelling around both eyes is new.  No visual changes or pain just notices the swelling   Previous HPI 07/17/2021 Ayanah Snader is a 24 y.o. female here for follow up for polymyositis on azathioprine  100 mg daily and prednisone  5 mg daily. She has developed a persistent nonproductive cough again since our last visit. Not associated with shortness of breath or chest pain. She has eye dryness and itching. Facial hyperpigmented rashes are increased the areas on her arms are about the same. Rashes not particularly painful or itchy. She denies any weakness or mobility problem denies any swallowing problems.   Previous HPI 04/17/21 Danai Gotto is a 24 y.o.  female here for follow up for polymyositis on azathioprine  100 mg PO daily and prednisone  5 mg daily. She deliver her daughter about a month ago without major complications. She slipped her left leg during laboring and felt pain in the anterior hip that has gradually improved over time.   Previous HPI: 05/08/20 Sherrin Stahle is a 24 y.o. female with history of hypothyroidism, elevated prolactin here for follow up of hospitalization last week with proximal muscle weakness found to have highly elevated CK and myoglobinuria. This was treated with IV fluids and started  steroid treatment with improvement in CK from 11,796 to 4,047. She was discharged on prednisone  80mg  per day dose. Prior to this she was ill with COVID a month ago. She was having some type of GI illness in December not clear if this was related to COVID infection or separate process. Workup with labs on 1/6 and 1/7 showing transaminitis also positive Ab tests for COVID19 and Hepatitis A. Since the hospital visit she feels her symptoms are partially improved. She notices continued leg swelling and has some dyspnea with lying supine and on exertion. Skin rash on the face is slightly less warm and severe than before. She thinks strength is slightly better but not at her baseline at all.   Review of Systems  Constitutional:  Negative for fatigue.  HENT:  Positive for mouth dryness. Negative for mouth sores.   Eyes:  Positive for dryness.  Respiratory:  Negative for shortness of breath.   Cardiovascular:  Positive for chest pain. Negative for palpitations.  Gastrointestinal:  Negative for blood in stool, constipation and diarrhea.  Endocrine: Negative for increased urination.  Genitourinary:  Negative for involuntary urination.  Musculoskeletal:  Positive for joint pain, joint pain and morning stiffness. Negative for gait problem, joint swelling, myalgias, muscle weakness, muscle tenderness and myalgias.  Skin:  Negative for color change,  rash, hair loss and sensitivity to sunlight.  Allergic/Immunologic: Positive for susceptible to infections.  Neurological:  Negative for dizziness and headaches.  Hematological:  Negative for swollen glands.  Psychiatric/Behavioral:  Negative for depressed mood and sleep disturbance. The patient is not nervous/anxious.     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    Past Medical History:  Diagnosis Date   Anemia    Asthma    As a child   Myositis    Prediabetes    Rhabdomyolysis    Thyroid  disease    found at age 19    Family History  Problem Relation Age of Onset   Multiple sclerosis Mother    Diabetes Mother    Diabetes Maternal Grandmother    Diabetes Paternal Grandmother    Stroke Paternal Grandmother    Stroke Paternal Grandfather    Diabetes Paternal Grandfather    Multiple sclerosis Cousin    Lupus Cousin    Past Surgical History:  Procedure Laterality Date   FOOT SURGERY Left 06/2019   MUSCLE BIOPSY Right 05/14/2020   Procedure: RIGHT THIGH MUSCLE BIOPSY;  Surgeon: Dasie Leonor CROME, MD;  Location: [facility] OR;  Service: General;  Laterality: Right;  ROOM 3 STARTING AT 04:00PM FOR   Social History   Social History Narrative   Graduated from [employer] in sociology/toxicology; forensic pyschology   Lives with boyfriend   No children   Immunization History  Administered Date(s) Administered   DTaP  09/26/1998, 04/02/1999, 05/08/1999, 04/08/2000, 09/05/2002   HIB (PRP-OMP) 09/26/1998, 04/02/1999, 05/08/1999, 08/21/1999   HPV 9-valent 01/31/2020   HPV Quadrivalent 01/22/2011, 07/06/2016, 11/20/2019  Hepatitis A, Ped/Adol-2 Dose 11/20/2010, 07/06/2016   Hepatitis B, PED/ADOLESCENT 11/17/1998, 04/21/1999, 04/08/2000   IPV 11/22/1998, 04/02/1999, 04/08/2000, 09/05/2002   Influenza, Seasonal, Injecte, Preservative Fre 04/12/2024   Influenza,inj,Quad PF,6+ Mos 01/20/2022   MMR 04/08/2000, 09/05/2002   MenQuadfi_Meningococcal Groups ACYW Conjugate 11/20/2010, 07/06/2016   PFIZER(Purple Top)SARS-COV-2 Vaccination 04/17/2020   Pneumococcal Conjugate PCV 7 04/02/1999, 05/08/1999, 08/21/1999   Tdap 11/14/2009, 03/17/2021     Objective: Vital Signs: BP 100/62   Pulse 64   Temp (!) 97.4 F (36.3 C)   Resp 14   Ht 5' 5 (1.651 m)   Wt 267 lb (121.1 kg)   LMP 03/29/2024   BMI 44.43 kg/m    Physical Exam Constitutional:      Appearance: She is obese.  Eyes:     Conjunctiva/sclera: Conjunctivae normal.  Cardiovascular:     Rate and Rhythm: Normal rate and regular rhythm.  Pulmonary:     Effort: Pulmonary effort is normal.     Breath sounds: Normal breath sounds.  Lymphadenopathy:     Cervical: No cervical adenopathy.  Skin:    General: Skin is warm and dry.     Findings: Rash present.     Comments: Flat irregular hyper and hypopigmented areas on the face but no overlying scaling or papules No digital pitting    Neurological:     Mental Status: She is alert.  Psychiatric:        Mood and Affect: Mood normal.      Musculoskeletal Exam:  Shoulders full ROM no tenderness or swelling Elbows full ROM no tenderness or swelling Wrists full ROM no tenderness or swelling Fingers full ROM no tenderness or swelling Knees full ROM, trace left knee effusion, tenderness to pressure no warmth or erythema Ankles decreased ROM on left, soft tissue thickening no pitting edema or palpable  joint effusion  Investigation: No additional findings.  Imaging: No results found.  Recent Labs: Lab Results  Component Value Date   WBC 10.3 01/11/2024   HGB 11.8 01/11/2024   PLT 361 01/11/2024   NA 136 01/11/2024   K 3.9 01/11/2024   CL 102 01/11/2024   CO2 27 01/11/2024   GLUCOSE 172 (H) 01/11/2024   BUN 9 01/11/2024   CREATININE 0.81 01/11/2024   BILITOT 0.4 01/11/2024   ALKPHOS 90 03/24/2023   AST 12 01/11/2024   ALT 7 01/11/2024   PROT 8.3 (H) 01/11/2024   ALBUMIN 3.7 03/24/2023   CALCIUM 8.8 01/11/2024   GFRAA 153 07/30/2020   QFTBGOLDPLUS INDETERMINATE (A) 02/03/2024    Speciality Comments: TB gold indeterminate; monitor yearly chest x-rays (ILD)  Procedures:  No procedures performed Allergies: Patient has no known allergies.   Assessment / Plan:     Visit Diagnoses: Polymyositis (HCC) - Plan: Sedimentation rate, CK Well-managed, stable symptoms, no new progression on CT. Slightly elevated neutrophil count likely due to prednisone .  Persistent elevated sed rate but with good CK and stable clinically we will see if we can taper off the remaining prednisone  to reduce long-term side effect. - Continue current azathioprine  100 mg daily and Rituxan  infusions, but next be due May. - Recheck lab numbers sed rate and CK to assess current status. - If lab results stable, attempt to discontinue low dose prednisone . - Monitor for increased joint aches or pains upon discontinuation of prednisone . - Discussed naproxen for joint pain as needed if prednisone  is discontinued.  Long term (current) use of systemic steroids High risk medication use - Plan: CBC with  Differential/Platelet, Comprehensive metabolic panel with GFR Low dose prednisone  contributing to elevated neutrophil count. Plan to discontinue to reduce side effects. Discontinuation may cause temporary fatigue or joint pain.  Previous labs okay for rituximab  and azathioprine  no infusion reactions.  Had interval  viral infection but resolved without major complications or requiring additional medication or interruption of treatment. - Checking CBC CMP for medication monitoring continue long-term use of azathioprine  and rituximab   Orders: Orders Placed This Encounter  Procedures   Sedimentation rate   CBC with Differential/Platelet   Comprehensive metabolic panel with GFR   CK   No orders of the defined types were placed in this encounter.    Follow-Up Instructions: No follow-ups on file.   Lonni LELON Ester, MD  Note - This record has been created using [employer].  Chart creation errors have been sought, but may not always  have been located. Such creation errors do not reflect on  the standard of medical care. "

## 2024-04-12 ENCOUNTER — Encounter (HOSPITAL_BASED_OUTPATIENT_CLINIC_OR_DEPARTMENT_OTHER): Payer: Self-pay | Admitting: Pulmonary Disease

## 2024-04-12 ENCOUNTER — Ambulatory Visit (HOSPITAL_BASED_OUTPATIENT_CLINIC_OR_DEPARTMENT_OTHER): Admitting: Pulmonary Disease

## 2024-04-12 VITALS — BP 111/65 | HR 81 | Ht 65.0 in | Wt 268.7 lb

## 2024-04-12 DIAGNOSIS — J849 Interstitial pulmonary disease, unspecified: Secondary | ICD-10-CM | POA: Diagnosis not present

## 2024-04-12 DIAGNOSIS — Z23 Encounter for immunization: Secondary | ICD-10-CM

## 2024-04-12 DIAGNOSIS — M3320 Polymyositis, organ involvement unspecified: Secondary | ICD-10-CM | POA: Diagnosis not present

## 2024-04-12 MED ORDER — ALBUTEROL SULFATE HFA 108 (90 BASE) MCG/ACT IN AERS: 2.0000 | INHALATION_SPRAY | Freq: Four times a day (QID) | RESPIRATORY_TRACT | 2 refills | Status: AC | PRN

## 2024-04-12 NOTE — Addendum Note (Signed)
 Addended by: TRUDY WARREN CROME on: 04/12/2024 11:54 AM   Modules accepted: Orders

## 2024-04-12 NOTE — Progress Notes (Signed)
 "  Subjective:    Patient ID: Rachael Patton, female    DOB: January 18, 2001, 24 y.o.   MRN: [id number]   24 yo never smoker with polymyositis and ILD/ NSIP SSA +   She was hospitalized 04/2020 with bilateral upper and lower extremity proximal muscle weakness for 3 months, elevated CK myoglobinuria.  She was treated with IV fluids and started on prednisone  80 mg/day.  CK improved from 11,671 [id number].  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  Anti-Jo antibody was negative Rituxan  was started 01/2022   PMH -Covid infection January 2022   rheumatology consultation 01/2023, labs showed increase CK9 1 5 and increased ESR 92, prednisone  was increased to 10 mg daily. She remains on Rituxan  and Imuran  150 mg  08/2023 >>pred decreased to 5 mg, rituxan  q 6 mtonhs   Discussed the use of AI scribe software for clinical note transcription with the patient, who gave verbal consent to proceed.  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  Her cough is stable but worsens with exertion such as bending or cleaning and with cold air exposure, sometimes requiring her to rest in her car after work. Drinking water, albuterol , and occasional nighttime cough syrup provide relief.  She recently had a brief episode of dry cough and chills consistent with a flu-like illness affecting her household. She recovered within days and does not recall fever.  Her current medications are Imuran  150 mg, Rituxan  every six months, prednisone  5 mg, and albuterol  as needed for cough.  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    CK 154  12/2023  Significant tests/ events reviewed    02/2024 HRCT >> stable, alternate , favor fibrotic NSIP   01/2023 HRCT chest >> Progressive fibrotic ILD, some honeycombing, probable UIP 06/2021 HRCT Peribronchovascular ground-glass opacities which are most prominent in the mid and lower lungs with associated small cysts     08/2022 PFTs >> moderate to severe restriction, FVC 48% DLCO 11.2/48%   PFTs 06/2021 moderate to severe intraparenchymal restriction, ratio 75, FEV1 42%, 14% bronchodilator response, FVC 49%, TLC 49%, DLCO 10.99/46%  Review of Systems  neg for any significant sore throat, dysphagia, itching, sneezing, nasal congestion or excess/ purulent secretions, fever, chills, sweats, unintended wt loss, pleuritic or exertional cp, hempoptysis, orthopnea pnd or change in chronic leg swelling. Also denies presyncope, palpitations, heartburn, abdominal pain, nausea, vomiting, diarrhea or change in bowel or urinary habits, dysuria,hematuria, rash, arthralgias, visual complaints, headache, numbness weakness or ataxia.      Objective:   Physical Exam  Gen. 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 Scarring in the lungs is stable compared to the previous year, indicating effective disease control. Cough  is triggered by cold air and physical exertion, but is manageable with hydration and rest. Recent flu-like symptoms resolved without complications. Lung capacity was decreased to  about 50% on a breathing test from two and a half years ago, but the extent of reversibility is uncertain. Scarring is unlikely to reverse, but inflammation may respond to medication. - Continue Imuran , Rituxan , and prednisone  as prescribed. - Ordered breathing test in six months. - Scheduled annual lung scan. - Administered flu shot today. - Refilled albuterol  prescription. - Instructed to call if fever or yellow-green phlegm occurs, indicating need for antibiotics.     "

## 2024-04-12 NOTE — Patient Instructions (Signed)
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 Her condition is stable with her current treatment, and her cough is manageable with hydration and rest. She recently experienced flu-like symptoms but recovered quickly. Her lung scarring is stable, and her lung capacity remains at about 50%.  YOUR PLAN: -INTERSTITIAL LUNG DISEASE RELATED TO POLYMYOSITIS: Interstitial lung disease is a condition that causes scarring and inflammation in the lungs, making it difficult to breathe. It is related to polymyositis, an inflammatory disease that affects muscles. Your condition is well-controlled with your current medications: Imuran , Rituxan , and prednisone . Your cough is triggered by cold air and physical exertion but can be managed with hydration and rest. You recently had flu-like symptoms that resolved without complications. We will continue your current medications, and you will have a breathing test in six months and an annual lung scan. You received a flu shot today, and your albuterol  prescription was refilled. Please call if you develop a fever or yellow-green phlegm, as this may indicate an infection requiring antibiotics.  INSTRUCTIONS: Please continue taking your medications as prescribed. You are scheduled for a breathing test in six months and an annual lung scan. If you develop a fever or yellow-green phlegm, call the office as you may need antibiotics.                      Contains text generated by [employer].                                 Contains text generated by [employer].   "

## 2024-04-13 ENCOUNTER — Ambulatory Visit: Admitting: Internal Medicine

## 2024-04-13 ENCOUNTER — Encounter: Payer: Self-pay | Admitting: Internal Medicine

## 2024-04-13 VITALS — BP 100/62 | HR 64 | Temp 97.4°F | Resp 14 | Ht 65.0 in | Wt 267.0 lb

## 2024-04-13 DIAGNOSIS — M3320 Polymyositis, organ involvement unspecified: Secondary | ICD-10-CM

## 2024-04-13 DIAGNOSIS — Z7952 Long term (current) use of systemic steroids: Secondary | ICD-10-CM

## 2024-04-13 DIAGNOSIS — Z79899 Other long term (current) drug therapy: Secondary | ICD-10-CM

## 2024-04-14 LAB — CBC WITH DIFFERENTIAL/PLATELET
Absolute Lymphocytes: 731 {cells}/uL — ABNORMAL LOW (ref 850–3900)
Absolute Monocytes: 621 {cells}/uL (ref 200–950)
Basophils Absolute: 21 {cells}/uL (ref 0–200)
Basophils Relative: 0.3 %
Eosinophils Absolute: 131 {cells}/uL (ref 15–500)
Eosinophils Relative: 1.9 %
HCT: 37.6 % (ref 35.9–46.0)
Hemoglobin: 11.6 g/dL — ABNORMAL LOW (ref 11.7–15.5)
MCH: 26.8 pg — ABNORMAL LOW (ref 27.0–33.0)
MCHC: 30.9 g/dL — ABNORMAL LOW (ref 31.6–35.4)
MCV: 86.8 fL (ref 81.4–101.7)
MPV: 11.6 fL (ref 7.5–12.5)
Monocytes Relative: 9 %
Neutro Abs: 5396 {cells}/uL (ref 1500–7800)
Neutrophils Relative %: 78.2 %
Platelets: 319 Thousand/uL (ref 140–400)
RBC: 4.33 Million/uL (ref 3.80–5.10)
RDW: 15.3 % — ABNORMAL HIGH (ref 11.0–15.0)
Total Lymphocyte: 10.6 %
WBC: 6.9 Thousand/uL (ref 3.8–10.8)

## 2024-04-14 LAB — SEDIMENTATION RATE: Sed Rate: 39 mm/h — ABNORMAL HIGH (ref 0–20)

## 2024-04-14 LAB — COMPREHENSIVE METABOLIC PANEL WITH GFR
AG Ratio: 1 (calc) (ref 1.0–2.5)
ALT: 7 U/L (ref 6–29)
AST: 14 U/L (ref 10–30)
Albumin: 3.9 g/dL (ref 3.6–5.1)
Alkaline phosphatase (APISO): 87 U/L (ref 31–125)
BUN/Creatinine Ratio: 8 (calc) (ref 6–22)
BUN: 6 mg/dL — ABNORMAL LOW (ref 7–25)
CO2: 29 mmol/L (ref 20–32)
Calcium: 8.9 mg/dL (ref 8.6–10.2)
Chloride: 103 mmol/L (ref 98–110)
Creat: 0.72 mg/dL (ref 0.50–0.96)
Globulin: 3.9 g/dL — ABNORMAL HIGH (ref 1.9–3.7)
Glucose, Bld: 122 mg/dL — ABNORMAL HIGH (ref 65–99)
Potassium: 4.3 mmol/L (ref 3.5–5.3)
Sodium: 138 mmol/L (ref 135–146)
Total Bilirubin: 0.5 mg/dL (ref 0.2–1.2)
Total Protein: 7.8 g/dL (ref 6.1–8.1)
eGFR: 119 mL/min/1.73m2

## 2024-04-14 LAB — CK: Total CK: 81 U/L (ref 20–239)

## 2024-07-13 ENCOUNTER — Ambulatory Visit: Admitting: Internal Medicine
